# Patient Record
Sex: Female | Born: 1950 | Race: White | Hispanic: No | Smoking: Current every day smoker
Health system: Southern US, Community
[De-identification: ages and names within clinical notes are randomized; demographics above are authoritative.]

## PROBLEM LIST (undated history)

## (undated) DIAGNOSIS — D649 Anemia, unspecified: Secondary | ICD-10-CM

## (undated) DIAGNOSIS — J449 Chronic obstructive pulmonary disease, unspecified: Secondary | ICD-10-CM

## (undated) DIAGNOSIS — J45909 Unspecified asthma, uncomplicated: Secondary | ICD-10-CM

## (undated) DIAGNOSIS — Z87442 Personal history of urinary calculi: Secondary | ICD-10-CM

## (undated) DIAGNOSIS — M810 Age-related osteoporosis without current pathological fracture: Secondary | ICD-10-CM

## (undated) DIAGNOSIS — M1990 Unspecified osteoarthritis, unspecified site: Secondary | ICD-10-CM

## (undated) DIAGNOSIS — R112 Nausea with vomiting, unspecified: Secondary | ICD-10-CM

## (undated) DIAGNOSIS — I10 Essential (primary) hypertension: Secondary | ICD-10-CM

## (undated) DIAGNOSIS — Z9889 Other specified postprocedural states: Secondary | ICD-10-CM

## (undated) DIAGNOSIS — E785 Hyperlipidemia, unspecified: Secondary | ICD-10-CM

## (undated) HISTORY — DX: Hyperlipidemia, unspecified: E78.5

## (undated) HISTORY — PX: TUBAL LIGATION: SHX77

## (undated) HISTORY — DX: Essential (primary) hypertension: I10

## (undated) HISTORY — DX: Age-related osteoporosis without current pathological fracture: M81.0

## (undated) HISTORY — DX: Unspecified osteoarthritis, unspecified site: M19.90

## (undated) HISTORY — PX: HERNIA REPAIR: SHX51

---

## 2009-01-26 HISTORY — PX: CARPAL TUNNEL RELEASE: SHX101

## 2013-03-21 ENCOUNTER — Ambulatory Visit: Payer: Self-pay | Admitting: Gastroenterology

## 2013-04-07 ENCOUNTER — Emergency Department (HOSPITAL_COMMUNITY)
Admission: EM | Admit: 2013-04-07 | Discharge: 2013-04-07 | Disposition: A | Discharge status: Home / Self Care | Payer: BC Managed Care – PPO | Attending: Emergency Medicine | Admitting: Emergency Medicine

## 2013-04-07 ENCOUNTER — Emergency Department (HOSPITAL_COMMUNITY): Payer: BC Managed Care – PPO

## 2013-04-07 ENCOUNTER — Encounter (HOSPITAL_COMMUNITY): Payer: Self-pay | Admitting: Emergency Medicine

## 2013-04-07 DIAGNOSIS — R221 Localized swelling, mass and lump, neck: Principal | ICD-10-CM

## 2013-04-07 DIAGNOSIS — K148 Other diseases of tongue: Secondary | ICD-10-CM | POA: Insufficient documentation

## 2013-04-07 DIAGNOSIS — R220 Localized swelling, mass and lump, head: Secondary | ICD-10-CM | POA: Insufficient documentation

## 2013-04-07 DIAGNOSIS — Z79899 Other long term (current) drug therapy: Secondary | ICD-10-CM | POA: Insufficient documentation

## 2013-04-07 DIAGNOSIS — R1310 Dysphagia, unspecified: Secondary | ICD-10-CM | POA: Insufficient documentation

## 2013-04-07 DIAGNOSIS — F17200 Nicotine dependence, unspecified, uncomplicated: Secondary | ICD-10-CM | POA: Insufficient documentation

## 2013-04-07 MED ORDER — FAMOTIDINE 20 MG PO TABS
20.0000 mg | ORAL_TABLET | Freq: Once | ORAL | Status: AC
  Administered 2013-04-07: 20 mg via ORAL
  Filled 2013-04-07: qty 1

## 2013-04-07 MED ORDER — SODIUM CHLORIDE 0.9 % IJ SOLN
INTRAMUSCULAR | Status: AC
  Filled 2013-04-07: qty 800

## 2013-04-07 MED ORDER — DIPHENHYDRAMINE HCL 25 MG PO TABS: 25.0000 mg | ORAL_TABLET | Freq: Three times a day (TID) | ORAL | Status: DC

## 2013-04-07 MED ORDER — PREDNISONE 20 MG PO TABS: 60.0000 mg | ORAL_TABLET | Freq: Every day | ORAL | Status: AC

## 2013-04-07 MED ORDER — PREDNISONE 50 MG PO TABS
60.0000 mg | ORAL_TABLET | ORAL | Status: AC
  Administered 2013-04-07: 60 mg via ORAL
  Filled 2013-04-07 (×2): qty 1

## 2013-04-07 MED ORDER — FAMOTIDINE 20 MG PO TABS: 20.0000 mg | ORAL_TABLET | Freq: Two times a day (BID) | ORAL | Status: DC

## 2013-04-07 NOTE — ED Notes (Signed)
Pt alert & oriented x4, stable gait. Patient given discharge instructions, paperwork & prescription(s). Patient  instructed to stop at the registration desk to finish any additional paperwork. Patient verbalized understanding. Pt left department w/ no further questions. 

## 2013-04-07 NOTE — Discharge Instructions (Signed)
As discussed, your evaluation here is largely reassuring.  However, with the abnormality of your CT scan it is important that you follow up with our ENT specialist for visualization and confirmation of findings.  A clear precipitant for your tongue swelling was not identified.  You may elect to use over the counter versions of the provided medication (but follow the given instructions).  Please be sure to return here for any concerning changes in your condition.

## 2013-04-07 NOTE — ED Provider Notes (Signed)
CSN: [id number]     Arrival date & time 04/07/13  1536 History  This chart was scribed for Carmin Muskrat, MD by Elby Beck, ED Scribe. This patient was seen in room APA02/APA02 and the patient's care was started at 4:07 PM.  Chief Complaint  Patient presents with  . Oral Swelling    The history is provided by the patient. No language interpreter was used.    HPI Comments: Rhonda Briggs is a 63 y.o. female who presents to the Emergency Department complaining of right sided tongue swelling onset about 3.5 hours ago, at 12:30 PM. She states that she is having slight difficulty with swallowing and with talking in association with this swelling. She states that she is not having any difficulty breathing or handling secretions. She denies any recent trauma or change in activity to have onset this tongue swelling. She denies any history of similar symptoms. She reports that no one around her has been having similar tongue swelling. She states that the only other symptom she has been having recently is itching to plantar surfaces of her bilateral feet for 2 weeks. She reports that Benadryl has been relieving this temporarily, and that she last took Benadryl a few hours ago. She states that she is otherwise healthy, and that she only takes 1 regular medication for knee pain. She denies extremity weakness, chest pain, abdominal pain, confusion or any other symptoms. She states that she is a smoker, although she denies drinking. She reports that she has no known medication or food allergies.   History reviewed. No pertinent past medical history. Past Surgical History  Procedure Laterality Date  . Tubal ligation    . Hernia repair     History reviewed. No pertinent family history. History  Substance Use Topics  . Smoking status: Current Every Day Smoker  . Smokeless tobacco: Not on file  . Alcohol Use: No   OB History   Grav Para Term Preterm Abortions TAB SAB Ect Mult Living                  Review of Systems  Constitutional:       Per HPI, otherwise negative  HENT:       Per HPI, otherwise negative  Respiratory:       Per HPI, otherwise negative  Cardiovascular:       Per HPI, otherwise negative  Gastrointestinal: Negative for vomiting.  Endocrine:       Negative aside from HPI  Genitourinary:       Neg aside from HPI   Musculoskeletal:       Per HPI, otherwise negative  Skin: Negative.   Neurological: Negative for syncope.    Allergies  Review of patient's allergies indicates no known allergies.  Home Medications   Current Outpatient Rx  Name  Route  Sig  Dispense  Refill  . diphenhydrAMINE (BENADRYL) 25 MG tablet   Oral   Take 25 mg by mouth once as needed for allergies (FOR ALLERGIC REACTION).         . Misc Natural Products (FLEX-A-MIN DOUBLE STRENGTH) TABS   Oral   Take 1 tablet by mouth daily.         . naproxen sodium (ALEVE) 220 MG tablet   Oral   Take 220 mg by mouth daily as needed (FOR PAIN).           Triage Vitals: BP 147/61  Pulse 99  Temp(Src) 98.4 F (36.9 C)  Resp 20  SpO2 99%  Physical Exam  Nursing note and vitals reviewed. Constitutional: She is oriented to person, place, and time. She appears well-developed and well-nourished. No distress.  HENT:  Head: Normocephalic and atraumatic.  Enlagrement of tongue on right side. No erythema, drainage or bleeding.  Eyes: Conjunctivae and EOM are normal.  Neck: No tracheal deviation present. No thyromegaly present.  Cardiovascular: Normal rate and regular rhythm.   Pulmonary/Chest: Effort normal and breath sounds normal. No stridor. No respiratory distress.  Abdominal: She exhibits no distension.  Musculoskeletal: She exhibits no edema.  Neurological: She is alert and oriented to person, place, and time. She has normal reflexes. No cranial nerve deficit. Coordination normal.  Normal neuro exam. Equal grip strengths.  Skin: Skin is warm and dry.  Psychiatric: She has a  normal mood and affect.    ED Course  Procedures (including critical care time)  DIAGNOSTIC STUDIES: Oxygen Saturation is 99% on RA, normal by my interpretation.    COORDINATION OF CARE: 4:12 PM- Discussed plan to order Prednisone and Pepcid. Will also order CT's of pt's head and of the soft tissue in her neck. Pt advised of plan for treatment and pt agrees.  6:30 PM- Recheck with pt, and pt reports that her tongue swelling is improving, but that it has not fully resolved. Discussed normal CT findings, with the exception of a "gray area" on the left side of her neck. Advised pt to follow-up regarding this, especially since she is a smoker.  Medications  predniSONE (DELTASONE) tablet 60 mg (60 mg Oral Given 04/07/13 1624)  famotidine (PEPCID) tablet 20 mg (20 mg Oral Given 04/07/13 1624)  sodium chloride 0.9 % injection (  Duplicate [id number])   Labs Review Labs Reviewed - No data to display Imaging Review Ct Head Wo Contrast  04/07/2013   CLINICAL DATA:  Right-sided tongue swelling, difficulty swallowing  EXAM: CT HEAD WITHOUT CONTRAST  CT NECK WITHOUT CONTRAST  TECHNIQUE: Contiguous axial images were obtained from the base of the skull through the vertex without contrast. Multidetector CT imaging of the neck was performed using the standard protocol without intravenous contrast.  COMPARISON:  None.  FINDINGS: CT HEAD FINDINGS  No acute hemorrhage, infarct, or mass lesion is identified. No midline shift. Ventricles are normal in size. Orbits and paranasal sinuses are unremarkable. No skull fracture. Mild motion artifact degrades imaging at the skullbase. Right sphenoid sinus mucous retention cyst or polyp incidentally noted.  CT NECK FINDINGS  Dental amalgam produces streak artifact. There is asymmetric fullness in the left parapharyngeal tonsillar region, image 24. No significant mass effect upon the airway. No lymphadenopathy. Thyroid demonstrates a small retroesophageal component. Mild  apical emphysematous changes are noted. No acute osseous finding, lytic, or sclerotic lesion.  IMPRESSION: No acute intracranial abnormality.  Asymmetric prominence of the left parapharyngeal tonsillar tissue which could represent adenoid hypertrophy however squamous cell carcinoma could have a similar appearance especially in a patient with a smoking history. Direct visualization is recommended. No evidence for parapharyngeal abscess.  Apical emphysematous changes.   Electronically Signed   By: Conchita Paris M.D.   On: 04/07/2013 18:05   Ct Soft Tissue Neck Wo Contrast  04/07/2013   CLINICAL DATA:  Right-sided tongue swelling, difficulty swallowing  EXAM: CT HEAD WITHOUT CONTRAST  CT NECK WITHOUT CONTRAST  TECHNIQUE: Contiguous axial images were obtained from the base of the skull through the vertex without contrast. Multidetector CT imaging of the neck was performed using the standard protocol without  intravenous contrast.  COMPARISON:  None.  FINDINGS: CT HEAD FINDINGS  No acute hemorrhage, infarct, or mass lesion is identified. No midline shift. Ventricles are normal in size. Orbits and paranasal sinuses are unremarkable. No skull fracture. Mild motion artifact degrades imaging at the skullbase. Right sphenoid sinus mucous retention cyst or polyp incidentally noted.  CT NECK FINDINGS  Dental amalgam produces streak artifact. There is asymmetric fullness in the left parapharyngeal tonsillar region, image 24. No significant mass effect upon the airway. No lymphadenopathy. Thyroid demonstrates a small retroesophageal component. Mild apical emphysematous changes are noted. No acute osseous finding, lytic, or sclerotic lesion.  IMPRESSION: No acute intracranial abnormality.  Asymmetric prominence of the left parapharyngeal tonsillar tissue which could represent adenoid hypertrophy however squamous cell carcinoma could have a similar appearance especially in a patient with a smoking history. Direct visualization  is recommended. No evidence for parapharyngeal abscess.  Apical emphysematous changes.   Electronically Signed   By: Conchita Paris M.D.   On: 04/07/2013 18:05    I spent ~5 minutes discussing smoking cessation with the patient, particularly with her abnormal oropharyngeal CT.    MDM   Final diagnoses:  Tongue swelling    I personally performed the services described in this documentation, which was scribed in my presence. The recorded information has been reviewed and is accurate.  This patient presents with asymmetric her tongue swelling, and inability to fully laterally move the tongue. Patient's presentation is unusual for angioedema, with asymmetric, and given this, and the inability to have complete range of motion of the tongue, though some concern for either cranial nerve deficit or small CVA. Patient's evaluation is largely reassuring, though there is an area of uncertain consequence on the CT scan. Patient tongue swelling decreased, and she had no new symptoms.  After a lengthy conversation with her smoking cessation, follow up instructions, return precautions, she was discharged in stable condition.    Carmin Muskrat, MD 04/07/13 [phone]

## 2013-04-07 NOTE — ED Notes (Signed)
Tongue swelling, onset 1230p .    Recent itching of feet. No new foods or meds.

## 2014-12-10 ENCOUNTER — Encounter: Payer: Self-pay | Admitting: Internal Medicine

## 2014-12-18 ENCOUNTER — Ambulatory Visit: Payer: Self-pay | Admitting: Gastroenterology

## 2015-11-04 ENCOUNTER — Ambulatory Visit (INDEPENDENT_AMBULATORY_CARE_PROVIDER_SITE_OTHER): Payer: Medicare Other | Admitting: Otolaryngology

## 2015-11-04 DIAGNOSIS — D3701 Neoplasm of uncertain behavior of lip: Secondary | ICD-10-CM | POA: Diagnosis not present

## 2015-11-04 DIAGNOSIS — J351 Hypertrophy of tonsils: Secondary | ICD-10-CM

## 2015-12-09 ENCOUNTER — Ambulatory Visit: Payer: Self-pay | Admitting: Allergy and Immunology

## 2015-12-17 ENCOUNTER — Encounter: Payer: Self-pay | Admitting: Allergy & Immunology

## 2015-12-17 ENCOUNTER — Ambulatory Visit (INDEPENDENT_AMBULATORY_CARE_PROVIDER_SITE_OTHER): Payer: Medicare Other | Admitting: Allergy & Immunology

## 2015-12-17 ENCOUNTER — Encounter (INDEPENDENT_AMBULATORY_CARE_PROVIDER_SITE_OTHER): Payer: Self-pay

## 2015-12-17 VITALS — BP 152/92 | HR 96 | Temp 97.5°F | Ht 60.0 in | Wt 141.6 lb

## 2015-12-17 DIAGNOSIS — T783XXD Angioneurotic edema, subsequent encounter: Secondary | ICD-10-CM

## 2015-12-17 DIAGNOSIS — L299 Pruritus, unspecified: Secondary | ICD-10-CM | POA: Diagnosis not present

## 2015-12-17 DIAGNOSIS — T781XXD Other adverse food reactions, not elsewhere classified, subsequent encounter: Secondary | ICD-10-CM | POA: Diagnosis not present

## 2015-12-17 LAB — CBC WITH DIFFERENTIAL/PLATELET
BASOS ABS: 0 {cells}/uL (ref 0–200)
Basophils Relative: 0 %
Eosinophils Absolute: 176 cells/uL (ref 15–500)
Eosinophils Relative: 1 %
HCT: 37.2 % (ref 35.0–45.0)
Hemoglobin: 11.5 g/dL — ABNORMAL LOW (ref 11.7–15.5)
Lymphocytes Relative: 36 %
Lymphs Abs: 6336 cells/uL — ABNORMAL HIGH (ref 850–3900)
MCH: 21.1 pg — AB (ref 27.0–33.0)
MCHC: 30.9 g/dL — AB (ref 32.0–36.0)
MCV: 67.9 fL — ABNORMAL LOW (ref 80.0–100.0)
MONOS PCT: 6 %
MPV: 7.9 fL (ref 7.5–12.5)
Monocytes Absolute: 1056 cells/uL — ABNORMAL HIGH (ref 200–950)
Neutro Abs: 10032 cells/uL — ABNORMAL HIGH (ref 1500–7800)
Neutrophils Relative %: 57 %
PLATELETS: 666 10*3/uL — AB (ref 140–400)
RBC: 5.44 MIL/uL — AB (ref 3.80–5.10)
RDW: 18.3 % — ABNORMAL HIGH (ref 11.0–15.0)
WBC: 17.6 10*3/uL — ABNORMAL HIGH (ref 3.8–10.8)

## 2015-12-17 NOTE — Patient Instructions (Addendum)
1. Angioedema with itching - Testing today had some very mild positives to barley and ginger, however I do not think that these are related to your symptoms.  - The remainder of the testing for foods was negative.  - We will get lab work to rule out serious causes of swelling: complete blood count with differential, C1 esterase studies (to rule out hereditary angioedema), inflammatory markers (to rule out rheumatologic causes of swelling), and an alpha-gal panel (to rule out alpha-gal sensitivity). - In the meantime, we will try suppressing the reaction when it start with high dose antihistamines. - When the itching starts, take Allegra (fexofenadine) tablets 1-2 tablets in the morning and Zyrtec (cetirizine) 1-2 tablets at night for ONE WEEK to prevent the progression to swelling.   2. Return in about 3 months (around 03/18/2016).  Please inform us of any Emergency Department visits, hospitalizations, or changes in symptoms. Call us before going to the ED for breathing or allergy symptoms since we might be able to fit you in for a sick visit. Feel free to contact us anytime with any questions, problems, or concerns.  It was a pleasure to meet you today! Happy Thanksgiving!   Websites that have reliable patient information: 1. American Academy of Asthma, Allergy, and Immunology: www.aaaai.org 2. Food Allergy Research and Education (FARE): foodallergy.org 3. Mothers of Asthmatics: http://www.asthmacommunitynetwork.org 4. American College of Allergy, Asthma, and Immunology: www.acaai.org

## 2015-12-17 NOTE — Addendum Note (Signed)
Addended by: Clydene Laming, AMBER L on: 12/17/2015 12:06 PM   Modules accepted: Orders

## 2015-12-17 NOTE — Progress Notes (Addendum)
NEW PATIENT  Date of Service/Encounter:  12/17/15   Assessment:   Angioedema  Adverse food reaction  Itching    Plan/Recommendations:   1. Angioedema with itching but no rash - The history is largely reassuring with the presence of itching which responds to Benadryl. - With a histaminergic process, it is odd that there is no rash with the swelling. - Testing today had some very mild positives to barley and ginger, however I do not think that these are related to your symptoms.  - The remainder of the testing for foods was negative.  - We will get lab work to rule out serious causes of swelling: complete blood count with differential, C1 esterase studies (to rule out hereditary angioedema), inflammatory markers (to rule out rheumatologic causes of swelling), and an alpha-gal panel (to rule out alpha-gal sensitivity). - There is no family history of HAE, therefore this is less likely. - Acquired angioedema is a possibility in this age range, and with her history of smoking this is something we should rule out.  - In the meantime, we will try suppressing the reaction when it start with high dose antihistamines. - When the itching starts, take Allegra (fexofenadine) tablets 1-2 tablets in the morning and Zyrtec (cetirizine) 1-2 tablets at night for ONE WEEK to prevent the progression to swelling.  - PRN use of suppressive antihistamines seems reasonable since the reactions occur only every couple of months at the most.  - Rhonda Briggs does not seem like a patient who would like to take a daily medication for her swelling episodes, therefore we compromised with the above plan.   2. Return in about 3 months (around 03/18/2016).   Subjective:   Rhonda Briggs is a 65 y.o. female presenting today for evaluation of  Chief Complaint  Patient presents with  . New Evaluation    Allergy testing  .  Rhonda Briggs has a history of the following: There are no active problems to display  for this patient.   History obtained from: chart review and patient.  Rhonda Briggs was referred by [facility].      Rhonda Briggs is a 65 y.o. female presenting for tongue swelling and itching. The patient reports that she has had these episodes off and on for 4-5 years. They will start with intense itching of her skin. Swelling will ensue thereafter and nearly every time her tongue is involved. She has gone to the ED on multiple occasions for these episodes. She is typically treated with an H2 blocker as well as an H1 blocker and steroids. She was admitted on one occasion for observation. She has never been intubated for these episodes. They always occur in the middle of the night. She denies other systemic symptoms including vomiting, abdominal pain, diarrhea, or hives. The episodes last for 1-2 days before resolving on their own. She thinks that foods are involved, although she does not have a specific food in mind. The reaction occurred once with shrimp and another time with tomato exposure. However, she has had treatment with tomato these episodes without a problem. She tries managing these at home with Benadryl, which sometimes works. She has never need epinephrine for treatment, to her best knowledge. Of note, there is no family history of unexplained abdominal pain, swelling episodes, etc that would be consistent with HAE.    Rhonda Briggs had an ED visits from March 2015 when she was treated for tongue swelling. She was treated with prednisone  and Pepcid. She had a CT of her head and neck which noted an "asymmetric prominence of the left parapharyngeal tonsillar tissue which was thought to represent adenoidal hypertrophy versus squamous cell carcinoma. Smoking cessation was encouraged and she was discharged to home.  She has seen 3 separate ENT providers on 3 separate occasions. A laryngoscopy was performed at every visit, which was unrevealing. She denies unintentional weight  loss, night sweats, or other concerns.  Rhonda Briggs otherwise does not have any atopic disease. She does endorse some nasal congestion very rarely, but she does not take any medications for it. She denies rhinorrhea, itchy watery eyes, or other allergic rhinitis symptoms. She has needed and albuterol inhaler in the past for bronchitis, but does not remember the last time that she used it. She does have a chronic nighttime cough, which she attributes to her smoking history. She has never been diagnosed with COPD. She does not have significant sputum production.  Otherwise, there is no history of other atopic diseases, including drug allergies, environmental allergies, stinging insect allergies, or urticaria. There is no significant infectious history. Vaccinations are up to date.    Past Medical History: There are no active problems to display for this patient.   Medication List:    Medication List       Accurate as of 12/17/15 11:20 AM. Always use your most recent med list.          HYDROCHLOROTHIAZIDE PO Take 2.5 mg by mouth daily.   pramipexole 0.125 MG tablet Commonly known as:  MIRAPEX Take 0.125 mg by mouth daily.       Birth History: non-contributory. Born at term without complications.   Developmental History: Rhonda Briggs has met all milestones on time. She has required no speech therapy, occupational therapy, or physical therapy.   Past Surgical History: Past Surgical History:  Procedure Laterality Date  . CARPAL TUNNEL RELEASE  2011  . HERNIA REPAIR    . TUBAL LIGATION       Family History: Family History  Problem Relation Age of Onset  . Urticaria Neg Hx   . Eczema Neg Hx   . Immunodeficiency Neg Hx   . Atopy Neg Hx   . Asthma Neg Hx   . Angioedema Neg Hx   . Allergic rhinitis Neg Hx      Social History: Rhonda Briggs lives at home with her husband. They do have two grown daughters. They live in a [place] year old home. There is hardwood throughout the home. They have  electric heating and central cooling. There are dogs outside of the home, but not inside. There are roach and mildew issues at the home. She is a smoker for 50+ years. At one point, she was smoking around one pack per day but has decreased down to one half of a pack per day.   Review of Systems: a 14-point review of systems is pertinent for what is mentioned in HPI.  Otherwise, all other systems were negative. Constitutional: negative other than that listed in the HPI Eyes: negative other than that listed in the HPI Ears, nose, mouth, throat, and face: negative other than that listed in the HPI Respiratory: negative other than that listed in the HPI Cardiovascular: negative other than that listed in the HPI Gastrointestinal: negative other than that listed in the HPI Genitourinary: negative other than that listed in the HPI Integument: negative other than that listed in the HPI Hematologic: negative other than that listed in the HPI Musculoskeletal: negative other than  that listed in the HPI Neurological: negative other than that listed in the HPI Allergy/Immunologic: negative other than that listed in the HPI    Objective:   Blood pressure (!) 152/92, pulse 96, temperature 97.5 F (36.4 C), temperature source Oral, height 5' (1.524 m), weight 141 lb 9.6 oz (64.2 kg), SpO2 96 %. Body mass index is 27.65 kg/m.   Physical Exam:  General: Alert, interactive, in no acute distress.Cooperative with the exam. Somewhat unapproachable initially, but warmed up during the visit. HEENT: TMs pearly gray, turbinates edematous and pale with clear discharge, post-pharynx erythematous. Neck: Supple without thyromegaly. Adenopathy: no enlarged lymph nodes appreciated in the anterior cervical, occipital, axillary, epitrochlear, inguinal, or popliteal regions Lungs: Clear to auscultation without wheezing, rhonchi or rales. No increased work of breathing. CV: Normal S1/S2, no murmurs. Capillary refill  <2 seconds.  Abdomen: Nondistended, nontender. No guarding or rebound tenderness. Bowel sounds faint and present in all fields  Skin: Warm and dry, without lesions or rashes. Extremities:  No clubbing, cyanosis or edema. Neuro:   Grossly intact.  Diagnostic studies:   Allergy Studies:   Entire Foods Panel: equivocal reactions to ginger and barley with adequate controls.     Salvatore Marvel, MD [facility]

## 2015-12-18 LAB — C-REACTIVE PROTEIN: CRP: 8.1 mg/L — AB (ref <8.0)

## 2015-12-18 LAB — SEDIMENTATION RATE: Sed Rate: 12 mm/hr (ref 0–30)

## 2015-12-19 LAB — TRYPTASE: TRYPTASE: 10 ug/L (ref <11)

## 2015-12-20 LAB — C1 ESTERASE INHIBITOR: C1INH SerPl-mCnc: 60 mg/dL — ABNORMAL HIGH (ref 21–39)

## 2015-12-21 LAB — COMPLEMENT COMPONENT C1Q: Complement C1Q: 7.4 mg/dL (ref 5.0–8.6)

## 2015-12-21 LAB — C1 ESTERASE INHIBITOR, FUNCTIONAL

## 2015-12-25 LAB — ALPHA-GAL PANEL
Beef IgE: <0.1 kU/L (ref <0.35)
CLASS: 0
Class: 0
Class: 0
Lamb/Mutton IgE: <0.1 kU/L (ref <0.35)

## 2015-12-30 ENCOUNTER — Other Ambulatory Visit: Payer: Self-pay | Admitting: *Deleted

## 2015-12-30 DIAGNOSIS — T783XXD Angioneurotic edema, subsequent encounter: Secondary | ICD-10-CM

## 2016-03-09 ENCOUNTER — Emergency Department (HOSPITAL_COMMUNITY): Payer: Medicare Other

## 2016-03-09 ENCOUNTER — Emergency Department (HOSPITAL_COMMUNITY)
Admission: EM | Admit: 2016-03-09 | Discharge: 2016-03-09 | Disposition: A | Discharge status: Home / Self Care | Payer: Medicare Other | Attending: Emergency Medicine | Admitting: Emergency Medicine

## 2016-03-09 ENCOUNTER — Encounter (HOSPITAL_COMMUNITY): Payer: Self-pay | Admitting: Emergency Medicine

## 2016-03-09 DIAGNOSIS — Z79899 Other long term (current) drug therapy: Secondary | ICD-10-CM | POA: Diagnosis not present

## 2016-03-09 DIAGNOSIS — M25561 Pain in right knee: Secondary | ICD-10-CM

## 2016-03-09 DIAGNOSIS — G8929 Other chronic pain: Secondary | ICD-10-CM | POA: Diagnosis not present

## 2016-03-09 DIAGNOSIS — M25531 Pain in right wrist: Secondary | ICD-10-CM

## 2016-03-09 DIAGNOSIS — F17200 Nicotine dependence, unspecified, uncomplicated: Secondary | ICD-10-CM | POA: Insufficient documentation

## 2016-03-09 DIAGNOSIS — M25562 Pain in left knee: Secondary | ICD-10-CM

## 2016-03-09 DIAGNOSIS — M170 Bilateral primary osteoarthritis of knee: Secondary | ICD-10-CM | POA: Diagnosis not present

## 2016-03-09 MED ORDER — DEXAMETHASONE SODIUM PHOSPHATE 10 MG/ML IJ SOLN
6.0000 mg | Freq: Once | INTRAMUSCULAR | Status: AC
  Administered 2016-03-09: 6 mg via INTRAMUSCULAR
  Filled 2016-03-09: qty 1

## 2016-03-09 MED ORDER — HYDROCODONE-ACETAMINOPHEN 5-325 MG PO TABS: 2.0000 | ORAL_TABLET | ORAL | 0 refills | Status: DC | PRN

## 2016-03-09 NOTE — ED Triage Notes (Signed)
Pt reports chronic right knee pain for "a while" and now having right wrist pain with no injury.

## 2016-03-09 NOTE — Discharge Instructions (Addendum)
For severe pain take norco or vicodin however realize they have the potential for addiction and it can make you sleepy and has tylenol in it.  No operating machinery while taking.  If you were given medicines take as directed.  If you are on coumadin or contraceptives realize their levels and effectiveness is altered by many different medicines.  If you have any reaction (rash, tongues swelling, other) to the medicines stop taking and see a physician.    If your blood pressure was elevated in the ER make sure you follow up for management with a primary doctor or return for chest pain, shortness of breath or stroke symptoms.  Please follow up as directed and return to the ER or see a physician for new or worsening symptoms.  Thank you. Vitals:   03/09/16 0924 03/09/16 0926  BP:  118/56  Pulse:  103  Resp:  18  Temp:  98.1 F (36.7 C)  TempSrc:  Oral  SpO2:  100%  Weight: 148 lb (67.1 kg)   Height: 5\' 1"  (1.549 m)

## 2016-03-09 NOTE — ED Provider Notes (Signed)
[name] Provider Note   CSN: [id number] Arrival date & time: 03/09/16  [id number]   By signing my name below, I, Rhonda Briggs, attest that this documentation has been prepared under the direction and in the presence of Rhonda Morrison, MD. Electronically Signed: Hilbert Briggs, Scribe. 03/09/16. 1:00 PM. History   Chief Complaint Chief Complaint  Patient presents with  . Knee Pain  . Wrist Pain     The history is provided by the patient. No language interpreter was used.   HPI Comments: Rhonda Briggs is a 66 y.o. female who presents to the Emergency Department complaining of right wrist pain that began earlier today. Patient states that she typically has chronic bilateral knee pain and has been using her wrist to hold herself up. She believes this could be the reason for her sudden onset of pain. She states that the pain in her knees are worse when standing. The left knee is worse than the right. She denies any pain when laying down. She denies fever, chills, and headache. She has a hx of arthritis which she takes celebrex.   History reviewed. No pertinent past medical history.  There are no active problems to display for this patient.   Past Surgical History:  Procedure Laterality Date  . CARPAL TUNNEL RELEASE  2011  . HERNIA REPAIR    . TUBAL LIGATION      OB History    No data available       Home Medications    Prior to Admission medications   Medication Sig Start Date End Date Taking? Authorizing Provider  celecoxib (CELEBREX) 200 MG capsule Take 100 mg by mouth daily. Take with food.   Yes Historical Provider, MD  hydrochlorothiazide (HYDRODIURIL) 25 MG tablet Take 25 mg by mouth daily.    Yes Historical Provider, MD  pramipexole (MIRAPEX) 0.125 MG tablet Take 0.25 mg by mouth at bedtime.    Yes Historical Provider, MD    Family History Family History  Problem Relation Age of Onset  . Urticaria Neg Hx   . Eczema Neg Hx   . Immunodeficiency Neg Hx    . Atopy Neg Hx   . Asthma Neg Hx   . Angioedema Neg Hx   . Allergic rhinitis Neg Hx     Social History Social History  Substance Use Topics  . Smoking status: Current Every Day Smoker  . Smokeless tobacco: Never Used  . Alcohol use No     Allergies   Patient has no known allergies.   Review of Systems Review of Systems  Constitutional: Negative for chills and fever.  Musculoskeletal: Positive for arthralgias (Left knee, right knee, and right wrist.).  Neurological: Negative for numbness and headaches.  All other systems reviewed and are negative.    Physical Exam Updated Vital Signs BP (!) 117/53 (BP Location: Right Arm)   Pulse 83   Temp 98.1 F (36.7 C) (Oral)   Resp 20   Ht 5\' 1"  (1.549 m)   Wt 148 lb (67.1 kg)   SpO2 99%   BMI 27.96 kg/m   Physical Exam  Constitutional: She is oriented to person, place, and time. She appears well-developed and well-nourished.  HENT:  Head: Normocephalic and atraumatic.  Eyes: EOM are normal.  Neck: Neck supple.  Cardiovascular: Normal rate and regular rhythm.   Pulmonary/Chest: Effort normal.  Musculoskeletal: She exhibits no deformity.  Minimal to mild joint effusion at the right and left knee. No erythema noted. Right wrist pain is  worse with extension. No significant effusion. No erythema noted. No deformities noted.  Neurological: She is alert and oriented to person, place, and time.  Skin: Skin is warm and dry.  Psychiatric: She has a normal mood and affect.  Nursing note and vitals reviewed.    ED Treatments / Results  DIAGNOSTIC STUDIES: Oxygen Saturation is 100% on RA, normal by my interpretation.    COORDINATION OF CARE: 11:50 AM Discussed treatment plan with pt at bedside, which includes left and right knee X-ray, and pt agreed to plan.  Labs (all labs ordered are listed, but only abnormal results are displayed) Labs Reviewed - No data to display  EKG  EKG Interpretation None        Radiology Dg Knee 2 Views Left  Result Date: 03/09/2016 CLINICAL DATA:  Chronic bilateral knee pain/no known injury/pt states left is worse than right EXAM: LEFT KNEE - 1-2 VIEW COMPARISON:  None. FINDINGS: Narrowing of the articular cartilage most marked in the lateral compartment with some subchondral sclerosis in the lateral femoral condyles and tibial plateau. Small subchondral cystic lucencies about the lateral and patellofemoral compartments. No fracture. No dislocation. No effusion. Normal mineralization and alignment. IMPRESSION: 1. Tricompartmental degenerative change most marked in the lateral compartment. No fracture or other acute finding Electronically Signed   By: Lucrezia Europe M.D.   On: 03/09/2016 12:38   Dg Knee 2 Views Right  Result Date: 03/09/2016 CLINICAL DATA:  Chronic bilateral knee pain/no known injury/pt states left is worse than right EXAM: RIGHT KNEE - 1-2 VIEW COMPARISON:  None. FINDINGS: No fracture. No dislocation. Small effusion in the suprapatellar bursa. Narrowing of the articular cartilage most marked the lateral compartment. Small subchondral cystic lucencies in the lateral femoral condyles, lateral tibial plateau, and about the patellofemoral joint. Normal mineralization and alignment. IMPRESSION: 1. Negative for fracture or dislocation. 2. Tricompartmental degenerative change most marked in the lateral and patellar femoral compartments. 3. Small effusion. Electronically Signed   By: Lucrezia Europe M.D.   On: 03/09/2016 12:39    Procedures Procedures (including critical care time)  Medications Ordered in ED Medications  dexamethasone (DECADRON) injection 6 mg (6 mg Intramuscular Given 03/09/16 1153)     Initial Impression / Assessment and Plan / ED Course  I have reviewed the triage vital signs and the nursing notes.  Pertinent labs & imaging results that were available during my care of the patient were reviewed by me and considered in my medical decision  making (see chart for details).   patient presents with worsening acute on chronic knee pain bilateral. Patient is waiting to get into orthopedics. Patient's had injections in the past. No sign of infection on exam. X-rays confirm worsening arthritis. Discussed outpatient follow-up.  Results and differential diagnosis were discussed with the patient/parent/guardian. Xrays were independently reviewed by myself.  Close follow up outpatient was discussed, comfortable with the plan.   Medications  dexamethasone (DECADRON) injection 6 mg (6 mg Intramuscular Given 03/09/16 1153)    Vitals:   03/09/16 0924 03/09/16 0926 03/09/16 1259  BP:  118/56 (!) 117/53  Pulse:  103 83  Resp:  18 20  Temp:  98.1 F (36.7 C) 98.1 F (36.7 C)  TempSrc:  Oral Oral  SpO2:  100% 99%  Weight: 148 lb (67.1 kg)    Height: 5\' 1"  (1.549 m)      Final diagnoses:  Acute pain of right wrist  Chronic pain of both knees  Osteoarthritis of both knees, unspecified  osteoarthritis type     Final Clinical Impressions(s) / ED Diagnoses   Final diagnoses:  Acute pain of right wrist  Chronic pain of both knees  Osteoarthritis of both knees, unspecified osteoarthritis type    New Prescriptions New Prescriptions   No medications on file     Rhonda Morrison, MD 03/09/16 1318

## 2016-03-17 ENCOUNTER — Ambulatory Visit: Payer: Medicare Other | Admitting: Allergy & Immunology

## 2016-03-25 ENCOUNTER — Ambulatory Visit (INDEPENDENT_AMBULATORY_CARE_PROVIDER_SITE_OTHER): Payer: Medicare Other | Admitting: Orthopaedic Surgery

## 2016-03-25 ENCOUNTER — Ambulatory Visit (INDEPENDENT_AMBULATORY_CARE_PROVIDER_SITE_OTHER): Payer: Medicare Other

## 2016-03-25 ENCOUNTER — Encounter: Payer: Self-pay | Admitting: Orthopaedic Surgery

## 2016-03-25 VITALS — BP 146/72 | HR 92 | Temp 97.9°F | Ht 60.0 in | Wt 148.0 lb

## 2016-03-25 DIAGNOSIS — M25531 Pain in right wrist: Secondary | ICD-10-CM

## 2016-03-25 DIAGNOSIS — G8929 Other chronic pain: Secondary | ICD-10-CM | POA: Diagnosis not present

## 2016-03-25 DIAGNOSIS — M25561 Pain in right knee: Secondary | ICD-10-CM | POA: Diagnosis not present

## 2016-03-25 DIAGNOSIS — M25562 Pain in left knee: Secondary | ICD-10-CM

## 2016-03-25 MED ORDER — HYDROCODONE-ACETAMINOPHEN 5-325 MG PO TABS: ORAL_TABLET | ORAL | 0 refills | Status: DC

## 2016-03-25 NOTE — Progress Notes (Signed)
Subjective:    Patient ID: Rhonda Briggs, female    DOB: 06-11-1950, 66 y.o.   MRN: [id number]  HPI She has long history of bilateral knee pain.  She has had viscosupplementation in the past in [place] and did well.  She recently went to [facility] in [place] and had further injections which did not help.  She has tricompartmental degenerative arthritis of the knees more laterally.  She has no trauma.  She is taking Celebrex which helps some.  Her knees are getting worse.  She has pain when getting up from a seated position and has swelling.  She is developing a knock knee appearance of the right knee.  She has no redness or any trauma.  She has recently begun to have pain of the right wrist.  She was recently seen in the ER for this.  No x-rays were done.  She has more pain in the mornings.  She has no numbness of the wrist or hand.   Review of Systems  HENT: Negative for congestion.   Respiratory: Negative for cough and shortness of breath.   Cardiovascular: Negative for chest pain and leg swelling.  Endocrine: Positive for cold intolerance.  Musculoskeletal: Positive for arthralgias, gait problem and joint swelling.  Allergic/Immunologic: Positive for environmental allergies.   Past Medical History:  Diagnosis Date  . Arthritis   . Hypertension   . Osteoporosis     Past Surgical History:  Procedure Laterality Date  . CARPAL TUNNEL RELEASE  2011  . HERNIA REPAIR    . TUBAL LIGATION      Current Outpatient Prescriptions on File Prior to Visit  Medication Sig Dispense Refill  . celecoxib (CELEBREX) 200 MG capsule Take 100 mg by mouth daily. Take with food.    . hydrochlorothiazide (HYDRODIURIL) 25 MG tablet Take 25 mg by mouth daily.     Marland Kitchen HYDROcodone-acetaminophen (NORCO) 5-325 MG tablet Take 2 tablets by mouth every 4 (four) hours as needed. 6 tablet 0  . pramipexole (MIRAPEX) 0.125 MG tablet Take 0.25 mg by mouth at bedtime.      No current facility-administered  medications on file prior to visit.     Social History   Social History  . Marital status: Married    Spouse name: N/A  . Number of children: N/A  . Years of education: N/A   Occupational History  . Not on file.   Social History Main Topics  . Smoking status: Current Every Day Smoker  . Smokeless tobacco: Never Used  . Alcohol use No  . Drug use: No  . Sexual activity: Yes    Birth control/ protection: Surgical   Other Topics Concern  . Not on file   Social History Narrative  . No narrative on file    Family History  Problem Relation Age of Onset  . Lung disease Mother   . Cancer Father   . Urticaria Neg Hx   . Eczema Neg Hx   . Immunodeficiency Neg Hx   . Atopy Neg Hx   . Asthma Neg Hx   . Angioedema Neg Hx   . Allergic rhinitis Neg Hx     BP (!) 146/72   Pulse 92   Temp 97.9 F (36.6 C)   Ht 5' (1.524 m)   Wt 148 lb (67.1 kg)   BMI 28.90 kg/m      Objective:   Physical Exam  Constitutional: She is oriented to person, place, and time. She appears well-developed and well-nourished.  HENT:  Head: Normocephalic and atraumatic.  Eyes: Conjunctivae and EOM are normal. Pupils are equal, round, and reactive to light.  Neck: Normal range of motion. Neck supple.  Cardiovascular: Normal rate, regular rhythm and intact distal pulses.   Pulmonary/Chest: Effort normal.  Abdominal: Soft.  Musculoskeletal: She exhibits tenderness (Both knees tender, right more than left, knock knee appearance of right knee, mild effusion both knees, effusion, knees stable, ROM 0-100 right; 0-105 left.  Limp right.  Right wrist tender, dorsiflexion 15, volar 25, no redness.  NV intact.).  Neurological: She is alert and oriented to person, place, and time. She displays normal reflexes. No cranial nerve deficit. She exhibits normal muscle tone. Coordination normal.  Skin: Skin is warm and dry.  Psychiatric: She has a normal mood and affect. Her behavior is normal. Judgment and thought  content normal.  Vitals reviewed.   X-rays of the right wrist were done, reported separately.      Assessment & Plan:   Encounter Diagnoses  Name Primary?  . Right wrist pain Yes  . Chronic pain of right knee   . Chronic pain of left knee    I will have her see Dr. Aline Brochure for possible surgical evaluation.  She understands and agrees.  She is to continue her Celebrex.  She should get a cane or crutch.  Call if any problem.  Precautions given.  She already has a cock-up splint for the wrist.  I will give some pain medicine.  I have reviewed the [facility] web site prior to prescribing narcotic medicine for this patient.  Electronically Signed Sanjuana Kava, MD 2/28/20182:37 PM

## 2016-03-26 ENCOUNTER — Telehealth: Payer: Self-pay

## 2016-03-26 NOTE — Telephone Encounter (Signed)
Dr Luna Glasgow is sending this pt to you for TKA surgery.  When would you like to schedule this patient?

## 2016-03-27 NOTE — Telephone Encounter (Signed)
23, 26, 27 of march look open

## 2016-04-17 ENCOUNTER — Ambulatory Visit (INDEPENDENT_AMBULATORY_CARE_PROVIDER_SITE_OTHER): Payer: Medicare Other

## 2016-04-17 ENCOUNTER — Ambulatory Visit (INDEPENDENT_AMBULATORY_CARE_PROVIDER_SITE_OTHER): Payer: Medicare Other | Admitting: Orthopedic Surgery

## 2016-04-17 VITALS — BP 135/89 | HR 124 | Ht 61.0 in | Wt 145.0 lb

## 2016-04-17 DIAGNOSIS — M170 Bilateral primary osteoarthritis of knee: Secondary | ICD-10-CM

## 2016-04-17 DIAGNOSIS — M25562 Pain in left knee: Secondary | ICD-10-CM

## 2016-04-17 DIAGNOSIS — M25561 Pain in right knee: Secondary | ICD-10-CM

## 2016-04-17 DIAGNOSIS — F17219 Nicotine dependence, cigarettes, with unspecified nicotine-induced disorders: Secondary | ICD-10-CM

## 2016-04-17 DIAGNOSIS — G8929 Other chronic pain: Secondary | ICD-10-CM

## 2016-04-17 MED ORDER — NICOTINE 14 MG/24HR TD PT24: 14.0000 mg | MEDICATED_PATCH | Freq: Every day | TRANSDERMAL | 0 refills | Status: DC

## 2016-04-17 NOTE — Patient Instructions (Addendum)
You have decided to proceed with knee replacement surgery. You have decided not to continue with nonoperative measures such as but not limited to oral medication, weight loss, activity modification, physical therapy, bracing, or injection.  We will perform the procedure commonly known as total knee replacement. Some of the risks associated with knee replacement surgery include but are not limited to Bleeding Infection Swelling Stiffness Blood clot Pain that persists even after surgery  Infection is especially devastating complication of knee surgery although rare. If infection does occur your implant will usually have to be removed and several surgeries and antibiotics will be needed to eradicate the infection prior to performing a repeat replacement.   In some cases amputation is required to eradicate the infection. In other rare cases a knee fusion is needed    If you're not comfortable with these risks and would like to continue with nonoperative treatment please let Dr. Aline Brochure know prior to your surgery.   STOP SMOKING !  RISKS OF COMPLICATIONS INCREASE WITH SMOKING    Health Risks of Smoking Smoking cigarettes is very bad for your health. Tobacco smoke has over 200 known poisons in it. It contains the poisonous gases nitrogen oxide and carbon monoxide. There are over 60 chemicals in tobacco smoke that cause cancer. Smoking is difficult to quit because a chemical in tobacco, called nicotine, causes addiction or dependence. When you smoke and inhale, nicotine is absorbed rapidly into the bloodstream through your lungs. Both inhaled and non-inhaled nicotine may be addictive. What are the risks of cigarette smoke? Cigarette smokers have an increased risk of many serious medical problems, including:  Lung cancer.  Lung disease, such as pneumonia, bronchitis, and emphysema.  Chest pain (angina) and heart attack because the heart is not getting enough oxygen.  Heart disease and  peripheral blood vessel disease.  High blood pressure (hypertension).  Stroke.  Oral cancer, including cancer of the lip, mouth, or voice box.  Bladder cancer.  Pancreatic cancer.  Cervical cancer.  Pregnancy complications, including premature birth.  Stillbirths and smaller newborn babies, birth defects, and genetic damage to sperm.  Early menopause.  Lower estrogen level for women.  Infertility.  Facial wrinkles.  Blindness.  Increased risk of broken bones (fractures).  Senile dementia.  Stomach ulcers and internal bleeding.  Delayed wound healing and increased risk of complications during surgery.  Even smoking lightly shortens your life expectancy by several years. Because of secondhand smoke exposure, children of smokers have an increased risk of the following:  Sudden infant death syndrome (SIDS).  Respiratory infections.  Lung cancer.  Heart disease.  Ear infections. What are the benefits of quitting? There are many health benefits of quitting smoking. Here are some of them:  Within days of quitting smoking, your risk of having a heart attack decreases, your blood flow improves, and your lung capacity improves. Blood pressure, pulse rate, and breathing patterns start returning to normal soon after quitting.  Within months, your lungs may clear up completely.  Quitting for 10 years reduces your risk of developing lung cancer and heart disease to almost that of a nonsmoker.  People who quit may see an improvement in their overall quality of life. How do I quit smoking? Smoking is an addiction with both physical and psychological effects, and longtime habits can be hard to change. Your health care provider can recommend:  Programs and community resources, which may include group support, education, or talk therapy.  Prescription medicines to help reduce cravings.  Nicotine  replacement products, such as patches, gum, and nasal sprays. Use these  products only as directed. Do not replace cigarette smoking with electronic cigarettes, which are commonly called e-cigarettes. The safety of e-cigarettes is not known, and some may contain harmful chemicals.  A combination of two or more of these methods. Where to find more information:  American Lung Association: www.lung.org  American Cancer Society: www.cancer.org Summary  Smoking cigarettes is very bad for your health. Cigarette smokers have an increased risk of many serious medical problems, including several cancers, heart disease, and stroke.  Smoking is an addiction with both physical and psychological effects, and longtime habits can be hard to change.  By stopping right away, you can greatly reduce the risk of medical problems for you and your family.  To help you quit smoking, your health care provider can recommend programs, community resources, prescription medicines, and nicotine replacement products such as patches, gum, and nasal sprays. This information is not intended to replace advice given to you by your health care provider. Make sure you discuss any questions you have with your health care provider. Document Released: 02/20/2004 Document Revised: 01/17/2016 Document Reviewed: 01/17/2016 Elsevier Interactive Patient Education  2017 [employer].  Steps to Quit Smoking Smoking tobacco can be harmful to your health and can affect almost every organ in your body. Smoking puts you, and those around you, at risk for developing many serious chronic diseases. Quitting smoking is difficult, but it is one of the best things that you can do for your health. It is never too late to quit. What are the benefits of quitting smoking? When you quit smoking, you lower your risk of developing serious diseases and conditions, such as:  Lung cancer or lung disease, such as COPD.  Heart disease.  Stroke.  Heart attack.  Infertility.  Osteoporosis and bone  fractures. Additionally, symptoms such as coughing, wheezing, and shortness of breath may get better when you quit. You may also find that you get sick less often because your body is stronger at fighting off colds and infections. If you are pregnant, quitting smoking can help to reduce your chances of having a baby of low birth weight. How do I get ready to quit? When you decide to quit smoking, create a plan to make sure that you are successful. Before you quit:  Pick a date to quit. Set a date within the next two weeks to give you time to prepare.  Write down the reasons why you are quitting. Keep this list in places where you will see it often, such as on your bathroom mirror or in your car or wallet.  Identify the people, places, things, and activities that make you want to smoke (triggers) and avoid them. Make sure to take these actions:  Throw away all cigarettes at home, at work, and in your car.  Throw away smoking accessories, such as Scientist, research (medical).  Clean your car and make sure to empty the ashtray.  Clean your home, including curtains and carpets.  Tell your family, friends, and coworkers that you are quitting. Support from your loved ones can make quitting easier.  Talk with your health care provider about your options for quitting smoking.  Find out what treatment options are covered by your health insurance. What strategies can I use to quit smoking? Talk with your healthcare provider about different strategies to quit smoking. Some strategies include:  Quitting smoking altogether instead of gradually lessening how much you smoke over a  period of time. Research shows that quitting "cold [place]" is more successful than gradually quitting.  Attending in-person counseling to help you build problem-solving skills. You are more likely to have success in quitting if you attend several counseling sessions. Even short sessions of 10 minutes can be effective.  Finding  resources and support systems that can help you to quit smoking and remain smoke-free after you quit. These resources are most helpful when you use them often. They can include:  Online chats with a Social worker.  Telephone quitlines.  Printed Furniture conservator/restorer.  Support groups or group counseling.  Text messaging programs.  Mobile phone applications.  Taking medicines to help you quit smoking. (If you are pregnant or breastfeeding, talk with your health care provider first.) Some medicines contain nicotine and some do not. Both types of medicines help with cravings, but the medicines that include nicotine help to relieve withdrawal symptoms. Your health care provider may recommend:  Nicotine patches, gum, or lozenges.  Nicotine inhalers or sprays.  Non-nicotine medicine that is taken by mouth. Talk with your health care provider about combining strategies, such as taking medicines while you are also receiving in-person counseling. Using these two strategies together makes you more likely to succeed in quitting than if you used either strategy on its own. If you are pregnant or breastfeeding, talk with your health care provider about finding counseling or other support strategies to quit smoking. Do not take medicine to help you quit smoking unless told to do so by your health care provider. What things can I do to make it easier to quit? Quitting smoking might feel overwhelming at first, but there is a lot that you can do to make it easier. Take these important actions:  Reach out to your family and friends and ask that they support and encourage you during this time. Call telephone quitlines, reach out to support groups, or work with a counselor for support.  Ask people who smoke to avoid smoking around you.  Avoid places that trigger you to smoke, such as bars, parties, or smoke-break areas at work.  Spend time around people who do not smoke.  Lessen stress in your life, because  stress can be a smoking trigger for some people. To lessen stress, try:  Exercising regularly.  Deep-breathing exercises.  Yoga.  Meditating.  Performing a body scan. This involves closing your eyes, scanning your body from head to toe, and noticing which parts of your body are particularly tense. Purposefully relax the muscles in those areas.  Download or purchase mobile phone or tablet apps (applications) that can help you stick to your quit plan by providing reminders, tips, and encouragement. There are many free apps, such as QuitGuide from the [employer] Office manager for Disease Control and Prevention). You can find other support for quitting smoking (smoking cessation) through smokefree.gov and other websites. How will I feel when I quit smoking? Within the first 24 hours of quitting smoking, you may start to feel some withdrawal symptoms. These symptoms are usually most noticeable 2-3 days after quitting, but they usually do not last beyond 2-3 weeks. Changes or symptoms that you might experience include:  Mood swings.  Restlessness, anxiety, or irritation.  Difficulty concentrating.  Dizziness.  Strong cravings for sugary foods in addition to nicotine.  Mild weight gain.  Constipation.  Nausea.  Coughing or a sore throat.  Changes in how your medicines work in your body.  A depressed mood.  Difficulty sleeping (insomnia). After the first  2-3 weeks of quitting, you may start to notice more positive results, such as:  Improved sense of smell and taste.  Decreased coughing and sore throat.  Slower heart rate.  Lower blood pressure.  Clearer skin.  The ability to breathe more easily.  Fewer sick days. Quitting smoking is very challenging for most people. Do not get discouraged if you are not successful the first time. Some people need to make many attempts to quit before they achieve long-term success. Do your best to stick to your quit plan, and talk with your health  care provider if you have any questions or concerns. This information is not intended to replace advice given to you by your health care provider. Make sure you discuss any questions you have with your health care provider. Document Released: 01/06/2001 Document Revised: 09/10/2015 Document Reviewed: 05/29/2014 Elsevier Interactive Patient Education  2017 [employer].

## 2016-04-17 NOTE — Progress Notes (Signed)
Patient ID: Rhonda Briggs, female   DOB: 1950-12-15, 66 y.o.   MRN: [id number]  Chief Complaint  Patient presents with  . Knee Pain    Referral from Dr. Luna Glasgow for surgery.    HPI Rhonda Briggs is a 66 y.o. female.   Patient presents for possible knee replacement  The patient has had pain in her knees for several years. She was initially treated with physical supplementation did well then went to [facility] clinic in [place] had injections which did not help  Her main trouble is pain Celebrex does not relieve the pain her pain is getting worse most of her pain comes from getting out of a seated position and it is associated with some swelling it is bilateral pain  She had an acute episode of severe body ache treated with steroid injection and Percocet on the 19th.  She does not have any back pain or radicular symptoms Review of Systems Review of Systems  Respiratory: Positive for shortness of breath.   Cardiovascular: Negative for chest pain.  Musculoskeletal: Positive for arthralgias and myalgias. Negative for back pain.   (2 MINIMUM)  Past Medical History:  Diagnosis Date  . Arthritis   . Hypertension   . Osteoporosis     Past Surgical History:  Procedure Laterality Date  . CARPAL TUNNEL RELEASE  2011  . HERNIA REPAIR    . TUBAL LIGATION      Social History Social History  Substance Use Topics  . Smoking status: Current Every Day Smoker  . Smokeless tobacco: Never Used  . Alcohol use No    No Known Allergies  No outpatient prescriptions have been marked as taking for the 04/17/16 encounter (Office Visit) with Carole Civil, MD.      Physical Exam Physical Exam 1.BP 135/89   Pulse (!) 124   Ht 5\' 1"  (1.549 m)   Wt 145 lb (65.8 kg)   BMI 27.40 kg/m   2. Gen. appearance. The patient is well-developed and well-nourished, grooming and hygiene are normal. There are no gross congenital abnormalities  3. The patient is alert and oriented to person  place and time  4. Mood and affect are normal  5. Ambulation MILD LIMP   Examination reveals the following: 6. On inspection we find that the right knee and the left knee medial and lateral joint line tenderness  7. With the range of motion of  left knee 125 right knee 120 with 5 flexion contracture in each  8. Stability tests were normal  in each knee  9. Strength tests revealed grade 5 motor strength in each quad  10. Skin we find no rash ulceration or erythema in the right and left leg  11. Sensation remains intact right and left leg  12 Vascular system shows no peripheral edema right and left leg    MEDICAL DECISION MAKING:    Data Reviewed Initial plain films show bilateral symmetric to slightly more lateral compartment disease  Patellofemoral x-rays today which were not included in the first series of films show osteopenia with degenerative arthrosis  Assessment Encounter Diagnoses  Name Primary?  . Chronic pain of left knee   . Chronic pain of right knee   . Primary osteoarthritis of both knees Yes  . Cigarette nicotine dependence with nicotine-induced disorder      Plan  Right total knee. We will wait until 30 days passed from the steroid use and she will also attempt smoking cessation This procedure has been fully reviewed  with the patient and written informed consent has been obtained.    Arther Abbott 04/17/2016, 9:53 AM

## 2016-04-17 NOTE — Addendum Note (Signed)
Addended by: Baldomero Lamy B on: 04/17/2016 10:52 AM   Modules accepted: Orders, SmartSet

## 2016-04-20 ENCOUNTER — Telehealth: Payer: Self-pay | Admitting: Orthopedic Surgery

## 2016-04-20 ENCOUNTER — Other Ambulatory Visit: Payer: Self-pay | Admitting: Orthopedic Surgery

## 2016-04-20 MED ORDER — HYDROCODONE-ACETAMINOPHEN 5-325 MG PO TABS: ORAL_TABLET | ORAL | 0 refills | Status: DC

## 2016-04-20 NOTE — Telephone Encounter (Signed)
ready

## 2016-04-20 NOTE — Progress Notes (Unsigned)
[place] controlled substance reporting system reviewed  

## 2016-04-20 NOTE — Telephone Encounter (Signed)
Routing to Dr Harrison 

## 2016-04-20 NOTE — Telephone Encounter (Signed)
Patient left message on voicemail to ask Dr. Aline Brochure if he would prescribe her something for pain. She thought he had told her he was going to send something in to her pharmacy along with the nicotine patch.  Please advise.

## 2016-04-20 NOTE — Telephone Encounter (Signed)
She said she had hydrocodone   If not I will prescribe some for her

## 2016-04-20 NOTE — Telephone Encounter (Signed)
PLEASE ADVISE.

## 2016-04-20 NOTE — Telephone Encounter (Signed)
Patient states she does not have any and would like a prescription

## 2016-04-30 ENCOUNTER — Other Ambulatory Visit: Payer: Self-pay | Admitting: *Deleted

## 2016-04-30 DIAGNOSIS — Z96651 Presence of right artificial knee joint: Secondary | ICD-10-CM

## 2016-05-04 ENCOUNTER — Telehealth: Payer: Self-pay | Admitting: Orthopedic Surgery

## 2016-05-04 NOTE — Telephone Encounter (Signed)
HOLD ON SCHEDULING UNTIL THIS CAN BE DONE

## 2016-05-04 NOTE — Telephone Encounter (Signed)
Patient stated that she is having knee surgery on 4/19 and her husband was not able to look after her after surgery because of health problems. She stated that she had spoken with Medicare and was informed that her doctor would need to send a pre-authorization and reason why she would need rehab and home health.  Please advise

## 2016-05-04 NOTE — Telephone Encounter (Signed)
SHORT STAY ADVISED TO CANCEL SURGERY FOR TIME BEING, PATIENT AWARE  JUST SENDING BACK TO YOU AS A REMINDER

## 2016-05-04 NOTE — Telephone Encounter (Signed)
DO WE DO THIS? I THOUGHT THIS WAS HANDLED AT TIME OF SURGERY

## 2016-05-08 ENCOUNTER — Other Ambulatory Visit (HOSPITAL_COMMUNITY): Payer: Medicare Other

## 2016-05-14 ENCOUNTER — Inpatient Hospital Stay: Admit: 2016-05-14 | Payer: Medicare Other | Admitting: Orthopedic Surgery

## 2016-05-14 SURGERY — ARTHROPLASTY, KNEE, TOTAL
Anesthesia: Spinal | Laterality: Right

## 2016-05-18 ENCOUNTER — Telehealth: Payer: Self-pay | Admitting: Orthopedic Surgery

## 2016-05-18 NOTE — Telephone Encounter (Signed)
Patient called wanting to speak with you in reference to rescheduling her surgery.

## 2016-05-18 NOTE — Telephone Encounter (Signed)
DO WE HAVE AN ANSWER REGARDING TO PRE AUTHORIZATION OF REHAB PRIOR TO SURGERY  * SEE 05/04/16 NOTE

## 2016-05-18 NOTE — Telephone Encounter (Signed)
No I dont and I have requested info from our [facility] waiting for their response

## 2016-05-20 NOTE — Telephone Encounter (Signed)
Returned call, no answer.

## 2016-05-20 NOTE — Telephone Encounter (Signed)
Patient aware.

## 2016-05-27 ENCOUNTER — Encounter: Payer: Self-pay | Admitting: Orthopedic Surgery

## 2016-05-27 NOTE — Progress Notes (Signed)
Girard Cooter, RN  Carole Civil, MD        She would need a 3 day INPATIENT stay and a SNF recommendation from PT. If after surgery she doesn't meet SNF criteria with PT her medicare won't pay for it. If she doesn't have the PT recommendation after surgery she would have the option of paying privately for SNF which is $6-8K per month.   When is her surgery?   Previous Messages    ----- Message -----  From: Carole Civil, MD  Sent: 05/22/2016  8:07 AM  To: Girard Cooter, RN  Subject: POST OP DISCHRGE SERVICES             WILL SHE QUALIFY FOR REHAB POST OP [facility]KA ?

## 2016-05-29 ENCOUNTER — Ambulatory Visit: Payer: Medicare Other | Admitting: Orthopedic Surgery

## 2016-06-04 ENCOUNTER — Other Ambulatory Visit: Payer: Self-pay | Admitting: *Deleted

## 2016-06-04 ENCOUNTER — Telehealth: Payer: Self-pay | Admitting: Orthopedic Surgery

## 2016-06-04 DIAGNOSIS — Z96651 Presence of right artificial knee joint: Secondary | ICD-10-CM

## 2016-06-04 NOTE — Telephone Encounter (Signed)
Scheduled MAY 23

## 2016-06-04 NOTE — Telephone Encounter (Signed)
Patient wants to know when her surgery is going to be scheduled.  Please call her

## 2016-06-09 NOTE — Patient Instructions (Signed)
Rhonda Briggs  06/09/2016     @PREFPERIOPPHARMACY @   Your procedure is scheduled on  06/17/2016   Report to [facility] at  700  A.M.  Call this number if you have problems the morning of surgery:  [phone]   Remember:  Do not eat food or drink liquids after midnight.  Take these medicines the morning of surgery with A SIP OF WATER  Clebrex, hydrocodone.   Do not wear jewelry, make-up or nail polish.  Do not wear lotions, powders, or perfumes, or deoderant.  Do not shave 48 hours prior to surgery.  Men may shave face and neck.  Do not bring valuables to the hospital.  [facility] is not responsible for any belongings or valuables.  Contacts, dentures or bridgework may not be worn into surgery.  Leave your suitcase in the car.  After surgery it may be brought to your room.  For patients admitted to the hospital, discharge time will be determined by your treatment team.  Patients discharged the day of surgery will not be allowed to drive home.   Name and phone number of your driver:   family Special instructions:  None  Please read over the following fact sheets that you were given. Anesthesia Post-op Instructions and Care and Recovery After Surgery       Total Knee Replacement Total knee replacement is a procedure to replace the knee joint with an artificial (prosthetic) knee joint. The purpose of this surgery is to reduce knee pain and improve knee function. The prosthetic knee joint (prosthesis) is usually made of metal and plastic. It replaces parts of the thigh bone (femur), lower leg bone (tibia), and kneecap (patella) that are removed during the procedure. Tell a health care provider about:  Any allergies you have.  All medicines you are taking, including vitamins, herbs, eye drops, creams, and over-the-counter medicines.  Any problems you or family members have had with anesthetic medicines.  Any blood disorders you have.  Any  surgeries you have had.  Any medical conditions you have.  Whether you are pregnant or may be pregnant. What are the risks? Generally, this is a safe procedure. However, problems may occur, including:  Infection.  Bleeding.  Allergic reactions to medicines.  Damage to other structures or organs.  Decreased range of motion of the knee.  Instability of the knee.  Loosening of the prosthetic joint.  Knee pain that does not go away (chronic pain). What happens before the procedure?  Ask your health care provider about:  Changing or stopping your regular medicines. This is especially important if you are taking diabetes medicines or blood thinners.  Taking medicines such as aspirin and ibuprofen. These medicines can thin your blood. Do not take these medicines before your procedure if your health care provider instructs you not to.  Have dental care and routine cleanings completed before your procedure. Plan to not have dental work done for 3 months after your procedure. Germs from anywhere in your body, including your mouth, can travel to your new joint and infect it.  Follow instructions from your health care provider about eating or drinking restrictions.  Ask your health care provider how your surgical site will be marked or identified.  You may be given antibiotic medicine to help prevent infection.  If your health care provider prescribes physical therapy, do exercises as instructed.  Do not use any tobacco products, such  as cigarettes, chewing tobacco, or e-cigarettes. If you need help quitting, ask your health care provider.  You may have a physical exam.  You may have tests, such as:  X-rays.  MRI.  CT scan.  Bone scans.  You may have a blood or urine sample taken.  Plan to have someone take you home after the procedure.  If you will be going home right after the procedure, plan to have someone with you for at least 24 hours. It is recommended that you  have someone to help care for you for at least 4-6 weeks after your procedure. What happens during the procedure?  To reduce your risk of infection:  Your health care team will wash or sanitize their hands.  Your skin will be washed with soap.  An IV tube will be inserted into one of your veins.  You will be given one or more of the following:  A medicine to help you relax (sedative).  A medicine to numb the area (local anesthetic).  A medicine to make you fall asleep (general anesthetic).  A medicine that is injected into your spine to numb the area below and slightly above the injection site (spinal anesthetic).  A medicine that is injected into an area of your body to numb everything below the injection site (regional anesthetic).  An incision will be made in your knee.  Damaged cartilage and bone will be removed from your femur, tibia, and patella.  Parts of the prosthesis (liners)will be placed over the areas of bone and cartilage that were removed. A metal liner will be placed over your femur, and plastic liners will be placed over your tibia and the underside of your patella.  One or more small tubes (drains) may be placed near your incision to help drain extra fluid from your surgical site.  Your incision will be closed with stitches (sutures), skin glue, or adhesive strips. Medicine may be applied to your incision.  A bandage (dressing) will be placed over your incision. The procedure may vary among health care providers and hospitals. What happens after the procedure?  Your blood pressure, heart rate, breathing rate, and blood oxygen level will be monitored often until the medicines you were given have worn off.  You may continue to receive fluids and medicines through an IV tube.  You will have some pain. Pain medicines will be available to help you.  You may have fluid coming from one or more drains in your incision.  You may have to wear compression  stockings. These stockings help to prevent blood clots and reduce swelling in your legs.  You will be encouraged to move around as much as possible.  You may be given a continuous passive motion machine to use at home. You will be shown how to use this machine.  Do not drive for 24 hours if you received a sedative. This information is not intended to replace advice given to you by your health care provider. Make sure you discuss any questions you have with your health care provider. Document Released: 04/20/2000 Document Revised: 09/16/2015 Document Reviewed: 12/19/2014 Elsevier Interactive Patient Education  2017 [place].  Total Knee Replacement, Care After These instructions give you information about caring for yourself after your procedure. Your doctor may also give you more specific instructions. Call your doctor if you have any problems or questions after your procedure. Follow these instructions at home: Medicines   Take over-the-counter and prescription medicines only as told by your  doctor.  If you were prescribed an antibiotic medicine, take it as told by your doctor. Do not stop taking the antibiotic even if you start to feel better.  If you were prescribed a blood thinner (anticoagulant), take it as told by your doctor. If you have a splint or brace:   Wear the splint or brace as told by your doctor. Remove it only as told by your doctor.  Loosen the splint or brace if your toes tingle, get numb, or turn cold and blue.  Do not let your splint or brace get wet if it is not waterproof.  Keep the splint or brace clean. Bathing    Do not take baths, swim, or use a hot tub until your doctor says it is okay. Ask your doctor if you can take showers. You may only be allowed to take sponge baths for bathing.  If you have a splint or brace that is not waterproof, cover it with a watertight covering when you take a bath or a shower.  Keep your bandage (dressing) dry until  your doctor says it can be taken off. Incision care and drain care   Check your cut from surgery (incision) and your drain every day for signs of infection. Check for:  More redness, swelling, or pain.  More fluid or blood.  Warmth.  Pus or a bad smell.  Follow instructions from your doctor about how to take care of your cut from surgery. Make sure you:  Wash your hands with soap and water before you change your bandage. If you cannot use soap and water, use hand sanitizer.  Change your bandage as told by your doctor.  Leave stitches (sutures), skin glue, or skin tape (adhesive) strips in place. They may need to stay in place for 2 weeks or longer. If tape strips get loose and curl up, you may trim the loose edges. Do not remove tape strips completely unless your doctor says it is okay.  If you have a drain, follow instructions from your doctor about caring for it. Do not remove the drain tube or any bandages unless your doctor says it is okay. Managing pain, stiffness, and swelling    If directed, put ice on your knee.  Put ice in a plastic bag.  Place a towel between your skin and the bag.  Leave the ice on for 20 minutes, 2-3 times per day.  If directed, apply heat to the affected area as often as told by your doctor. Use the heat source that your doctor recommends, such as a moist heat pack or a heating pad.  Place a towel between your skin and the heat source.  Leave the heat on for 20-30 minutes.  Remove the heat if your skin turns bright red. This is especially important if you are unable to feel pain, heat, or cold. You may have a greater risk of getting burned.  Move your toes often to avoid stiffness and to lessen swelling.  Raise (elevate) your knee above the level of your heart while you are sitting or lying down.  Wear elastic knee support for as long as told by your doctor. Driving    Do not drive until your doctor says it is okay. Ask your doctor when  it is safe to drive if you have a splint or brace on your knee.  Do not drive or use heavy machinery while taking prescription pain medicine.  Do not drive for 24 hours if you received  a sedative. Activity   Do not lift anything that is heavier than 10 lb (4.5 kg) until your doctor says it is okay.  Do not play contact sports until your doctor says it is okay.  Avoid high-impact activities, including running, jumping rope, and jumping jacks.  Avoid sitting for a long time without moving. Get up and move around at least every few hours.  If physical therapy was prescribed, do exercises as told by your doctor.  Return to your normal activities as told by your doctor. Ask your doctor what activities are safe for you. Safety   Do not use your leg to support your body weight until your doctor says that you can. Use crutches or a walker as told by your doctor. General instructions    Do not have any dental work done for at least 3 months after your surgery. When you do have dental work done, tell your dentist about your joint replacement.  Do not use any tobacco products, such as cigarettes, chewing tobacco, or e-cigarettes. If you need help quitting, ask your doctor.  Wear special socks (compression stockings) as told by your doctor.  If you have been sent home with a knee joint motion machine (continuous passive motion machine), use it as told by your doctor.  Drink enough fluid to keep your pee (urine) clear or pale yellow.  If you have been told to lose weight, follow instructions from your doctor about how to do this safely.  Keep all follow-up visits as told by your doctor. This is important. Contact a doctor if:  You have more redness, swelling, or pain around your cut from surgery or your drain.  You have more fluid or blood coming from your cut from surgery or your drain.  Your cut from surgery or your drain area feels warm to the touch.  You have pus or a bad smell  coming from your cut from surgery or your drain.  You have a fever.  Your cut breaks open after your doctor removes your stitches, skin glue, or skin tape strips.  Your new joint feels loose.  You have knee pain that does not go away. Get help right away if:  You have a rash.  You have pain in your calf or thigh.  You have swelling in your calf or thigh.  You have shortness of breath.  You have trouble breathing.  You have chest pain.  Your ability to move your knee is getting worse. This information is not intended to replace advice given to you by your health care provider. Make sure you discuss any questions you have with your health care provider. Document Released: 04/06/2011 Document Revised: 09/16/2015 Document Reviewed: 12/19/2014 Elsevier Interactive Patient Education  2017 [place].  Spinal Anesthesia and Epidural Anesthesia Spinal anesthesia and epidural anesthesia are methods of numbing the body. They are done by injecting numbing medicine (anesthetic) into the back, near the spinal cord. Spinal anesthesia is usually done to numb an area at and below the place where the injection is made. It is often used during surgeries of the pelvis, hips, legs, and lower abdomen. It begins to work almost immediately. Epidural anesthesia may be done to numb an area above or below the area where the injection is made. It is often used during childbirth and after major abdominal or chest surgery. It begins to work after 10-20 minutes. Tell a health care provider about:  Any allergies you have.  All medicines you are taking,  including vitamins, herbs, eye drops, creams, and over-the-counter medicines.  Any problems you or family members have had with anesthetic medicines.  Any blood disorders you have.  Any surgeries you have had.  Any medical conditions you have.  Whether you are pregnant or may be pregnant.  Any recent alcohol, tobacco, or drug use. What are the  risks? Generally, this is a safe procedure. However, problems may occur, including:  Headache.  A drop in blood pressure. In some cases, this can lead to a heart attack or a stroke.  Nerve damage.  Infection.  Allergic reaction to medicines.  Seizures.  Spinal fluid leak.  Bleeding around the injection site.  Inability to breathe. If this happens, a tube may be put into your windpipe (trachea) and a machine may be used to help you breathe until the anesthetic wears off.  Long-lasting numbness, pain, or loss of function of body parts.  Nausea and vomiting.  Dizziness and fainting.  Shivering.  Itching. What happens before the procedure? Staying hydrated  Follow instructions from your health care provider about hydration, which may include:  Up to 2 hours before the procedure - you may continue to drink clear liquids, such as water, clear fruit juice, black coffee, and plain tea. Eating and drinking restrictions  Follow instructions from your health care provider about eating and drinking, which may include:  8 hours before the procedure - stop eating heavy meals or foods such as meat, fried foods, or fatty foods.  6 hours before the procedure - stop eating light meals or foods, such as toast or cereal.  6 hours before the procedure - stop drinking milk or drinks that contain milk.  2 hours before the procedure - stop drinking clear liquids. Medicine  Ask your health care provider about:  Changing or stopping your regular medicines. This is especially important if you are taking diabetes medicines or blood thinners.  Changing or stopping your dietary supplements.  Taking medicines such as aspirin and ibuprofen. These medicines can thin your blood. Do not take these medicines before your procedure if your health care provider instructs you not to. General instructions    Do not use any tobacco products, such as cigarettes, chewing tobacco, and electronic  cigarettes, for as long as possible before your procedure. If you need help quitting, ask your health care provider.  Ask your health care provider if you will have to stay overnight at the hospital or clinic.  If you will not have to stay overnight:  Plan to have someone take you home from the hospital or clinic.  Plan to have someone with you for 24 hours. What happens during the procedure?  A health care provider will put patches on your chest, a cuff around your arm, or a sensor device on your finger. These will be attached to monitors that allow your health care provider to watch your blood pressure, pulse, and oxygen levels to make sure that the anesthetic does not cause any problems.  An IV tube may be inserted into one of your veins. The tube will be used to give you fluids and medicines throughout the procedure as needed.  You may be given a medicine to help you relax (sedative).  You will be asked to sit up or to lie on your side with your knees and your chin bent toward your chest. These positions open up the space between the bones in your back, making it easier to inject the medicine.  The area  of your back where the medicine will be injected will be cleaned.  A medicine called a local anesthetic may be injected to numb the area where the spinal or epidural anesthetic will be injected.  A needle will be inserted between the bones of your back. While this is being done:  Continue to breathe normally.  Stay as still and quiet as you can.  If you feel a tingling shock or pain going down your leg, tell your health care provider but try not to move.  The spinal or epidural anesthetic will be injected.  If you receive an epidural anesthetic and need more than one dose, a tiny, flexible tube (catheter) will be placed where the anesthetic was injected. Additional doses will be given through the catheter. If you need pain medicine after the procedure, the catheter will be kept in  place.  If an epidural catheter has not been placed in your injection site or left there, a small bandage (dressing) will be placed over the injection site. The procedure may vary among health care providers and hospitals. What happens after the procedure?  You will need to stay in bed until your health care provider says it safe for you to walk.  Your blood pressure, heart rate, breathing rate, and blood oxygen level will be monitored until the medicines you were given have worn off.  If you have a catheter, it will be removed when it is no longer needed.  Do not drive for 24 hours if you received a sedative.  It is common to have nausea and itching. There are medicines that can help with these side effects. It is also common to have:  Sleepiness.  Vomiting.  Numbness or tingling in your legs.  Trouble urinating. This information is not intended to replace advice given to you by your health care provider. Make sure you discuss any questions you have with your health care provider. Document Released: 04/04/2003 Document Revised: 06/25/2015 Document Reviewed: 05/06/2015 Elsevier Interactive Patient Education  2017 [place].  Spinal Anesthesia and Epidural Anesthesia, Care After These instructions give you information about caring for yourself after your procedure. Your doctor may also give you more specific instructions. Call your doctor if you have any problems or questions after your procedure. Follow these instructions at home: For at least 24 hours after the procedure:    Do not:  Do activities where you could fall or get hurt (injured).  Drive.  Use heavy machinery.  Drink alcohol.  Take sleeping pills or medicines that make you sleepy (drowsy).  Make important decisions.  Sign legal documents.  Take care of children on your own.  Rest. Eating and drinking   If you throw up (vomit), drink water, juice, or soup when you can drink without throwing  up.  Make sure you do not feel like throwing up (are not nauseous) before you eat.  Follow the diet that is recommended by your doctor. General instructions   Have a responsible adult stay with you until you are awake and alert.  Take over-the-counter and prescription medicines only as told by your doctor.  If you smoke, do not smoke unless an adult is watching.  Keep all follow-up visits as told by your doctor. This is important. Contact a doctor if:  It has been more than one day since your procedure and you feel like throwing up.  It has been more than one day since your procedure and you throw up.  You have a rash.  Get help right away if:  You have a fever.  You have a headache that lasts a long time.  You have a very bad headache.  Your vision is blurry.  You see two of a single object (double vision).  You are dizzy or light-headed.  You faint.  Your arms or legs tingle, feel weak, or get numb.  You have trouble breathing.  You cannot pee (urinate). This information is not intended to replace advice given to you by your health care provider. Make sure you discuss any questions you have with your health care provider. Document Released: 05/06/2015 Document Revised: 09/05/2015 Document Reviewed: 05/06/2015 Elsevier Interactive Patient Education  2017 [place] Anesthesia, Adult General anesthesia is the use of medicines to make a person "go to sleep" (be unconscious) for a medical procedure. General anesthesia is often recommended when a procedure:  Is long.  Requires you to be still or in an unusual position.  Is major and can cause you to lose blood.  Is impossible to do without general anesthesia. The medicines used for general anesthesia are called general anesthetics. In addition to making you sleep, the medicines:  Prevent pain.  Control your blood pressure.  Relax your muscles. Tell a health care provider about:  Any allergies  you have.  All medicines you are taking, including vitamins, herbs, eye drops, creams, and over-the-counter medicines.  Any problems you or family members have had with anesthetic medicines.  Types of anesthetics you have had in the past.  Any bleeding disorders you have.  Any surgeries you have had.  Any medical conditions you have.  Any history of heart or lung conditions, such as heart failure, sleep apnea, or chronic obstructive pulmonary disease (COPD).  Whether you are pregnant or may be pregnant.  Whether you use tobacco, alcohol, marijuana, or street drugs.  Any history of Armed forces logistics/support/administrative officer.  Any history of depression or anxiety. What are the risks? Generally, this is a safe procedure. However, problems may occur, including:  Allergic reaction to anesthetics.  Lung and heart problems.  Inhaling food or liquids from your stomach into your lungs (aspiration).  Injury to nerves.  Waking up during your procedure and being unable to move (rare).  Extreme agitation or a state of mental confusion (delirium) when you wake up from the anesthetic.  Air in the bloodstream, which can lead to stroke. These problems are more likely to develop if you are having a major surgery or if you have an advanced medical condition. You can prevent some of these complications by answering all of your health care provider's questions thoroughly and by following all pre-procedure instructions. General anesthesia can cause side effects, including:  Nausea or vomiting  A sore throat from the breathing tube.  Feeling cold or shivery.  Feeling tired, washed out, or achy.  Sleepiness or drowsiness.  Confusion or agitation. What happens before the procedure? Staying hydrated  Follow instructions from your health care provider about hydration, which may include:  Up to 2 hours before the procedure - you may continue to drink clear liquids, such as water, clear fruit juice, black coffee,  and plain tea. Eating and drinking restrictions  Follow instructions from your health care provider about eating and drinking, which may include:  8 hours before the procedure - stop eating heavy meals or foods such as meat, fried foods, or fatty foods.  6 hours before the procedure - stop eating light meals or foods, such as toast or  cereal.  6 hours before the procedure - stop drinking milk or drinks that contain milk.  2 hours before the procedure - stop drinking clear liquids. Medicines   Ask your health care provider about:  Changing or stopping your regular medicines. This is especially important if you are taking diabetes medicines or blood thinners.  Taking medicines such as aspirin and ibuprofen. These medicines can thin your blood. Do not take these medicines before your procedure if your health care provider instructs you not to.  Taking new dietary supplements or medicines. Do not take these during the week before your procedure unless your health care provider approves them.  If you are told to take a medicine or to continue taking a medicine on the day of the procedure, take the medicine with sips of water. General instructions    Ask if you will be going home the same day, the following day, or after a longer hospital stay.  Plan to have someone take you home.  Plan to have someone stay with you for the first 24 hours after you leave the hospital or clinic.  For 3-6 weeks before the procedure, try not to use any tobacco products, such as cigarettes, chewing tobacco, and e-cigarettes.  You may brush your teeth on the morning of the procedure, but make sure to spit out the toothpaste. What happens during the procedure?  You will be given anesthetics through a mask and through an IV tube in one of your veins.  You may receive medicine to help you relax (sedative).  As soon as you are asleep, a breathing tube may be used to help you breathe.  An anesthesia  specialist will stay with you throughout the procedure. He or she will help keep you comfortable and safe by continuing to give you medicines and adjusting the amount of medicine that you get. He or she will also watch your blood pressure, pulse, and oxygen levels to make sure that the anesthetics do not cause any problems.  If a breathing tube was used to help you breathe, it will be removed before you wake up. The procedure may vary among health care providers and hospitals. What happens after the procedure?  You will wake up, often slowly, after the procedure is complete, usually in a recovery area.  Your blood pressure, heart rate, breathing rate, and blood oxygen level will be monitored until the medicines you were given have worn off.  You may be given medicine to help you calm down if you feel anxious or agitated.  If you will be going home the same day, your health care provider may check to make sure you can stand, drink, and urinate.  Your health care providers will treat your pain and side effects before you go home.  Do not drive for 24 hours if you received a sedative.  You may:  Feel nauseous and vomit.  Have a sore throat.  Have mental slowness.  Feel cold or shivery.  Feel sleepy.  Feel tired.  Feel sore or achy, even in parts of your body where you did not have surgery. This information is not intended to replace advice given to you by your health care provider. Make sure you discuss any questions you have with your health care provider. Document Released: 04/21/2007 Document Revised: 06/25/2015 Document Reviewed: 12/27/2014 Elsevier Interactive Patient Education  2017 [employer] Anesthesia, Adult, Care After These instructions provide you with information about caring for yourself after your procedure. Your health  care provider may also give you more specific instructions. Your treatment has been planned according to current medical practices, but  problems sometimes occur. Call your health care provider if you have any problems or questions after your procedure. What can I expect after the procedure? After the procedure, it is common to have:  Vomiting.  A sore throat.  Mental slowness. It is common to feel:  Nauseous.  Cold or shivery.  Sleepy.  Tired.  Sore or achy, even in parts of your body where you did not have surgery. Follow these instructions at home: For at least 24 hours after the procedure:   Do not:  Participate in activities where you could fall or become injured.  Drive.  Use heavy machinery.  Drink alcohol.  Take sleeping pills or medicines that cause drowsiness.  Make important decisions or sign legal documents.  Take care of children on your own.  Rest. Eating and drinking   If you vomit, drink water, juice, or soup when you can drink without vomiting.  Drink enough fluid to keep your urine clear or pale yellow.  Make sure you have little or no nausea before eating solid foods.  Follow the diet recommended by your health care provider. General instructions   Have a responsible adult stay with you until you are awake and alert.  Return to your normal activities as told by your health care provider. Ask your health care provider what activities are safe for you.  Take over-the-counter and prescription medicines only as told by your health care provider.  If you smoke, do not smoke without supervision.  Keep all follow-up visits as told by your health care provider. This is important. Contact a health care provider if:  You continue to have nausea or vomiting at home, and medicines are not helpful.  You cannot drink fluids or start eating again.  You cannot urinate after 8-12 hours.  You develop a skin rash.  You have fever.  You have increasing redness at the site of your procedure. Get help right away if:  You have difficulty breathing.  You have chest pain.  You  have unexpected bleeding.  You feel that you are having a life-threatening or urgent problem. This information is not intended to replace advice given to you by your health care provider. Make sure you discuss any questions you have with your health care provider. Document Released: 04/20/2000 Document Revised: 06/17/2015 Document Reviewed: 12/27/2014 Elsevier Interactive Patient Education  2017 [employer].

## 2016-06-12 ENCOUNTER — Other Ambulatory Visit: Payer: Self-pay

## 2016-06-12 ENCOUNTER — Encounter (HOSPITAL_COMMUNITY): Payer: Self-pay

## 2016-06-12 ENCOUNTER — Encounter (HOSPITAL_COMMUNITY)
Admission: RE | Admit: 2016-06-12 | Discharge: 2016-06-12 | Disposition: A | Discharge status: Home / Self Care | Payer: Medicare Other | Source: Ambulatory Visit | Attending: Orthopedic Surgery | Admitting: Orthopedic Surgery

## 2016-06-12 DIAGNOSIS — Z01812 Encounter for preprocedural laboratory examination: Secondary | ICD-10-CM | POA: Diagnosis not present

## 2016-06-12 DIAGNOSIS — M25569 Pain in unspecified knee: Secondary | ICD-10-CM | POA: Diagnosis not present

## 2016-06-12 DIAGNOSIS — Z01810 Encounter for preprocedural cardiovascular examination: Secondary | ICD-10-CM | POA: Insufficient documentation

## 2016-06-12 DIAGNOSIS — I4510 Unspecified right bundle-branch block: Secondary | ICD-10-CM | POA: Diagnosis not present

## 2016-06-12 HISTORY — DX: Nausea with vomiting, unspecified: R11.2

## 2016-06-12 HISTORY — DX: Other specified postprocedural states: Z98.890

## 2016-06-12 LAB — CBC WITH DIFFERENTIAL/PLATELET
BASOS PCT: 0 %
Basophils Absolute: 0 10*3/uL (ref 0.0–0.1)
Eosinophils Absolute: 0.2 10*3/uL (ref 0.0–0.7)
Eosinophils Relative: 2 %
HCT: 31.3 % — ABNORMAL LOW (ref 36.0–46.0)
HEMOGLOBIN: 9.5 g/dL — AB (ref 12.0–15.0)
LYMPHS PCT: 28 %
Lymphs Abs: 3.1 10*3/uL (ref 0.7–4.0)
MCH: 20.4 pg — AB (ref 26.0–34.0)
MCHC: 30.4 g/dL (ref 30.0–36.0)
MCV: 67.2 fL — ABNORMAL LOW (ref 78.0–100.0)
Monocytes Absolute: 0.8 10*3/uL (ref 0.1–1.0)
Monocytes Relative: 7 %
NEUTROS ABS: 6.9 10*3/uL (ref 1.7–7.7)
Neutrophils Relative %: 63 %
Platelets: 706 10*3/uL — ABNORMAL HIGH (ref 150–400)
RBC: 4.66 MIL/uL (ref 3.87–5.11)
RDW: 17.7 % — ABNORMAL HIGH (ref 11.5–15.5)
WBC: 11 10*3/uL — ABNORMAL HIGH (ref 4.0–10.5)

## 2016-06-12 LAB — PROTIME-INR
INR: 0.96
Prothrombin Time: 12.8 seconds (ref 11.4–15.2)

## 2016-06-12 LAB — BASIC METABOLIC PANEL
Anion gap: 10 (ref 5–15)
BUN: 17 mg/dL (ref 6–20)
CALCIUM: 9.2 mg/dL (ref 8.9–10.3)
CO2: 27 mmol/L (ref 22–32)
CREATININE: 1.1 mg/dL — AB (ref 0.44–1.00)
Chloride: 97 mmol/L — ABNORMAL LOW (ref 101–111)
GFR calc Af Amer: 60 mL/min — ABNORMAL LOW (ref >60)
GFR calc non Af Amer: 52 mL/min — ABNORMAL LOW (ref >60)
GLUCOSE: 95 mg/dL (ref 65–99)
Potassium: 3.9 mmol/L (ref 3.5–5.1)
Sodium: 134 mmol/L — ABNORMAL LOW (ref 135–145)

## 2016-06-12 LAB — APTT: aPTT: 30 seconds (ref 24–36)

## 2016-06-12 LAB — SURGICAL PCR SCREEN
MRSA, PCR: NEGATIVE
Staphylococcus aureus: NEGATIVE

## 2016-06-12 LAB — PREPARE RBC (CROSSMATCH)

## 2016-06-12 LAB — ABO/RH: ABO/RH(D): A NEG

## 2016-06-15 ENCOUNTER — Other Ambulatory Visit: Payer: Self-pay | Admitting: Orthopedic Surgery

## 2016-06-15 ENCOUNTER — Other Ambulatory Visit: Payer: Self-pay | Admitting: *Deleted

## 2016-06-15 ENCOUNTER — Telehealth: Payer: Self-pay | Admitting: Orthopedic Surgery

## 2016-06-15 MED ORDER — HYDROCODONE-ACETAMINOPHEN 5-325 MG PO TABS: ORAL_TABLET | ORAL | 0 refills | Status: DC

## 2016-06-15 MED ORDER — FERROUS SULFATE 325 (65 FE) MG PO TABS: 325.0000 mg | ORAL_TABLET | Freq: Three times a day (TID) | ORAL | 0 refills | Status: DC

## 2016-06-15 NOTE — Progress Notes (Signed)
e

## 2016-06-15 NOTE — Telephone Encounter (Signed)
Patient requests refill on Hydrocodone/Acetaminophen 5-325   Mgs.   Qty  30 ° ° ° °Sig: One tablet every four hours as needed for acute pain.  Limit of five days per [place]. °

## 2016-06-15 NOTE — Progress Notes (Signed)
Dr Patsey Berthold notified of Hem 9.5 Hct 31.3.   No orders given

## 2016-06-15 NOTE — Telephone Encounter (Signed)
Routing to Dr Aline Brochure for approval

## 2016-06-17 ENCOUNTER — Encounter (HOSPITAL_COMMUNITY): Admission: RE | Payer: Self-pay | Source: Ambulatory Visit

## 2016-06-17 ENCOUNTER — Inpatient Hospital Stay (HOSPITAL_COMMUNITY): Admission: RE | Admit: 2016-06-17 | Payer: Medicare Other | Source: Ambulatory Visit | Admitting: Orthopedic Surgery

## 2016-06-17 SURGERY — ARTHROPLASTY, KNEE, TOTAL
Anesthesia: Spinal | Laterality: Right

## 2016-06-20 LAB — TYPE AND SCREEN
ABO/RH(D): A NEG
Antibody Screen: NEGATIVE
UNIT DIVISION: 0
Unit division: 0

## 2016-06-20 LAB — BPAM RBC
Blood Product Expiration Date: 201805252359
Blood Product Expiration Date: 201805252359
Unit Type and Rh: 600
Unit Type and Rh: 600

## 2016-06-24 ENCOUNTER — Telehealth: Payer: Self-pay | Admitting: Orthopedic Surgery

## 2016-06-24 NOTE — Telephone Encounter (Signed)
Made patient aware blood work around June 21

## 2016-06-24 NOTE — Telephone Encounter (Signed)
Check labs 1 month after being on iron

## 2016-06-24 NOTE — Telephone Encounter (Signed)
When will patient need additional labs to check hemoglobin?

## 2016-06-24 NOTE — Telephone Encounter (Signed)
Patient called to ask about the appointment for 06/29/16 - we cancelled, as it was for first post op, surgery, and surgery for 06/17/16 was cancelled due to abnormal labs (hemoglobin was low).  Patient is inquiring about a re-schedule date for the right total knee procedure.  Ph# [phone]

## 2016-06-29 ENCOUNTER — Telehealth: Payer: Self-pay | Admitting: Orthopedic Surgery

## 2016-06-29 ENCOUNTER — Other Ambulatory Visit: Payer: Self-pay | Admitting: Orthopedic Surgery

## 2016-06-29 ENCOUNTER — Ambulatory Visit: Payer: Medicare Other | Admitting: Orthopedic Surgery

## 2016-06-29 MED ORDER — HYDROCODONE-ACETAMINOPHEN 5-325 MG PO TABS: ORAL_TABLET | ORAL | 0 refills | Status: DC

## 2016-06-29 NOTE — Telephone Encounter (Signed)
Hydrocodone-Acetaminophen 5/325 mg  Qty  30 Tablets  One tablet every four hours as needed for acute pain. Limit of five days per [place] stated statue.

## 2016-06-29 NOTE — Telephone Encounter (Signed)
Patient left message on voicemail asking for you to give her a call @   [phone].

## 2016-06-29 NOTE — Telephone Encounter (Signed)
Routing to Dr Harrison 

## 2016-07-13 ENCOUNTER — Other Ambulatory Visit: Payer: Self-pay | Admitting: *Deleted

## 2016-07-13 ENCOUNTER — Telehealth: Payer: Self-pay | Admitting: Orthopedic Surgery

## 2016-07-13 ENCOUNTER — Other Ambulatory Visit: Payer: Self-pay | Admitting: Orthopedic Surgery

## 2016-07-13 DIAGNOSIS — D649 Anemia, unspecified: Secondary | ICD-10-CM

## 2016-07-13 MED ORDER — HYDROCODONE-ACETAMINOPHEN 5-325 MG PO TABS: ORAL_TABLET | ORAL | 0 refills | Status: DC

## 2016-07-13 NOTE — Telephone Encounter (Signed)
PATIENT AWARE  REQUESTS REFILL ON HYDROCODONE 5/325

## 2016-07-13 NOTE — Telephone Encounter (Signed)
Routing to Dr. Harrison to advise 

## 2016-07-13 NOTE — Telephone Encounter (Signed)
Not advised add an nsaid heat or topical medication such as aspercreme capzacin

## 2016-07-13 NOTE — Telephone Encounter (Signed)
Pt is asking for something else. She stated that the Hydrocodone isn't helping.  Hydrocodone-Acetaminophen 5/325 mg  Qty 30 Tablets  One tablet every four hours as needed for acute pain. Limit of five days per [place] statue.

## 2016-07-16 ENCOUNTER — Other Ambulatory Visit (HOSPITAL_COMMUNITY)
Admission: RE | Admit: 2016-07-16 | Discharge: 2016-07-16 | Disposition: A | Discharge status: Home / Self Care | Payer: Medicare Other | Source: Ambulatory Visit | Attending: Orthopedic Surgery | Admitting: Orthopedic Surgery

## 2016-07-16 DIAGNOSIS — D649 Anemia, unspecified: Secondary | ICD-10-CM | POA: Diagnosis present

## 2016-07-16 LAB — CBC
HCT: 33.2 % — ABNORMAL LOW (ref 36.0–46.0)
Hemoglobin: 10.2 g/dL — ABNORMAL LOW (ref 12.0–15.0)
MCH: 21.4 pg — AB (ref 26.0–34.0)
MCHC: 30.7 g/dL (ref 30.0–36.0)
MCV: 69.6 fL — AB (ref 78.0–100.0)
PLATELETS: 765 10*3/uL — AB (ref 150–400)
RBC: 4.77 MIL/uL (ref 3.87–5.11)
RDW: 18.2 % — ABNORMAL HIGH (ref 11.5–15.5)
WBC: 12.5 10*3/uL — AB (ref 4.0–10.5)

## 2016-07-21 ENCOUNTER — Other Ambulatory Visit (HOSPITAL_COMMUNITY): Payer: Self-pay | Admitting: Orthopedic Surgery

## 2016-07-21 ENCOUNTER — Telehealth: Payer: Self-pay

## 2016-07-21 MED ORDER — HYDROCODONE-ACETAMINOPHEN 5-325 MG PO TABS
ORAL_TABLET | ORAL | 0 refills | Status: DC
Start: 2016-07-21 — End: 2016-07-28

## 2016-07-21 NOTE — Telephone Encounter (Signed)
Patient called back - also asking (in addition to lab results, which were in reference to the total knee surgery, cancelled twice due to blood work)  for pain medication refill, Hydrocodone-acetaminophen (NORCO/VICODIN)

## 2016-07-21 NOTE — Telephone Encounter (Signed)
Pt wants you to call her about her Labs

## 2016-07-21 NOTE — Telephone Encounter (Signed)
ROUTING REFILL REQUEST TO DR Aline Brochure

## 2016-07-28 ENCOUNTER — Telehealth: Payer: Self-pay | Admitting: Orthopedic Surgery

## 2016-07-28 ENCOUNTER — Other Ambulatory Visit: Payer: Self-pay | Admitting: *Deleted

## 2016-07-28 MED ORDER — HYDROCODONE-ACETAMINOPHEN 5-325 MG PO TABS
ORAL_TABLET | ORAL | 0 refills | Status: DC
Start: 2016-07-28 — End: 2016-08-11

## 2016-07-28 NOTE — Telephone Encounter (Signed)
ROUTING TO DR HARRISON FOR APPROVAL 

## 2016-07-28 NOTE — Telephone Encounter (Signed)
Patient requests a refill on Hydrocodone/Acetaminophen (Norco)  5-325  Qty  30       Sig: One tablet every four hours as needed for acute pain. Limit of five days per [place] statue.

## 2016-07-28 NOTE — Telephone Encounter (Signed)
APPROVED

## 2016-07-31 ENCOUNTER — Other Ambulatory Visit: Payer: Self-pay | Admitting: *Deleted

## 2016-08-10 ENCOUNTER — Other Ambulatory Visit: Payer: Self-pay | Admitting: *Deleted

## 2016-08-11 ENCOUNTER — Other Ambulatory Visit: Payer: Self-pay | Admitting: Orthopedic Surgery

## 2016-08-11 ENCOUNTER — Telehealth: Payer: Self-pay | Admitting: Orthopedic Surgery

## 2016-08-11 MED ORDER — HYDROCODONE-ACETAMINOPHEN 5-325 MG PO TABS: ORAL_TABLET | ORAL | 0 refills | Status: DC

## 2016-08-11 NOTE — Telephone Encounter (Signed)
Hydrocodone-Acetaminophen 5/325 mg  Qty 30 Tablets  Take 1 tablet every four hours as needed for acute pain.  Limit of five days per [facility].

## 2016-08-11 NOTE — Telephone Encounter (Signed)
Routing to Dr Harrison for approval 

## 2016-08-12 NOTE — Patient Instructions (Signed)
Rhonda Briggs  08/12/2016     @PREFPERIOPPHARMACY @   Your procedure is scheduled on  08/19/2016   Report to [facility] at  25  A.M.  Call this number if you have problems the morning of surgery:  [phone]   Remember:  Do not eat food or drink liquids after midnight.  Take these medicines the morning of surgery with A SIP OF WATER  Celebrex, hydrocodone.   Do not wear jewelry, make-up or nail polish.  Do not wear lotions, powders, or perfumes, or deoderant.  Do not shave 48 hours prior to surgery.  Men may shave face and neck.  Do not bring valuables to the hospital.  [facility] is not responsible for any belongings or valuables.  Contacts, dentures or bridgework may not be worn into surgery.  Leave your suitcase in the car.  After surgery it may be brought to your room.  For patients admitted to the hospital, discharge time will be determined by your treatment team.  Patients discharged the day of surgery will not be allowed to drive home.   Name and phone number of your driver:   family Special instructions:  None  Please read over the following fact sheets that you were given. Pain Booklet, Coughing and Deep Breathing, Blood Transfusion Information, Lab Information, Total Joint Packet, MRSA Information, Surgical Site Infection Prevention, Anesthesia Post-op Instructions and Care and Recovery After Surgery       Spinal Anesthesia and Epidural Anesthesia Spinal anesthesia and epidural anesthesia are methods of numbing the body. They are done by injecting numbing medicine (anesthetic) into the back, near the spinal cord. Spinal anesthesia is usually done to numb an area at and below the place where the injection is made. It is often used during surgeries of the pelvis, hips, legs, and lower abdomen. It begins to work almost immediately. Epidural anesthesia may be done to numb an area above or below the area where the injection is made. It is often used  during childbirth and after major abdominal or chest surgery. It begins to work after 10-20 minutes. Tell a health care provider about:  Any allergies you have.  All medicines you are taking, including vitamins, herbs, eye drops, creams, and over-the-counter medicines.  Any problems you or family members have had with anesthetic medicines.  Any blood disorders you have.  Any surgeries you have had.  Any medical conditions you have.  Whether you are pregnant or may be pregnant.  Any recent alcohol, tobacco, or drug use. What are the risks? Generally, this is a safe procedure. However, problems may occur, including:  Headache.  A drop in blood pressure. In some cases, this can lead to a heart attack or a stroke.  Nerve damage.  Infection.  Allergic reaction to medicines.  Seizures.  Spinal fluid leak.  Bleeding around the injection site.  Inability to breathe. If this happens, a tube may be put into your windpipe (trachea) and a machine may be used to help you breathe until the anesthetic wears off.  Long-lasting numbness, pain, or loss of function of body parts.  Nausea and vomiting.  Dizziness and fainting.  Shivering.  Itching.  What happens before the procedure? Staying hydrated Follow instructions from your health care provider about hydration, which may include:  Up to 2 hours before the procedure - you may continue to drink clear liquids, such as water, clear fruit juice, black coffee, and plain tea.  Eating and drinking restrictions Follow instructions from your health care provider about eating and drinking, which may include:  8 hours before the procedure - stop eating heavy meals or foods such as meat, fried foods, or fatty foods.  6 hours before the procedure - stop eating light meals or foods, such as toast or cereal.  6 hours before the procedure - stop drinking milk or drinks that contain milk.  2 hours before the procedure - stop drinking  clear liquids.  Medicine Ask your health care provider about:  Changing or stopping your regular medicines. This is especially important if you are taking diabetes medicines or blood thinners.  Changing or stopping your dietary supplements.  Taking medicines such as aspirin and ibuprofen. These medicines can thin your blood. Do not take these medicines before your procedure if your health care provider instructs you not to.  General instructions   Do not use any tobacco products, such as cigarettes, chewing tobacco, and electronic cigarettes, for as long as possible before your procedure. If you need help quitting, ask your health care provider.  Ask your health care provider if you will have to stay overnight at the hospital or clinic.  If you will not have to stay overnight: ? Plan to have someone take you home from the hospital or clinic. ? Plan to have someone with you for 24 hours. What happens during the procedure?  A health care provider will put patches on your chest, a cuff around your arm, or a sensor device on your finger. These will be attached to monitors that allow your health care provider to watch your blood pressure, pulse, and oxygen levels to make sure that the anesthetic does not cause any problems.  An IV tube may be inserted into one of your veins. The tube will be used to give you fluids and medicines throughout the procedure as needed.  You may be given a medicine to help you relax (sedative).  You will be asked to sit up or to lie on your side with your knees and your chin bent toward your chest. These positions open up the space between the bones in your back, making it easier to inject the medicine.  The area of your back where the medicine will be injected will be cleaned.  A medicine called a local anesthetic may be injected to numb the area where the spinal or epidural anesthetic will be injected.  A needle will be inserted between the bones of your  back. While this is being done: ? Continue to breathe normally. ? Stay as still and quiet as you can. ? If you feel a tingling shock or pain going down your leg, tell your health care provider but try not to move.  The spinal or epidural anesthetic will be injected.  If you receive an epidural anesthetic and need more than one dose, a tiny, flexible tube (catheter) will be placed where the anesthetic was injected. Additional doses will be given through the catheter. If you need pain medicine after the procedure, the catheter will be kept in place.  If an epidural catheter has not been placed in your injection site or left there, a small bandage (dressing) will be placed over the injection site. The procedure may vary among health care providers and hospitals. What happens after the procedure?  You will need to stay in bed until your health care provider says it safe for you to walk.  Your blood pressure, heart rate, breathing  rate, and blood oxygen level will be monitored until the medicines you were given have worn off.  If you have a catheter, it will be removed when it is no longer needed.  Do not drive for 24 hours if you received a sedative.  It is common to have nausea and itching. There are medicines that can help with these side effects. It is also common to have: ? Sleepiness. ? Vomiting. ? Numbness or tingling in your legs. ? Trouble urinating. This information is not intended to replace advice given to you by your health care provider. Make sure you discuss any questions you have with your health care provider. Document Released: 04/04/2003 Document Revised: 06/25/2015 Document Reviewed: 05/06/2015 Elsevier Interactive Patient Education  2018 [employer].  Spinal Anesthesia and Epidural Anesthesia, Care After These instructions give you information about caring for yourself after your procedure. Your doctor may also give you more specific instructions. Call your doctor if  you have any problems or questions after your procedure. Follow these instructions at home: For at least 24 hours after the procedure:   Do not: ? Do activities where you could fall or get hurt (injured). ? Drive. ? Use heavy machinery. ? Drink alcohol. ? Take sleeping pills or medicines that make you sleepy (drowsy). ? Make important decisions. ? Sign legal documents. ? Take care of children on your own.  Rest. Eating and drinking  If you throw up (vomit), drink water, juice, or soup when you can drink without throwing up.  Make sure you do not feel like throwing up (are not nauseous) before you eat.  Follow the diet that is recommended by your doctor. General instructions  Have a responsible adult stay with you until you are awake and alert.  Take over-the-counter and prescription medicines only as told by your doctor.  If you smoke, do not smoke unless an adult is watching.  Keep all follow-up visits as told by your doctor. This is important. Contact a doctor if:  It has been more than one day since your procedure and you feel like throwing up.  It has been more than one day since your procedure and you throw up.  You have a rash. Get help right away if:  You have a fever.  You have a headache that lasts a long time.  You have a very bad headache.  Your vision is blurry.  You see two of a single object (double vision).  You are dizzy or light-headed.  You faint.  Your arms or legs tingle, feel weak, or get numb.  You have trouble breathing.  You cannot pee (urinate). This information is not intended to replace advice given to you by your health care provider. Make sure you discuss any questions you have with your health care provider. Document Released: 05/06/2015 Document Revised: 09/05/2015 Document Reviewed: 05/06/2015 Elsevier Interactive Patient Education  2018 [place] Anesthesia, Adult General anesthesia is the use of medicines  to make a person "go to sleep" (be unconscious) for a medical procedure. General anesthesia is often recommended when a procedure:  Is long.  Requires you to be still or in an unusual position.  Is major and can cause you to lose blood.  Is impossible to do without general anesthesia.  The medicines used for general anesthesia are called general anesthetics. In addition to making you sleep, the medicines:  Prevent pain.  Control your blood pressure.  Relax your muscles.  Tell a health care provider  about:  Any allergies you have.  All medicines you are taking, including vitamins, herbs, eye drops, creams, and over-the-counter medicines.  Any problems you or family members have had with anesthetic medicines.  Types of anesthetics you have had in the past.  Any bleeding disorders you have.  Any surgeries you have had.  Any medical conditions you have.  Any history of heart or lung conditions, such as heart failure, sleep apnea, or chronic obstructive pulmonary disease (COPD).  Whether you are pregnant or may be pregnant.  Whether you use tobacco, alcohol, marijuana, or street drugs.  Any history of Armed forces logistics/support/administrative officer.  Any history of depression or anxiety. What are the risks? Generally, this is a safe procedure. However, problems may occur, including:  Allergic reaction to anesthetics.  Lung and heart problems.  Inhaling food or liquids from your stomach into your lungs (aspiration).  Injury to nerves.  Waking up during your procedure and being unable to move (rare).  Extreme agitation or a state of mental confusion (delirium) when you wake up from the anesthetic.  Air in the bloodstream, which can lead to stroke.  These problems are more likely to develop if you are having a major surgery or if you have an advanced medical condition. You can prevent some of these complications by answering all of your health care provider's questions thoroughly and by  following all pre-procedure instructions. General anesthesia can cause side effects, including:  Nausea or vomiting  A sore throat from the breathing tube.  Feeling cold or shivery.  Feeling tired, washed out, or achy.  Sleepiness or drowsiness.  Confusion or agitation.  What happens before the procedure? Staying hydrated Follow instructions from your health care provider about hydration, which may include:  Up to 2 hours before the procedure - you may continue to drink clear liquids, such as water, clear fruit juice, black coffee, and plain tea.  Eating and drinking restrictions Follow instructions from your health care provider about eating and drinking, which may include:  8 hours before the procedure - stop eating heavy meals or foods such as meat, fried foods, or fatty foods.  6 hours before the procedure - stop eating light meals or foods, such as toast or cereal.  6 hours before the procedure - stop drinking milk or drinks that contain milk.  2 hours before the procedure - stop drinking clear liquids.  Medicines  Ask your health care provider about: ? Changing or stopping your regular medicines. This is especially important if you are taking diabetes medicines or blood thinners. ? Taking medicines such as aspirin and ibuprofen. These medicines can thin your blood. Do not take these medicines before your procedure if your health care provider instructs you not to. ? Taking new dietary supplements or medicines. Do not take these during the week before your procedure unless your health care provider approves them.  If you are told to take a medicine or to continue taking a medicine on the day of the procedure, take the medicine with sips of water. General instructions   Ask if you will be going home the same day, the following day, or after a longer hospital stay. ? Plan to have someone take you home. ? Plan to have someone stay with you for the first 24 hours after  you leave the hospital or clinic.  For 3-6 weeks before the procedure, try not to use any tobacco products, such as cigarettes, chewing tobacco, and e-cigarettes.  You may brush your  teeth on the morning of the procedure, but make sure to spit out the toothpaste. What happens during the procedure?  You will be given anesthetics through a mask and through an IV tube in one of your veins.  You may receive medicine to help you relax (sedative).  As soon as you are asleep, a breathing tube may be used to help you breathe.  An anesthesia specialist will stay with you throughout the procedure. He or she will help keep you comfortable and safe by continuing to give you medicines and adjusting the amount of medicine that you get. He or she will also watch your blood pressure, pulse, and oxygen levels to make sure that the anesthetics do not cause any problems.  If a breathing tube was used to help you breathe, it will be removed before you wake up. The procedure may vary among health care providers and hospitals. What happens after the procedure?  You will wake up, often slowly, after the procedure is complete, usually in a recovery area.  Your blood pressure, heart rate, breathing rate, and blood oxygen level will be monitored until the medicines you were given have worn off.  You may be given medicine to help you calm down if you feel anxious or agitated.  If you will be going home the same day, your health care provider may check to make sure you can stand, drink, and urinate.  Your health care providers will treat your pain and side effects before you go home.  Do not drive for 24 hours if you received a sedative.  You may: ? Feel nauseous and vomit. ? Have a sore throat. ? Have mental slowness. ? Feel cold or shivery. ? Feel sleepy. ? Feel tired. ? Feel sore or achy, even in parts of your body where you did not have surgery. This information is not intended to replace advice given  to you by your health care provider. Make sure you discuss any questions you have with your health care provider. Document Released: 04/21/2007 Document Revised: 06/25/2015 Document Reviewed: 12/27/2014 Elsevier Interactive Patient Education  2018 [employer] Anesthesia, Adult, Care After These instructions provide you with information about caring for yourself after your procedure. Your health care provider may also give you more specific instructions. Your treatment has been planned according to current medical practices, but problems sometimes occur. Call your health care provider if you have any problems or questions after your procedure. What can I expect after the procedure? After the procedure, it is common to have:  Vomiting.  A sore throat.  Mental slowness.  It is common to feel:  Nauseous.  Cold or shivery.  Sleepy.  Tired.  Sore or achy, even in parts of your body where you did not have surgery.  Follow these instructions at home: For at least 24 hours after the procedure:  Do not: ? Participate in activities where you could fall or become injured. ? Drive. ? Use heavy machinery. ? Drink alcohol. ? Take sleeping pills or medicines that cause drowsiness. ? Make important decisions or sign legal documents. ? Take care of children on your own.  Rest. Eating and drinking  If you vomit, drink water, juice, or soup when you can drink without vomiting.  Drink enough fluid to keep your urine clear or pale yellow.  Make sure you have little or no nausea before eating solid foods.  Follow the diet recommended by your health care provider. General instructions  Have a responsible  adult stay with you until you are awake and alert.  Return to your normal activities as told by your health care provider. Ask your health care provider what activities are safe for you.  Take over-the-counter and prescription medicines only as told by your health care  provider.  If you smoke, do not smoke without supervision.  Keep all follow-up visits as told by your health care provider. This is important. Contact a health care provider if:  You continue to have nausea or vomiting at home, and medicines are not helpful.  You cannot drink fluids or start eating again.  You cannot urinate after 8-12 hours.  You develop a skin rash.  You have fever.  You have increasing redness at the site of your procedure. Get help right away if:  You have difficulty breathing.  You have chest pain.  You have unexpected bleeding.  You feel that you are having a life-threatening or urgent problem. This information is not intended to replace advice given to you by your health care provider. Make sure you discuss any questions you have with your health care provider. Document Released: 04/20/2000 Document Revised: 06/17/2015 Document Reviewed: 12/27/2014 Elsevier Interactive Patient Education  [employer].

## 2016-08-13 ENCOUNTER — Other Ambulatory Visit: Payer: Self-pay | Admitting: *Deleted

## 2016-08-13 DIAGNOSIS — Z96651 Presence of right artificial knee joint: Secondary | ICD-10-CM

## 2016-08-14 ENCOUNTER — Encounter (HOSPITAL_COMMUNITY): Payer: Self-pay

## 2016-08-14 ENCOUNTER — Encounter (HOSPITAL_COMMUNITY)
Admission: RE | Admit: 2016-08-14 | Discharge: 2016-08-14 | Disposition: A | Discharge status: Home / Self Care | Payer: Medicare Other | Source: Ambulatory Visit | Attending: Orthopedic Surgery | Admitting: Orthopedic Surgery

## 2016-08-14 DIAGNOSIS — Z01812 Encounter for preprocedural laboratory examination: Secondary | ICD-10-CM | POA: Diagnosis not present

## 2016-08-14 HISTORY — DX: Anemia, unspecified: D64.9

## 2016-08-14 LAB — CBC WITH DIFFERENTIAL/PLATELET
BASOS PCT: 0 %
Basophils Absolute: 0 10*3/uL (ref 0.0–0.1)
EOS ABS: 0.3 10*3/uL (ref 0.0–0.7)
Eosinophils Relative: 3 %
HCT: 34 % — ABNORMAL LOW (ref 36.0–46.0)
HEMOGLOBIN: 10.5 g/dL — AB (ref 12.0–15.0)
Lymphocytes Relative: 26 %
Lymphs Abs: 2.9 10*3/uL (ref 0.7–4.0)
MCH: 22 pg — ABNORMAL LOW (ref 26.0–34.0)
MCHC: 30.9 g/dL (ref 30.0–36.0)
MCV: 71.1 fL — ABNORMAL LOW (ref 78.0–100.0)
Monocytes Absolute: 0.8 10*3/uL (ref 0.1–1.0)
Monocytes Relative: 7 %
NEUTROS PCT: 65 %
Neutro Abs: 7.3 10*3/uL (ref 1.7–7.7)
Platelets: 707 10*3/uL — ABNORMAL HIGH (ref 150–400)
RBC: 4.78 MIL/uL (ref 3.87–5.11)
RDW: 17.1 % — ABNORMAL HIGH (ref 11.5–15.5)
WBC: 11.2 10*3/uL — AB (ref 4.0–10.5)

## 2016-08-14 LAB — BASIC METABOLIC PANEL
Anion gap: 10 (ref 5–15)
BUN: 23 mg/dL — ABNORMAL HIGH (ref 6–20)
CALCIUM: 9.3 mg/dL (ref 8.9–10.3)
CO2: 27 mmol/L (ref 22–32)
CREATININE: 1.09 mg/dL — AB (ref 0.44–1.00)
Chloride: 96 mmol/L — ABNORMAL LOW (ref 101–111)
GFR calc non Af Amer: 52 mL/min — ABNORMAL LOW (ref >60)
Glucose, Bld: 114 mg/dL — ABNORMAL HIGH (ref 65–99)
Potassium: 4.5 mmol/L (ref 3.5–5.1)
SODIUM: 133 mmol/L — AB (ref 135–145)

## 2016-08-14 LAB — APTT: aPTT: 29 seconds (ref 24–36)

## 2016-08-14 LAB — PROTIME-INR
INR: 0.98
PROTHROMBIN TIME: 13 s (ref 11.4–15.2)

## 2016-08-14 LAB — SURGICAL PCR SCREEN
MRSA, PCR: NEGATIVE
Staphylococcus aureus: NEGATIVE

## 2016-08-15 LAB — PREPARE RBC (CROSSMATCH)

## 2016-08-17 NOTE — Pre-Procedure Instructions (Signed)
Dr Gonzalez aware of H&H. No orders given. 

## 2016-08-18 MED ORDER — TRANEXAMIC ACID 1000 MG/10ML IV SOLN
1000.0000 mg | INTRAVENOUS | Status: AC
  Administered 2016-08-19: 1000 mg via INTRAVENOUS
  Filled 2016-08-18: qty 10

## 2016-08-18 NOTE — H&P (Signed)
TOTAL KNEE ADMISSION H&P  Patient is being admitted for right total knee arthroplasty.  Subjective:  Chief Complaint:right knee pain.  HPI: Rhonda Briggs, 66 y.o. female, has a history of pain and functional disability in the right knee due to arthritis and has failed non-surgical conservative treatments for greater than 12 weeks to includeNSAID's and/or analgesics, corticosteriod injections and activity modification.  Onset of symptoms was gradual, starting > 5  years ago with gradually worsening course since that time. The patient noted no past surgery on the right knee(s).  Patient currently rates pain in the right knee(s) at 9 out of 10 with activity. Patient has night pain, worsening of pain with activity and weight bearing, pain that interferes with activities of daily living, pain with passive range of motion, crepitus and joint swelling.  Patient has evidence of subchondral cysts, subchondral sclerosis and joint space narrowing by imaging studies. This patient has had ho infection. There is no active infection.  There are no active problems to display for this patient.  Past Medical History:  Diagnosis Date  . Anemia   . Arthritis   . Hypertension   . Osteoporosis   . PONV (postoperative nausea and vomiting)    after hernia surgery    Past Surgical History:  Procedure Laterality Date  . CARPAL TUNNEL RELEASE Right 2011  . HERNIA REPAIR Left    inguinal  . TUBAL LIGATION      No prescriptions prior to admission.   No Known Allergies  Social History  Substance Use Topics  . Smoking status: Current Every Day Smoker    Packs/day: 0.50    Years: 48.00    Types: Cigarettes  . Smokeless tobacco: Never Used  . Alcohol use No    Family History  Problem Relation Age of Onset  . Lung disease Mother   . Cancer Father   . Urticaria Neg Hx   . Eczema Neg Hx   . Immunodeficiency Neg Hx   . Atopy Neg Hx   . Asthma Neg Hx   . Angioedema Neg Hx   . Allergic rhinitis Neg Hx      Review of Systems  Musculoskeletal: Positive for back pain and joint pain.  Neurological: Negative for tingling and speech change.  Endo/Heme/Allergies: Does not bruise/bleed easily.  All other systems reviewed and are negative.   Objective:  Physical Exam  Constitutional: She is oriented to person, place, and time. She appears well-nourished.  Eyes: Right eye exhibits no discharge. Left eye exhibits no discharge. No scleral icterus.  Neck: Neck supple. No JVD present. No tracheal deviation present.  Cardiovascular: Intact distal pulses.   Respiratory: Effort normal. No stridor.  GI: Soft. She exhibits no distension.  Neurological: She is alert and oriented to person, place, and time. She has normal reflexes. She exhibits normal muscle tone. Coordination normal.  Skin: Skin is warm and dry. No rash noted. No erythema. No pallor.  Psychiatric: She has a normal mood and affect. Her behavior is normal. Thought content normal.   5. Ambulation MILD LIMP    Examination reveals the following: 6. On inspection we find that the right knee and the left knee medial and lateral joint line tenderness   7. With the range of motion of  left knee 125 right knee 120 with 5 flexion contracture in each   8. Stability tests were normal  in each knee   9. Strength tests revealed grade 5 motor strength in each quad   10. Skin  we find no rash ulceration or erythema in the right and left leg   11. Sensation remains intact right and left leg   12 Vascular system shows no peripheral edema right and left leg  Vital signs in last 24 hours:    Labs:   Estimated body mass index is 26.01 kg/m as calculated from the following:   Height as of 08/14/16: 5\' 2"  (1.575 m).   Weight as of 08/14/16: 142 lb 3.2 oz (64.5 kg).   Imaging Review Plain radiographs demonstrate moderate degenerative joint disease of the right knee(s). The overall alignment ismild valgus. The bone quality appears to be fair for  age and reported activity level.  Assessment/Plan:  End stage arthritis, right knee   The patient history, physical examination, clinical judgment of the provider and imaging studies are consistent with end stage degenerative joint disease of the right knee(s) and total knee arthroplasty is deemed medically necessary. The treatment options including medical management, injection therapy arthroscopy and arthroplasty were discussed at length. The risks and benefits of total knee arthroplasty were presented and reviewed. The risks due to aseptic loosening, infection, stiffness, patella tracking problems, thromboembolic complications and other imponderables were discussed. The patient acknowledged the explanation, agreed to proceed with the plan and consent was signed. Patient is being admitted for inpatient treatment for surgery, pain control, PT, OT, prophylactic antibiotics, VTE prophylaxis, progressive ambulation and ADL's and discharge planning. The patient is planning to be discharged to skilled nursing facility

## 2016-08-19 ENCOUNTER — Encounter (HOSPITAL_COMMUNITY): Payer: Self-pay | Admitting: *Deleted

## 2016-08-19 ENCOUNTER — Inpatient Hospital Stay (HOSPITAL_COMMUNITY)
Admission: RE | Admit: 2016-08-19 | Discharge: 2016-08-21 | DRG: 470 | Disposition: A | Discharge status: Home / Self Care | Payer: Medicare Other | Source: Ambulatory Visit | Attending: Orthopedic Surgery | Admitting: Orthopedic Surgery

## 2016-08-19 ENCOUNTER — Inpatient Hospital Stay (HOSPITAL_COMMUNITY): Payer: Medicare Other

## 2016-08-19 ENCOUNTER — Inpatient Hospital Stay (HOSPITAL_COMMUNITY): Payer: Medicare Other | Admitting: Anesthesiology

## 2016-08-19 ENCOUNTER — Encounter (HOSPITAL_COMMUNITY)
Admission: RE | Disposition: A | Discharge status: Home / Self Care | Payer: Self-pay | Source: Ambulatory Visit | Attending: Orthopedic Surgery

## 2016-08-19 DIAGNOSIS — M1711 Unilateral primary osteoarthritis, right knee: Secondary | ICD-10-CM | POA: Diagnosis present

## 2016-08-19 DIAGNOSIS — M1991 Primary osteoarthritis, unspecified site: Secondary | ICD-10-CM | POA: Diagnosis present

## 2016-08-19 DIAGNOSIS — M659 Synovitis and tenosynovitis, unspecified: Secondary | ICD-10-CM | POA: Diagnosis present

## 2016-08-19 DIAGNOSIS — F17210 Nicotine dependence, cigarettes, uncomplicated: Secondary | ICD-10-CM | POA: Diagnosis present

## 2016-08-19 DIAGNOSIS — Z9851 Tubal ligation status: Secondary | ICD-10-CM | POA: Diagnosis not present

## 2016-08-19 DIAGNOSIS — Z9889 Other specified postprocedural states: Secondary | ICD-10-CM

## 2016-08-19 DIAGNOSIS — M2100 Valgus deformity, not elsewhere classified, unspecified site: Secondary | ICD-10-CM | POA: Diagnosis present

## 2016-08-19 DIAGNOSIS — Z809 Family history of malignant neoplasm, unspecified: Secondary | ICD-10-CM | POA: Diagnosis not present

## 2016-08-19 DIAGNOSIS — M810 Age-related osteoporosis without current pathological fracture: Secondary | ICD-10-CM | POA: Diagnosis present

## 2016-08-19 DIAGNOSIS — I10 Essential (primary) hypertension: Secondary | ICD-10-CM | POA: Diagnosis present

## 2016-08-19 DIAGNOSIS — Z96651 Presence of right artificial knee joint: Secondary | ICD-10-CM | POA: Diagnosis not present

## 2016-08-19 HISTORY — PX: TOTAL KNEE ARTHROPLASTY: SHX125

## 2016-08-19 SURGERY — ARTHROPLASTY, KNEE, TOTAL
Anesthesia: Spinal | Laterality: Right

## 2016-08-19 MED ORDER — DEXAMETHASONE SODIUM PHOSPHATE 10 MG/ML IJ SOLN
10.0000 mg | Freq: Once | INTRAMUSCULAR | Status: AC
Start: 2016-08-20 — End: 2016-08-20
  Administered 2016-08-20: 10 mg via INTRAVENOUS
  Filled 2016-08-19: qty 1

## 2016-08-19 MED ORDER — FENTANYL CITRATE (PF) 100 MCG/2ML IJ SOLN
INTRAMUSCULAR | Status: DC | PRN
  Administered 2016-08-19: 25 ug via INTRATHECAL

## 2016-08-19 MED ORDER — BUPIVACAINE-EPINEPHRINE (PF) 0.5% -1:200000 IJ SOLN
INTRAMUSCULAR | Status: AC
  Filled 2016-08-19: qty 30

## 2016-08-19 MED ORDER — ONDANSETRON HCL 4 MG/2ML IJ SOLN
INTRAMUSCULAR | Status: AC
  Filled 2016-08-19: qty 2

## 2016-08-19 MED ORDER — FENTANYL CITRATE (PF) 100 MCG/2ML IJ SOLN
25.0000 ug | Freq: Once | INTRAMUSCULAR | Status: AC
  Administered 2016-08-19: 25 ug via INTRAVENOUS

## 2016-08-19 MED ORDER — SODIUM CHLORIDE 0.9 % IV SOLN
1000.0000 mg | Freq: Once | INTRAVENOUS | Status: AC
  Administered 2016-08-19: 1000 mg via INTRAVENOUS
  Filled 2016-08-19: qty 10

## 2016-08-19 MED ORDER — ASPIRIN EC 325 MG PO TBEC
325.0000 mg | DELAYED_RELEASE_TABLET | Freq: Every day | ORAL | Status: DC
  Administered 2016-08-20 – 2016-08-21 (×2): 325 mg via ORAL
  Filled 2016-08-19 (×2): qty 1

## 2016-08-19 MED ORDER — MIDAZOLAM HCL 2 MG/2ML IJ SOLN
INTRAMUSCULAR | Status: AC
  Filled 2016-08-19: qty 2

## 2016-08-19 MED ORDER — SENNOSIDES-DOCUSATE SODIUM 8.6-50 MG PO TABS: 1.0000 | ORAL_TABLET | Freq: Every evening | ORAL | Status: DC | PRN

## 2016-08-19 MED ORDER — HYDROCODONE-ACETAMINOPHEN 7.5-325 MG PO TABS
1.0000 | ORAL_TABLET | Freq: Once | ORAL | Status: AC
  Administered 2016-08-19: 1 via ORAL
  Filled 2016-08-19: qty 1

## 2016-08-19 MED ORDER — PREGABALIN 50 MG PO CAPS
50.0000 mg | ORAL_CAPSULE | Freq: Once | ORAL | Status: AC
  Administered 2016-08-19: 50 mg via ORAL
  Filled 2016-08-19: qty 1

## 2016-08-19 MED ORDER — ACETAMINOPHEN 650 MG RE SUPP: 650.0000 mg | Freq: Four times a day (QID) | RECTAL | Status: DC | PRN

## 2016-08-19 MED ORDER — HYDROMORPHONE HCL 1 MG/ML IJ SOLN
0.5000 mg | INTRAMUSCULAR | Status: DC | PRN
  Administered 2016-08-19 – 2016-08-20 (×7): 0.5 mg via INTRAVENOUS
  Filled 2016-08-19 (×7): qty 1

## 2016-08-19 MED ORDER — OXYCODONE HCL 5 MG PO TABS
5.0000 mg | ORAL_TABLET | ORAL | Status: DC | PRN
  Administered 2016-08-19 – 2016-08-21 (×5): 10 mg via ORAL
  Filled 2016-08-19 (×4): qty 2
  Filled 2016-08-19: qty 1
  Filled 2016-08-19: qty 2

## 2016-08-19 MED ORDER — BUPIVACAINE-EPINEPHRINE (PF) 0.5% -1:200000 IJ SOLN
INTRAMUSCULAR | Status: DC | PRN
  Administered 2016-08-19: 30 mL via PERINEURAL

## 2016-08-19 MED ORDER — METOCLOPRAMIDE HCL 5 MG/ML IJ SOLN: 5.0000 mg | Freq: Three times a day (TID) | INTRAMUSCULAR | Status: DC | PRN

## 2016-08-19 MED ORDER — SODIUM CHLORIDE 0.9 % IV SOLN
INTRAVENOUS | Status: DC
  Administered 2016-08-19 (×2): via INTRAVENOUS

## 2016-08-19 MED ORDER — BUPIVACAINE LIPOSOME 1.3 % IJ SUSP
20.0000 mL | Freq: Once | INTRAMUSCULAR | Status: DC
  Filled 2016-08-19: qty 20

## 2016-08-19 MED ORDER — PHENYLEPHRINE HCL 10 MG/ML IJ SOLN
INTRAMUSCULAR | Status: DC | PRN
  Administered 2016-08-19 (×3): 40 ug via INTRAVENOUS

## 2016-08-19 MED ORDER — PHENYLEPHRINE 40 MCG/ML (10ML) SYRINGE FOR IV PUSH (FOR BLOOD PRESSURE SUPPORT)
PREFILLED_SYRINGE | INTRAVENOUS | Status: AC
  Filled 2016-08-19: qty 10

## 2016-08-19 MED ORDER — BUPIVACAINE LIPOSOME 1.3 % IJ SUSP
INTRAMUSCULAR | Status: AC
  Filled 2016-08-19: qty 20

## 2016-08-19 MED ORDER — KETOROLAC TROMETHAMINE 30 MG/ML IJ SOLN
INTRAMUSCULAR | Status: AC
  Filled 2016-08-19: qty 1

## 2016-08-19 MED ORDER — NICOTINE 14 MG/24HR TD PT24
14.0000 mg | MEDICATED_PATCH | Freq: Every day | TRANSDERMAL | Status: DC
  Administered 2016-08-19 – 2016-08-21 (×3): 14 mg via TRANSDERMAL
  Filled 2016-08-19 (×3): qty 1

## 2016-08-19 MED ORDER — ONDANSETRON HCL 4 MG/2ML IJ SOLN
4.0000 mg | Freq: Once | INTRAMUSCULAR | Status: AC
  Administered 2016-08-19: 4 mg via INTRAVENOUS

## 2016-08-19 MED ORDER — CEFAZOLIN SODIUM-DEXTROSE 2-4 GM/100ML-% IV SOLN
2.0000 g | INTRAVENOUS | Status: AC
  Administered 2016-08-19: 2 g via INTRAVENOUS
  Filled 2016-08-19: qty 100

## 2016-08-19 MED ORDER — DIPHENHYDRAMINE HCL 12.5 MG/5ML PO ELIX: 12.5000 mg | ORAL_SOLUTION | ORAL | Status: DC | PRN

## 2016-08-19 MED ORDER — BISACODYL 5 MG PO TBEC: 5.0000 mg | DELAYED_RELEASE_TABLET | Freq: Every day | ORAL | Status: DC | PRN

## 2016-08-19 MED ORDER — ACETAMINOPHEN 500 MG PO TABS
500.0000 mg | ORAL_TABLET | Freq: Once | ORAL | Status: AC
  Administered 2016-08-19: 500 mg via ORAL
  Filled 2016-08-19: qty 1

## 2016-08-19 MED ORDER — ACETAMINOPHEN 325 MG PO TABS: 650.0000 mg | ORAL_TABLET | Freq: Four times a day (QID) | ORAL | Status: DC | PRN

## 2016-08-19 MED ORDER — ONDANSETRON HCL 4 MG/2ML IJ SOLN
4.0000 mg | Freq: Four times a day (QID) | INTRAMUSCULAR | Status: DC | PRN
  Administered 2016-08-19: 4 mg via INTRAVENOUS
  Filled 2016-08-19: qty 2

## 2016-08-19 MED ORDER — ONDANSETRON HCL 4 MG/2ML IJ SOLN
4.0000 mg | Freq: Once | INTRAMUSCULAR | Status: AC
  Administered 2016-08-19: 4 mg via INTRAVENOUS
  Filled 2016-08-19: qty 2

## 2016-08-19 MED ORDER — LACTATED RINGERS IV SOLN
INTRAVENOUS | Status: DC
  Administered 2016-08-19 (×2): via INTRAVENOUS

## 2016-08-19 MED ORDER — METHOCARBAMOL 1000 MG/10ML IJ SOLN
500.0000 mg | Freq: Four times a day (QID) | INTRAVENOUS | Status: DC | PRN
  Filled 2016-08-19: qty 5

## 2016-08-19 MED ORDER — KETOROLAC TROMETHAMINE 30 MG/ML IJ SOLN
7.5000 mg | Freq: Four times a day (QID) | INTRAMUSCULAR | Status: AC
  Administered 2016-08-19 – 2016-08-20 (×4): 7.5 mg via INTRAVENOUS
  Filled 2016-08-19 (×4): qty 1

## 2016-08-19 MED ORDER — PRAMIPEXOLE DIHYDROCHLORIDE 0.25 MG PO TABS
0.2500 mg | ORAL_TABLET | Freq: Every day | ORAL | Status: DC
  Administered 2016-08-19 – 2016-08-20 (×2): 0.25 mg via ORAL
  Filled 2016-08-19 (×5): qty 1

## 2016-08-19 MED ORDER — EPHEDRINE SULFATE 50 MG/ML IJ SOLN
INTRAMUSCULAR | Status: DC | PRN
  Administered 2016-08-19: 10 mg via INTRAVENOUS

## 2016-08-19 MED ORDER — FENTANYL CITRATE (PF) 100 MCG/2ML IJ SOLN
25.0000 ug | INTRAMUSCULAR | Status: DC | PRN
  Administered 2016-08-19 (×4): 50 ug via INTRAVENOUS
  Filled 2016-08-19 (×2): qty 2

## 2016-08-19 MED ORDER — PHENOL 1.4 % MT LIQD: 1.0000 | OROMUCOSAL | Status: DC | PRN

## 2016-08-19 MED ORDER — 0.9 % SODIUM CHLORIDE (POUR BTL) OPTIME
TOPICAL | Status: DC | PRN
  Administered 2016-08-19: 1000 mL

## 2016-08-19 MED ORDER — CEFAZOLIN SODIUM-DEXTROSE 2-4 GM/100ML-% IV SOLN
2.0000 g | Freq: Four times a day (QID) | INTRAVENOUS | Status: AC
  Administered 2016-08-19: 2 g via INTRAVENOUS
  Filled 2016-08-19 (×2): qty 100

## 2016-08-19 MED ORDER — SUCCINYLCHOLINE CHLORIDE 20 MG/ML IJ SOLN
INTRAMUSCULAR | Status: AC
  Filled 2016-08-19: qty 1

## 2016-08-19 MED ORDER — SODIUM CHLORIDE 0.9 % IJ SOLN
INTRAMUSCULAR | Status: AC
  Filled 2016-08-19: qty 10

## 2016-08-19 MED ORDER — CIPROFLOXACIN IN D5W 400 MG/200ML IV SOLN
INTRAVENOUS | Status: AC
  Filled 2016-08-19: qty 200

## 2016-08-19 MED ORDER — MENTHOL 3 MG MT LOZG: 1.0000 | LOZENGE | OROMUCOSAL | Status: DC | PRN

## 2016-08-19 MED ORDER — PROPOFOL 10 MG/ML IV BOLUS
INTRAVENOUS | Status: AC
  Filled 2016-08-19: qty 40

## 2016-08-19 MED ORDER — DOCUSATE SODIUM 100 MG PO CAPS
100.0000 mg | ORAL_CAPSULE | Freq: Two times a day (BID) | ORAL | Status: DC
  Administered 2016-08-19 – 2016-08-21 (×5): 100 mg via ORAL
  Filled 2016-08-19 (×5): qty 1

## 2016-08-19 MED ORDER — MIDAZOLAM HCL 2 MG/2ML IJ SOLN
1.0000 mg | INTRAMUSCULAR | Status: AC
  Administered 2016-08-19 (×2): 2 mg via INTRAVENOUS
  Filled 2016-08-19: qty 2

## 2016-08-19 MED ORDER — MIDAZOLAM HCL 5 MG/5ML IJ SOLN
INTRAMUSCULAR | Status: DC | PRN
  Administered 2016-08-19 (×2): 1 mg via INTRAVENOUS

## 2016-08-19 MED ORDER — EPHEDRINE SULFATE 50 MG/ML IJ SOLN
INTRAMUSCULAR | Status: AC
  Filled 2016-08-19: qty 1

## 2016-08-19 MED ORDER — GABAPENTIN 100 MG PO CAPS
200.0000 mg | ORAL_CAPSULE | Freq: Three times a day (TID) | ORAL | Status: DC
  Administered 2016-08-19 – 2016-08-21 (×6): 200 mg via ORAL
  Filled 2016-08-19 (×6): qty 2

## 2016-08-19 MED ORDER — BUPIVACAINE IN DEXTROSE 0.75-8.25 % IT SOLN
INTRATHECAL | Status: DC | PRN
  Administered 2016-08-19: 13 mg via INTRATHECAL

## 2016-08-19 MED ORDER — CELECOXIB 100 MG PO CAPS
200.0000 mg | ORAL_CAPSULE | Freq: Every day | ORAL | Status: DC
  Administered 2016-08-20 – 2016-08-21 (×2): 200 mg via ORAL
  Filled 2016-08-19 (×2): qty 2

## 2016-08-19 MED ORDER — METHOCARBAMOL 1000 MG/10ML IJ SOLN
500.0000 mg | Freq: Once | INTRAVENOUS | Status: AC
  Administered 2016-08-19: 500 mg via INTRAVENOUS
  Filled 2016-08-19: qty 5

## 2016-08-19 MED ORDER — SODIUM CHLORIDE 0.9 % IJ SOLN
INTRAMUSCULAR | Status: AC
  Filled 2016-08-19: qty 40

## 2016-08-19 MED ORDER — CELECOXIB 400 MG PO CAPS
ORAL_CAPSULE | ORAL | Status: AC
  Filled 2016-08-19: qty 1

## 2016-08-19 MED ORDER — PROPOFOL 500 MG/50ML IV EMUL
INTRAVENOUS | Status: DC | PRN
  Administered 2016-08-19: 50 ug/kg/min via INTRAVENOUS

## 2016-08-19 MED ORDER — CELECOXIB 400 MG PO CAPS
400.0000 mg | ORAL_CAPSULE | Freq: Once | ORAL | Status: AC
  Administered 2016-08-19: 400 mg via ORAL
  Filled 2016-08-19: qty 1

## 2016-08-19 MED ORDER — FENTANYL CITRATE (PF) 100 MCG/2ML IJ SOLN
INTRAMUSCULAR | Status: AC
  Filled 2016-08-19: qty 2

## 2016-08-19 MED ORDER — BUPIVACAINE LIPOSOME 1.3 % IJ SUSP
INTRAMUSCULAR | Status: DC | PRN
  Administered 2016-08-19: 60 mL

## 2016-08-19 MED ORDER — ONDANSETRON HCL 4 MG PO TABS: 4.0000 mg | ORAL_TABLET | Freq: Four times a day (QID) | ORAL | Status: DC | PRN

## 2016-08-19 MED ORDER — MAGNESIUM CITRATE PO SOLN: 1.0000 | Freq: Once | ORAL | Status: DC | PRN

## 2016-08-19 MED ORDER — SODIUM CHLORIDE 0.9 % IR SOLN
Status: DC | PRN
  Administered 2016-08-19: 3000 mL

## 2016-08-19 MED ORDER — CHLORHEXIDINE GLUCONATE 4 % EX LIQD: 60.0000 mL | Freq: Once | CUTANEOUS | Status: DC

## 2016-08-19 MED ORDER — METOCLOPRAMIDE HCL 10 MG PO TABS: 5.0000 mg | ORAL_TABLET | Freq: Three times a day (TID) | ORAL | Status: DC | PRN

## 2016-08-19 MED ORDER — FERROUS SULFATE 325 (65 FE) MG PO TABS
325.0000 mg | ORAL_TABLET | Freq: Three times a day (TID) | ORAL | Status: DC
  Administered 2016-08-19 – 2016-08-21 (×5): 325 mg via ORAL
  Filled 2016-08-19 (×5): qty 1

## 2016-08-19 MED ORDER — ALUM & MAG HYDROXIDE-SIMETH 200-200-20 MG/5ML PO SUSP: 30.0000 mL | ORAL | Status: DC | PRN

## 2016-08-19 MED ORDER — HYDROCHLOROTHIAZIDE 25 MG PO TABS
25.0000 mg | ORAL_TABLET | Freq: Every day | ORAL | Status: DC
  Administered 2016-08-19 – 2016-08-21 (×3): 25 mg via ORAL
  Filled 2016-08-19 (×3): qty 1

## 2016-08-19 MED ORDER — METHOCARBAMOL 500 MG PO TABS: 500.0000 mg | ORAL_TABLET | Freq: Four times a day (QID) | ORAL | Status: DC | PRN

## 2016-08-19 SURGICAL SUPPLY — 70 items
BAG HAMPER (MISCELLANEOUS) ×3 IMPLANT
BANDAGE ELASTIC 4 VELCRO ST LF (GAUZE/BANDAGES/DRESSINGS) ×3 IMPLANT
BANDAGE ESMARK 6X9 LF (GAUZE/BANDAGES/DRESSINGS) ×1 IMPLANT
BIT DRILL 3.2X128 (BIT) IMPLANT
BIT DRILL 3.2X128MM (BIT)
BLADE HEX COATED 2.75 (ELECTRODE) ×3 IMPLANT
BLADE SAGITTAL 25.0X1.27X90 (BLADE) ×2 IMPLANT
BLADE SAGITTAL 25.0X1.27X90MM (BLADE) ×1
BNDG ESMARK 6X9 LF (GAUZE/BANDAGES/DRESSINGS) ×3
BOWL SMART MIX CTS (DISPOSABLE) IMPLANT
CAP KNEE TOTAL 3 SIGMA ×3 IMPLANT
CEMENT HV SMART SET (Cement) ×6 IMPLANT
CLOTH BEACON ORANGE TIMEOUT ST (SAFETY) ×3 IMPLANT
COOLER CRYO CUFF IC AND MOTOR (MISCELLANEOUS) ×3 IMPLANT
COVER LIGHT HANDLE STERIS (MISCELLANEOUS) ×6 IMPLANT
CUFF CRYO KNEE LG 20X31 COOLER (ORTHOPEDIC SUPPLIES) IMPLANT
CUFF CRYO KNEE18X23 MED (MISCELLANEOUS) ×3 IMPLANT
CUFF TOURNIQUET SINGLE 34IN LL (TOURNIQUET CUFF) ×3 IMPLANT
CUFF TOURNIQUET SINGLE 44IN (TOURNIQUET CUFF) IMPLANT
DECANTER SPIKE VIAL GLASS SM (MISCELLANEOUS) ×3 IMPLANT
DRAPE BACK TABLE (DRAPES) ×3 IMPLANT
DRAPE EXTREMITY T 121X128X90 (DRAPE) ×3 IMPLANT
DRESSING AQUACEL AG ADV 3.5X12 (MISCELLANEOUS) ×1 IMPLANT
DRSG AQUACEL AG ADV 3.5X12 (MISCELLANEOUS) ×3
DRSG MEPILEX BORDER 4X12 (GAUZE/BANDAGES/DRESSINGS) ×3 IMPLANT
DURAPREP 26ML APPLICATOR (WOUND CARE) ×6 IMPLANT
ELECT REM PT RETURN 9FT ADLT (ELECTROSURGICAL) ×3
ELECTRODE REM PT RTRN 9FT ADLT (ELECTROSURGICAL) ×1 IMPLANT
EVACUATOR 3/16  PVC DRAIN (DRAIN) ×2
EVACUATOR 3/16 PVC DRAIN (DRAIN) ×1 IMPLANT
GLOVE BIO SURGEON STRL SZ7 (GLOVE) ×6 IMPLANT
GLOVE BIOGEL PI IND STRL 7.0 (GLOVE) ×2 IMPLANT
GLOVE BIOGEL PI INDICATOR 7.0 (GLOVE) ×4
GLOVE SKINSENSE NS SZ8.0 LF (GLOVE) ×4
GLOVE SKINSENSE STRL SZ8.0 LF (GLOVE) ×2 IMPLANT
GLOVE SS N UNI LF 8.5 STRL (GLOVE) ×3 IMPLANT
GOWN STRL REUS W/ TWL LRG LVL3 (GOWN DISPOSABLE) ×1 IMPLANT
GOWN STRL REUS W/TWL LRG LVL3 (GOWN DISPOSABLE) ×8 IMPLANT
GOWN STRL REUS W/TWL XL LVL3 (GOWN DISPOSABLE) ×3 IMPLANT
HANDPIECE INTERPULSE COAX TIP (DISPOSABLE) ×2
HOOD W/PEELAWAY (MISCELLANEOUS) ×12 IMPLANT
INST SET MAJOR BONE (KITS) ×3 IMPLANT
IV NS IRRIG 3000ML ARTHROMATIC (IV SOLUTION) ×3 IMPLANT
KIT BLADEGUARD II DBL (SET/KITS/TRAYS/PACK) ×3 IMPLANT
KIT ROOM TURNOVER APOR (KITS) ×3 IMPLANT
MANIFOLD NEPTUNE II (INSTRUMENTS) ×3 IMPLANT
MARKER SKIN DUAL TIP RULER LAB (MISCELLANEOUS) ×3 IMPLANT
NEEDLE HYPO 21X1.5 SAFETY (NEEDLE) ×3 IMPLANT
NS IRRIG 1000ML POUR BTL (IV SOLUTION) ×3 IMPLANT
PACK TOTAL JOINT (CUSTOM PROCEDURE TRAY) ×3 IMPLANT
PAD ARMBOARD 7.5X6 YLW CONV (MISCELLANEOUS) ×3 IMPLANT
PAD DANNIFLEX CPM (ORTHOPEDIC SUPPLIES) ×3 IMPLANT
PIN TROCAR 3 INCH (PIN) IMPLANT
SAW OSC TIP CART 19.5X105X1.3 (SAW) ×3 IMPLANT
SET BASIN LINEN APH (SET/KITS/TRAYS/PACK) ×3 IMPLANT
SET HNDPC FAN SPRY TIP SCT (DISPOSABLE) ×1 IMPLANT
STAPLER VISISTAT 35W (STAPLE) ×3 IMPLANT
SUT BRALON NAB BRD #1 30IN (SUTURE) ×6 IMPLANT
SUT MNCRL 0 VIOLET CTX 36 (SUTURE) ×1 IMPLANT
SUT MON AB 0 CT1 (SUTURE) ×3 IMPLANT
SUT MON AB 2-0 CT1 36 (SUTURE) IMPLANT
SUT MONOCRYL 0 CTX 36 (SUTURE) ×2
SYR 20CC LL (SYRINGE) IMPLANT
SYR 30ML LL (SYRINGE) ×3 IMPLANT
SYR BULB IRRIGATION 50ML (SYRINGE) ×3 IMPLANT
TOWEL OR 17X26 4PK STRL BLUE (TOWEL DISPOSABLE) ×3 IMPLANT
TOWER CARTRIDGE SMART MIX (DISPOSABLE) ×3 IMPLANT
TRAY FOLEY W/METER SILVER 16FR (SET/KITS/TRAYS/PACK) ×3 IMPLANT
WATER STERILE IRR 1000ML POUR (IV SOLUTION) ×6 IMPLANT
YANKAUER SUCT 12FT TUBE ARGYLE (SUCTIONS) ×3 IMPLANT

## 2016-08-19 NOTE — Anesthesia Procedure Notes (Signed)
Spinal  Patient location during procedure: OR Start time: 08/19/2016 7:43 AM Staffing Resident/CRNA: Anara Cowman A Preanesthetic Checklist Completed: patient identified, site marked, surgical consent, pre-op evaluation, timeout performed, IV checked, risks and benefits discussed and monitors and equipment checked Spinal Block Patient position: right lateral decubitus Prep: Betadine Patient monitoring: heart rate, cardiac monitor, continuous pulse ox and blood pressure Approach: right paramedian Location: L3-4 Injection technique: single-shot Needle Needle type: Spinocan  Needle gauge: 22 G Needle length: 9 cm Assessment Sensory level: T8 Additional Notes ATTEMPTS:1 TRAY SK:[id number] TRAY EXPIRATION DATE:10/26/18

## 2016-08-19 NOTE — Interval H&P Note (Signed)
History and Physical Interval Note:  08/19/2016 7:17 AM  Rhonda Briggs  has presented today for surgery, with the diagnosis of RIGHT KNEE OSTEOARTHRITIS  The various methods of treatment have been discussed with the patient and family. After consideration of risks, benefits and other options for treatment, the patient has consented to  Procedure(s): TOTAL KNEE ARTHROPLASTY (Right) as a surgical intervention .  The patient's history has been reviewed, patient examined, no change in status, stable for surgery.  I have reviewed the patient's chart and labs.  Questions were answered to the patient's satisfaction.     Arther Abbott

## 2016-08-19 NOTE — Care Management Note (Addendum)
Case Management Note  Patient Details  Name: Rhonda Briggs MRN: [id number] Date of Birth: Nov 12, 1950  Subjective/Objective: s/p TKR - right. Recommended for [facility] PT. Patient would like [facility] health for [name] and [facility] for DME needs. [facility] does not have available PT until August 6th. Patient will be referred to [facility]. [name] of [facility] is aware of referral.                Action/Plan: Anticipate Return home with home health PT. Will need HH order/f66f. CM will send referral for DME to [facility].   Expected Discharge Date:     08/21/2016             Expected Discharge Plan:  [place]  In-House Referral:     Discharge planning Services  CM Consult  Post Acute Care Choice:  Home Health Choice offered to:  Patient  DME Arranged:  3-N-1, Walker rolling (pediatric) DME Agency:  [place]:  PT HH Agency:  [place] Status of Service:  In process, will continue to follow  If discussed at Long Length of Stay Meetings, dates discussed:    Additional Comments:  Chakara Bognar, Chauncey Reading, RN 08/19/2016, 2:43 PM

## 2016-08-19 NOTE — Transfer of Care (Signed)
Immediate Anesthesia Transfer of Care Note  Patient: Rhonda Briggs  Procedure(s) Performed: Procedure(s): TOTAL KNEE ARTHROPLASTY (Right)  Patient Location: PACU  Anesthesia Type:Spinal  Level of Consciousness: awake, alert , oriented and patient cooperative  Airway & Oxygen Therapy: Patient Spontanous Breathing and Patient connected to nasal cannula oxygen  Post-op Assessment: Report given to RN and Post -op Vital signs reviewed and stable  Post vital signs: Reviewed and stable  Last Vitals:  Vitals:   08/19/16 0710 08/19/16 0720  BP: (!) 154/132 117/69  Resp: (!) 38 19  Temp:      Last Pain:  Vitals:   08/19/16 0621  TempSrc: Oral  PainSc: 8       Patients Stated Pain Goal: 9 (67/01/10 0349)  Complications: No apparent anesthesia complications

## 2016-08-19 NOTE — Anesthesia Procedure Notes (Signed)
Procedure Name: MAC Date/Time: 08/19/2016 7:24 AM Performed by: Andree Elk, AMY A Pre-anesthesia Checklist: Patient identified, Timeout performed, Emergency Drugs available, Suction available and Patient being monitored Oxygen Delivery Method: Simple face mask

## 2016-08-19 NOTE — Progress Notes (Signed)
OT Cancellation Note  Patient Details Name: Rhonda Briggs MRN: [id number] DOB: April 29, 1950   Cancelled Treatment:    Reason Eval/Treat Not Completed: OT screened, no needs identified, will sign off. Pt screened for OT needs, limiting in LB tasks due to pain, husband will be available to assist with ADL completion as needed. No further OT needs at this time.    Guadelupe Sabin, OTR/L  [phone] 08/19/2016, 3:48 PM

## 2016-08-19 NOTE — Anesthesia Preprocedure Evaluation (Signed)
Anesthesia Evaluation  Patient identified by MRN, date of birth, ID band Patient awake    Reviewed: Allergy & Precautions, NPO status , Patient's Chart, lab work & pertinent test results  History of Anesthesia Complications (+) PONV and history of anesthetic complications  Airway Mallampati: I  TM Distance: >3 FB     Dental  (+) Teeth Intact   Pulmonary Current Smoker,    breath sounds clear to auscultation       Cardiovascular hypertension, Pt. on medications  Rhythm:Regular Rate:Normal     Neuro/Psych negative neurological ROS  negative psych ROS   GI/Hepatic negative GI ROS, Neg liver ROS,   Endo/Other  negative endocrine ROS  Renal/GU negative Renal ROS     Musculoskeletal  (+) Arthritis ,   Abdominal   Peds  Hematology  (+) anemia ,   Anesthesia Other Findings   Reproductive/Obstetrics                             Anesthesia Physical Anesthesia Plan  ASA: II  Anesthesia Plan: Spinal   Post-op Pain Management:    Induction:   PONV Risk Score and Plan:   Airway Management Planned: Simple Face Mask  Additional Equipment:   Intra-op Plan:   Post-operative Plan:   Informed Consent: I have reviewed the patients History and Physical, chart, labs and discussed the procedure including the risks, benefits and alternatives for the proposed anesthesia with the patient or authorized representative who has indicated his/her understanding and acceptance.     Plan Discussed with:   Anesthesia Plan Comments:         Anesthesia Quick Evaluation

## 2016-08-19 NOTE — Evaluation (Addendum)
Physical Therapy Evaluation Patient Details Name: Rhonda Briggs MRN: [id number] DOB: 1950-08-31 Today's Date: 08/19/2016   History of Present Illness  Rhonda Briggs is a 66yo white female who comes to [facility] for elective Rt TKA after history of chronic, progressive limiting knee OA. At baseline pt is fully independnet in ADL, IADL, still drives, and accesses the community ad lib. 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 Pt lives with husband in a single story house, ramp to enter.   Clinical Impression  Pt admitted with above diagnosis. Pt currently with functional limitations due to the deficits listed below (see "PT Problem List"). Upon entry, the patient is received semirecumbent in bed, awake and agreeable to participate. Pt participates in bed level exercises requiring minA physical assist or less for completion with excellent pain control. Functional mobility assessment demonstrates mild-moderate weakness, however the pt able to perform her bed mobility and transfers at supervision level or less with moderate additional effort and assistive device. Amb is tolerated to 133ft at supervision level, WBAT, with pediatric RW without increased pain and gait speed at 0.34m/s. Rt knee AROM is 9-95 degrees whereas additional flexion range is not heavily pursued at this time given timeline: pt reports some loss of TKE ROM PTA. The patient is at high risk for falls as evidence by gait speed <1.43m/s, forward reach <5", and multiple LOB demonstrated throughout session. She is unable to access the community or house at her baseline level of function. She is left sitting up in chair with Rt ankle supported and educated on importance of maintaining knee in TKE. CPM is set up and left in bed, and RN is educated on use and parameters. Pt will benefit from skilled PT intervention to increase independence and safety with basic mobility in preparation for discharge to the venue listed below.        Follow Up Recommendations Home health PT    Equipment Recommendations  Other (comment) (pediatric RW)    Recommendations for Other Services       Precautions / Restrictions Precautions Precautions: Knee;Fall Precaution Booklet Issued: No Restrictions Weight Bearing Restrictions: Yes RLE Weight Bearing: Weight bearing as tolerated      Mobility  Bed Mobility Overal bed mobility: Modified Independent             General bed mobility comments: additional time and effort required   Transfers Overall transfer level: Needs assistance Equipment used:  (ped RW ) Transfers: Sit to/from Stand Sit to Stand: Supervision         General transfer comment: additional time and effort required; VC for safe practices  Ambulation/Gait Ambulation/Gait assistance: Supervision Ambulation Distance (Feet): 140 Feet Assistive device:  (Ped RW ) Gait Pattern/deviations: Step-through pattern Gait velocity: 0.93m/s    General Gait Details: slight trunk flexion, good tolerance to [employer]            Wheelchair Mobility    Modified Rankin (Stroke Patients Only)       Balance Overall balance assessment: Modified Independent                                           Pertinent Vitals/Pain Pain Assessment: 0-10 Pain Score: 7  Pain Location: Right knee, during amb/therex  Pain Descriptors / Indicators: Aching;Operative site guarding Pain Intervention(s): Limited activity within patient's tolerance;Monitored during session;Premedicated before  session;Repositioned;Ice applied    Home Living Family/patient expects to be discharged to:: Private residence Living Arrangements: Spouse/significant other Available Help at Discharge: Family Type of Home: House Home Access: [place]: [place]: Walker - standard;Shower seat;Cane - single point      Prior Function Level of Independence: Independent          Comments: recently unable to tolerate community distances d/t knee OA      Hand Dominance   Dominant Hand: Right    Extremity/Trunk Assessment        Lower Extremity Assessment Lower Extremity Assessment: RLE deficits/detail;Overall [facility] for tasks assessed RLE Deficits / Details: 3/5 or greater, no resistance provided at this time.     Cervical / Trunk Assessment Cervical / Trunk Assessment: Normal  Communication   Communication: No difficulties  Cognition Arousal/Alertness: Awake/alert Behavior During Therapy: WFL for tasks assessed/performed Overall Cognitive Status: Within Functional Limits for tasks assessed                                        General Comments      Exercises Total Joint Exercises Ankle Circles/Pumps: Both;Supine;20 reps Quad Sets: Right;15 reps;Supine;Strengthening Short Arc QuadSinclair Ship;Right;10 reps;Supine Heel Slides: AROM;Right;Supine;15 reps Hip ABduction/ADduction: AROM;Right;15 reps;Supine Straight Leg Raises: AAROM;Right;10 reps;Supine Goniometric ROM: 9-95 degrees Rt knee flexion (no flexion ROM agressively pursued at this time, pt is at goal for range/timeline.    Assessment/Plan    PT Assessment Patient needs continued PT services  PT Problem List Decreased strength;Decreased range of motion;Decreased activity tolerance;Decreased balance;Decreased mobility;Decreased knowledge of precautions;Decreased knowledge of use of DME       PT Treatment Interventions DME instruction;Gait training;Stair training;Functional mobility training;Therapeutic exercise;Balance training;Therapeutic activities;Patient/family education    PT Goals (Current goals can be found in the Care Plan section)  Acute Rehab PT Goals Patient Stated Goal: regain strength and confidence in HHPT  PT Goal Formulation: With patient Time For Goal Achievement: 09/02/16 Potential to Achieve Goals: Good    Frequency BID   Barriers to discharge         Co-evaluation               AM-PAC PT "6 Clicks" Daily Activity  Outcome Measure Difficulty turning over in bed (including adjusting bedclothes, sheets and blankets)?: A Lot Difficulty moving from lying on back to sitting on the side of the bed? : A Lot Difficulty sitting down on and standing up from a chair with arms (e.g., wheelchair, bedside commode, etc,.)?: A Lot Help needed moving to and from a bed to chair (including a wheelchair)?: A Lot Help needed walking in hospital room?: A Little Help needed climbing 3-5 steps with a railing? : A Lot 6 Click Score: 13    End of Session Equipment Utilized During Treatment: Gait belt Activity Tolerance: Patient tolerated treatment well;No increased pain Patient left: in chair;in CPM;with call bell/phone within reach   PT Visit Diagnosis: Difficulty in walking, not elsewhere classified (R26.2);Other abnormalities of gait and mobility (R26.89)    Time: 9629-5284 PT Time Calculation (min) (ACUTE ONLY): 48 min   Charges:   PT Evaluation $PT Eval Low Complexity: 1 Procedure PT Treatments $Gait Training: 8-22 mins $Therapeutic Exercise: 8-22 mins   PT G Codes:        3:46 PM, 2016/09/18 Etta Grandchild, PT, DPT Physical Therapist - [facility] [phone] (Office)  Onesha Krebbs C 08/19/2016, 3:39 PM

## 2016-08-19 NOTE — Op Note (Signed)
08/19/2016  9:34 AM  PATIENT:  Rhonda Briggs  66 y.o. female  PRE-OPERATIVE DIAGNOSIS:  RIGHT KNEE OSTEOARTHRITIS  POST-OPERATIVE DIAGNOSIS:  RIGHT KNEE OSTEOARTHRITIS  Surgical findings all 3 compartments were involved medial condyle of the femur lateral condyle of the femur grade 4 wear. She had a grade 4 wear of the patella. Her patella was 18 mm in thickness and I could not replace it. This was not replaced secondary to high risk of fracture with patellar resections leaving less than 12 mm of bone.  Severe synovitis throughout the joint  Valgus deformity  Implants Sigma fixed bearing posterior stabilized Depuy total knee size 2.5 femur, size 2 tibia, size 10 polyethylene PS insert.  Assisted by Simonne Maffucci  Spinal anesthesia  Counts were correct  PROCEDURE:  Procedure(s): TOTAL KNEE ARTHROPLASTY (Right)  SURGEON:  Surgeon(s) and Role:    Carole Civil, MD - Primary   EBL:  Total I/O In: 1500 [I.V.:1500] Out: 105 [Urine:100; Blood:5]  One Hemovac drain  30 mL of Marcaine with epinephrine injected  exparel 20 mL diluted with 40 mL saline  No specimens  Tourniquet time 71 minutes 300 mmHg  TOURNIQUET:  * Missing tourniquet times found for documented tourniquets in log:  [id number] *  DICTATION: .Rhonda Briggs Dictation  PLAN OF CARE: Admit to inpatient   PATIENT DISPOSITION:  PACU - hemodynamically stable.   Delay start of Pharmacological VTE agent (>24hrs) due to surgical blood loss or risk of bleeding: yes  [phone]

## 2016-08-19 NOTE — Anesthesia Postprocedure Evaluation (Signed)
Anesthesia Post Note  Patient: Rhonda Briggs  Procedure(s) Performed: Procedure(s) (LRB): TOTAL KNEE ARTHROPLASTY (Right)  Patient location during evaluation: PACU Anesthesia Type: Spinal Level of consciousness: awake and alert, oriented and patient cooperative Pain management: pain level controlled Vital Signs Assessment: post-procedure vital signs reviewed and stable Respiratory status: spontaneous breathing Cardiovascular status: stable Postop Assessment: spinal receding Anesthetic complications: no     Last Vitals:  Vitals:   08/19/16 0720 08/19/16 0940  BP: 117/69 (!) 105/47  Resp: 19 20  Temp:  36.4 C    Last Pain:  Vitals:   08/19/16 0940  TempSrc:   PainSc: 0-No pain                 ADAMS, AMY A

## 2016-08-19 NOTE — Op Note (Signed)
08/19/2016  9:34 AM  PATIENT:  Rhonda Briggs  66 y.o. female  PRE-OPERATIVE DIAGNOSIS:  RIGHT KNEE OSTEOARTHRITIS  POST-OPERATIVE DIAGNOSIS:  RIGHT KNEE OSTEOARTHRITIS  Surgical findings all 3 compartments were involved medial condyle of the femur lateral condyle of the femur grade 4 wear. She had a grade 4 wear of the patella. Her patella was 18 mm in thickness and I could not replace it. This was not replaced secondary to high risk of fracture with patellar resections leaving less than 12 mm of bone.  Severe synovitis throughout the joint  Valgus deformity  Operative report for right total knee arthroplasty  The patient was identified in the preoperative holding area using 2 methods of identification. The operative extremity was evaluated and found to be acceptable for surgery. The chart was reviewed. The surgical site was marked.  The patient was taken to the operating room for spinal anesthesia. Antibiotic per SCIP protocol was used. (ancef 2gm). A Foley catheter was inserted. The RIGHT KNEE AND  lower extremity was then prepped and draped for surgery using standadr sterile technique.   The surgical team was gowned in [employer]"  The timeout was executed and the surgical site RIGHT  Knee confirmed. Implants were present. X-rays were reviewed and available.  The RIGHT lower extremity was exsanguinated with a six-inch Esmarch and the tourniquet was inflated to 300 mm of mercury. A standard midline incision was made. The incision was carried down to the extensor mechanism. A medial arthrotomy was performed and the patella was everted. The patellofemoral ligament was released.  The medial soft tissue sleeve was elevated to the mid coronal plane. The anterior cruciate ligament and PCL were resected along with the medial and lateral meniscus.  A 3/8  inch drill bit was used to open up the femoral canal which was decompressed with suction and  irrigation until the irrigation was clear.  The distal resection was set for 58mm 5 right   The distal resection guide was pinned in place and an oscillating saw was used to remove the distal 9 mm of bone.    The external alignment guide was secured to the tibia. The stylus was set for 2 mm resection from the medial lower side of the tibia. Appropriate slope and rotation alignment was confirmed. Landmarks include the medial mid tibial tubercle junction, lateral malleolus ., the second toe and tibial shaft. The proximal tibia was then resected. The tibia measured a size 2  Extension gap check was performed and the extension gap was too small so an additional 2 mm of femur was resected  This opened up the extension gap to except a 10 mm block with stable collateral ligaments  The femur sizing guide measured the femur to a size between 2 and 2.5 . A size 2.5  4-in-1 cutting block was pinned to the distal femur; with ligament retractors in place the 4 femoral cuts were made. Spacer blocks were placed and the knee balanced with a 10 mm mm spacer block.  The appropriate-sized femoral notch cutting guide was secured to the distal femur and the notch cut was completed.  Trial components were placed and range of motion was checked along with stability. The prosthesis was stable with the following components  Femur 2.5 femur Tibia 2 tibia Spacer 10 mm   With the trial components in place any residual bone or osteophytes from the posterior condyles was removed with a curved osteotome.  I then turned attention to the patella. It measured  18 mm. The thinnest patella button was an 8.5. Resecting a nap millimeters of bone would leave [place] with a 9.5 patella. This would be at too high risk for fracture. I did not 1 overstuff the patellofemoral joint so I did not replace the patella  The tibial tray was then attached to the proximal tibia and prepared with reaming and punching.  ExParel 20 cc was injected into the knee with specific attention paid to  the posterior capsule  The knee was then irrigated while the cement was prepared. The prosthesis was then cemented into place  Excess cement was removed. The cement was allowed to cure under pressurization  Repeat irrigation and any residual cement fragments were removed  The knee was again checked for range of motion, patellar tracking and stability with the trial spacer in place. A polyethylene insert was then placed using a size 10 mm PS  Irrigation was performed again. A drain was placed. 40 cc of dilute EXPAREL was then injected.  Closure was performed with #1 Bralon for the extensor mechanism. 30 cc of Marcaine with epinephrine was injected into the joint followed by 0 Monocryl for the subcutaneous tissue.  Skin was reapproximated with skin staples.   A sterile bandage was applied along with a TED hose.  The patient taken recovery in stable condition   Implants Sigma fixed bearing posterior stabilized Depuy total knee size 2.5 femur, size 2 tibia, size 10 polyethylene PS insert.  Assisted by Simonne Maffucci  Spinal anesthesia  Counts were correct  PROCEDURE:  Procedure(s): TOTAL KNEE ARTHROPLASTY (Right)  SURGEON:  Surgeon(s) and Role:    Carole Civil, MD - Primary   EBL:  Total I/O In: 1500 [I.V.:1500] Out: 105 [Urine:100; Blood:5]  One Hemovac drain  30 mL of Marcaine with epinephrine injected  exparel 20 mL diluted with 40 mL saline  No specimens  Tourniquet time 71 minutes 300 mmHg  TOURNIQUET:  * Missing tourniquet times found for documented tourniquets in log:  [id number] *  DICTATION: .Viviann Spare Dictation  PLAN OF CARE: Admit to inpatient   PATIENT DISPOSITION:  PACU - hemodynamically stable.   Delay start of Pharmacological VTE agent (>24hrs) due to surgical blood loss or risk of bleeding: yes  [phone]

## 2016-08-19 NOTE — Brief Op Note (Signed)
08/19/2016  9:34 AM  PATIENT:  Rhonda Briggs  66 y.o. female  PRE-OPERATIVE DIAGNOSIS:  RIGHT KNEE OSTEOARTHRITIS  POST-OPERATIVE DIAGNOSIS:  RIGHT KNEE OSTEOARTHRITIS  Surgical findings all 3 compartments were involved medial condyle of the femur lateral condyle of the femur grade 4 wear. She had a grade 4 wear of the patella. Her patella was 18 mm in thickness and I could not replace it. This was not replaced secondary to high risk of fracture with patellar resections leaving less than 12 mm of bone.  Severe synovitis throughout the joint  Valgus deformity  Implants Sigma fixed bearing posterior stabilized Depuy total knee size 2.5 femur, size 2 tibia, size 10 polyethylene PS insert.  Assisted by Simonne Maffucci  Spinal anesthesia  Counts were correct  PROCEDURE:  Procedure(s): TOTAL KNEE ARTHROPLASTY (Right)  SURGEON:  Surgeon(s) and Role:    Carole Civil, MD - Primary   EBL:  Total I/O In: 1500 [I.V.:1500] Out: 105 [Urine:100; Blood:5]  One Hemovac drain  30 mL of Marcaine with epinephrine injected  exparel 20 mL diluted with 40 mL saline  No specimens  Tourniquet time 71 minutes 300 mmHg  TOURNIQUET:  * Missing tourniquet times found for documented tourniquets in log:  [id number] *  DICTATION: .Viviann Spare Dictation  PLAN OF CARE: Admit to inpatient   PATIENT DISPOSITION:  PACU - hemodynamically stable.   Delay start of Pharmacological VTE agent (>24hrs) due to surgical blood loss or risk of bleeding: yes  [phone]

## 2016-08-20 LAB — BASIC METABOLIC PANEL
ANION GAP: 7 (ref 5–15)
BUN: 15 mg/dL (ref 6–20)
CALCIUM: 8.1 mg/dL — AB (ref 8.9–10.3)
CO2: 27 mmol/L (ref 22–32)
Chloride: 100 mmol/L — ABNORMAL LOW (ref 101–111)
Creatinine, Ser: 0.95 mg/dL (ref 0.44–1.00)
GFR calc Af Amer: >60 mL/min (ref >60)
GLUCOSE: 97 mg/dL (ref 65–99)
Potassium: 3.9 mmol/L (ref 3.5–5.1)
SODIUM: 134 mmol/L — AB (ref 135–145)

## 2016-08-20 LAB — CBC
HCT: 27.8 % — ABNORMAL LOW (ref 36.0–46.0)
Hemoglobin: 8.7 g/dL — ABNORMAL LOW (ref 12.0–15.0)
MCH: 22.6 pg — AB (ref 26.0–34.0)
MCHC: 31.3 g/dL (ref 30.0–36.0)
MCV: 72.2 fL — AB (ref 78.0–100.0)
PLATELETS: 475 10*3/uL — AB (ref 150–400)
RBC: 3.85 MIL/uL — AB (ref 3.87–5.11)
RDW: 16.7 % — AB (ref 11.5–15.5)
WBC: 9.9 10*3/uL (ref 4.0–10.5)

## 2016-08-20 MED ORDER — ONDANSETRON HCL 4 MG/2ML IJ SOLN
4.0000 mg | Freq: Four times a day (QID) | INTRAMUSCULAR | Status: DC
  Administered 2016-08-21: 4 mg via INTRAVENOUS
  Filled 2016-08-20: qty 2

## 2016-08-20 MED ORDER — ONDANSETRON HCL 4 MG PO TABS
4.0000 mg | ORAL_TABLET | Freq: Four times a day (QID) | ORAL | Status: DC
  Administered 2016-08-20 – 2016-08-21 (×4): 4 mg via ORAL
  Filled 2016-08-20 (×4): qty 1

## 2016-08-20 NOTE — Progress Notes (Signed)
Physical Therapy Treatment Patient Details Name: Rhonda Briggs MRN: [id number] DOB: 1950/12/07 Today's Date: 08/20/2016    History of Present Illness Rhonda Briggs is a 66yo white female who comes to [facility] for elective Rt TKA after history of chronic, progressive limiting knee OA. At baseline pt is fully independnet in ADL, IADL, still drives, and accesses the community ad lib. 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 Pt lives with husband in a single story house, ramp to enter. Pt was experiencing some PONV after session yesterday, now seems to be resolved. POD1 H/H: 8.7/27.8. Pt moved to CTM for O2 desat, back on 2L end of day POD0.     PT Comments    Pt demonstrating increased difficulty this session compared to 1DA, but still performed extremely well, showing progression of gait (independently motivated) covering 140ft this session, but with greater antalgic movement pattern and decreased gait speed, 0.27m/s today (0.38m/s on 7/25). Pt demonstrates higher degree of independence with HEP performance. ROM remains ahead of goal, 12-89 degrees Rt Knee flexion, however pain is more limiting this session. Pt is able to describe well her knee precautions. Pt demonstrating mild tachycardia with EOB, AMB, and supine exercises, desaturation to 86% p amb on room air, but is at 91% EOB on room air (RN made aware). Pt remains appropriate for DC to home at this time, high degree of confidence and independence thus far. PT will continue to monitor H/H as they are approaching levels than could potentially impact activity tolerance in future sessions.    Follow Up Recommendations  Home health PT     Equipment Recommendations  Other (comment) (pediatric RW )    Recommendations for Other Services       Precautions / Restrictions Precautions Precautions: Knee;Fall Precaution Booklet Issued: No Precaution Comments: educated patient at length, visits 1 and 2  Restrictions Weight  Bearing Restrictions: Yes RLE Weight Bearing: Weight bearing as tolerated    Mobility  Bed Mobility Overal bed mobility: Modified Independent             General bed mobility comments: performed with less effort compared to 1DA   Transfers Overall transfer level: Modified independent Equipment used:  (pediatric RW ) Transfers: Sit to/from Stand Sit to Stand: Modified independent (Device/Increase time)            Ambulation/Gait Ambulation/Gait assistance: Min guard Ambulation Distance (Feet): 170 Feet Assistive device:  (pediatric RW ) Gait Pattern/deviations: Step-through pattern Gait velocity: 0.71m/s today (0.74m/s on 7/25)  Gait velocity interpretation: <1.8 ft/sec, indicative of risk for recurrent falls General Gait Details: increased trunk flexion/antalgia compared to 1DA    Stairs            Wheelchair Mobility    Modified Rankin (Stroke Patients Only)       Balance Overall balance assessment: Modified Independent;No apparent balance deficits (not formally assessed)                                          Cognition Arousal/Alertness: Awake/alert Behavior During Therapy: WFL for tasks assessed/performed Overall Cognitive Status: Within Functional Limits for tasks assessed                                        Exercises Total Joint  Exercises Ankle Circles/Pumps: Both;Supine;20 reps Quad Sets: Right;15 reps;Supine;Strengthening Short Arc QuadSinclair Briggs;Right;Supine;15 reps Heel Slides: AROM;Right;Supine;15 reps Hip ABduction/ADduction: AROM;Right;15 reps;Supine Straight Leg Raises: Right;Supine;AROM;15 reps Goniometric ROM: 12-89 degrees Rt Knee flexion (increased pain since 1DA)  Marching in Standing: Supine;AAROM;Right;10 reps Bridges: AROM;Both;10 reps;Supine Other Exercises Other Exercises: HEP sheet provided and reviewed while performing in bed; pt educated on self assist with bed sheet to perform with  modified independence at DC.     General Comments        Pertinent Vitals/Pain Pain Assessment: 0-10 Pain Score: 9  Pain Location: Right knee, during amb/therex  Pain Descriptors / Indicators: Aching;Operative site guarding Pain Intervention(s): Limited activity within patient's tolerance;Monitored during session;Premedicated before session;Repositioned;Patient requesting pain meds-RN notified;Ice applied    Home Living                      Prior Function            PT Goals (current goals can now be found in the care plan section) Acute Rehab PT Goals Patient Stated Goal: regain strength and confidence in HHPT  PT Goal Formulation: With patient Time For Goal Achievement: 09/02/16 Potential to Achieve Goals: Good Progress towards PT goals: Progressing toward goals    Frequency    BID      PT Plan Current plan remains appropriate    Co-evaluation              AM-PAC PT "6 Clicks" Daily Activity  Outcome Measure  Difficulty turning over in bed (including adjusting bedclothes, sheets and blankets)?: A Little Difficulty moving from lying on back to sitting on the side of the bed? : A Little Difficulty sitting down on and standing up from a chair with arms (e.g., wheelchair, bedside commode, etc,.)?: A Little Help needed moving to and from a bed to chair (including a wheelchair)?: A Little Help needed walking in hospital room?: A Lot Help needed climbing 3-5 steps with a railing? : A Lot 6 Click Score: 16    End of Session Equipment Utilized During Treatment: Gait belt Activity Tolerance: Patient tolerated treatment well;Patient limited by pain Patient left: in chair;with call bell/phone within reach Nurse Communication: Mobility status (Tele battery is dead ) PT Visit Diagnosis: Difficulty in walking, not elsewhere classified (R26.2);Other abnormalities of gait and mobility (R26.89)     Time: 0762-2633 PT Time Calculation (min) (ACUTE ONLY): 39  min  Charges:  $Gait Training: 8-22 mins $Therapeutic Exercise: 8-22 mins $Therapeutic Activity: 8-22 mins                    G Codes:       11:07 AM, Sep 11, 2016 Etta Grandchild, PT, DPT Physical Therapist - [place] [phone] (Office)   Rhonda Briggs C 09/11/16, 11:01 AM

## 2016-08-20 NOTE — Addendum Note (Signed)
Addendum  created 08/20/16 1449 by Ollen Bowl, CRNA   Sign clinical note

## 2016-08-20 NOTE — Progress Notes (Signed)
Patient ID: Rhonda Briggs, female   DOB: 1950-09-01, 66 y.o.   MRN: [id number] POD 1 RT TKA  BP 125/63 (BP Location: Right Arm)   Pulse 88   Temp 98.8 F (37.1 C) (Oral)   Resp 16   Ht 5\' 2"  (1.575 m)   Wt 144 lb (65.3 kg)   SpO2 98%   BMI 26.34 kg/m   SHE IS ALERT AND AWAKE WITH GOOD PAIN CONTROL, HAD SOME NAUSEA LAST NIGHT WITH VOMITING   DRAIN REMOVED   NORMAL SENSATION IN ALL EXTREMITIES  NORMAL COLOR AND PERFUSION OF ALL EXTREMS  CBC Latest Ref Rng & Units 08/20/2016 08/14/2016 07/16/2016  WBC 4.0 - 10.5 K/uL 9.9 11.2(H) 12.5(H)  Hemoglobin 12.0 - 15.0 g/dL 8.7(L) 10.5(L) 10.2(L)  Hematocrit 36.0 - 46.0 % 27.8(L) 34.0(L) 33.2(L)  Platelets 150 - 400 K/uL 475(H) 707(H) 765(H)   BMP Latest Ref Rng & Units 08/20/2016 08/14/2016 06/12/2016  Glucose 65 - 99 mg/dL 97 114(H) 95  BUN 6 - 20 mg/dL 15 23(H) 17  Creatinine 0.44 - 1.00 mg/dL 0.95 1.09(H) 1.10(H)  Sodium 135 - 145 mmol/L 134(L) 133(L) 134(L)  Potassium 3.5 - 5.1 mmol/L 3.9 4.5 3.9  Chloride 101 - 111 mmol/L 100(L) 96(L) 97(L)  CO2 22 - 32 mmol/L 27 27 27   Calcium 8.9 - 10.3 mg/dL 8.1(L) 9.3 9.2    S/P RT TKA AMBULATED YESTERDAY  ADD MEDS FOR NAUSEA STOP IVFS   SHE IS ON IRON MONITOR FOR DIZZYNESS

## 2016-08-20 NOTE — Addendum Note (Signed)
Addendum  created 08/20/16 1444 by Ollen Bowl, CRNA   Sign clinical note

## 2016-08-20 NOTE — Anesthesia Postprocedure Evaluation (Signed)
Anesthesia Post Note  Patient: Rhonda Briggs  Procedure(s) Performed: Procedure(s) (LRB): TOTAL KNEE ARTHROPLASTY (Right)  Patient location during evaluation: Nursing Unit Anesthesia Type: Spinal Level of consciousness: awake and alert and oriented Pain management: pain level controlled Vital Signs Assessment: post-procedure vital signs reviewed and stable Respiratory status: spontaneous breathing Cardiovascular status: stable Postop Assessment: no signs of nausea or vomiting Anesthetic complications: no     Last Vitals:  Vitals:   08/20/16 0930 08/20/16 0935  BP:    Pulse: (!) 109 (!) 105  Resp:    Temp:      Last Pain:  Vitals:   08/20/16 1243  TempSrc:   PainSc: 6                  Nimra Puccinelli

## 2016-08-20 NOTE — Progress Notes (Addendum)
Physical Therapy Treatment Patient Details Name: Rhonda Briggs MRN: [id number] DOB: 11/03/1950 Today's Date: 08/20/2016    History of Present Illness Rhonda Briggs is a 66yo white female who comes to [facility] for elective Rt TKA after history of chronic, progressive limiting knee OA. At baseline pt is fully independnet in ADL, IADL, still drives, and accesses the community ad lib. 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 Pt lives with husband in a single story house, ramp to enter. Pt was experiencing some PONV after session yesterday, now seems to be resolved. POD1 H/H: 8.7/27.8. Pt moved to CTM for O2 desat, back on 2L end of day POD0.     PT Comments    PT returning to room for BID session. Pt has been up to chair for >2 hours since completion of AM session. Emphasis on this session is CPM education with RN and patient. Pt transferring with minA lift assist from lower surfaces x2 and allowed to perform toiletting/cleanup independently. Education on safe technique for EOB to supine. CPM is set up on RLE with pt tolerating well the range from -8-85 degrees while she begins her lunch meal in bed. Pt progressing well toward goals. No nausea, pain managed well, saturated WNL on 1LPM. Coordinated CPM schedule with RN.    Follow Up Recommendations  Home health PT     Equipment Recommendations   (peditatric RW in room this session, delivered from [facility])    Recommendations for Other Services       Precautions / Restrictions Precautions Precautions: Knee;Fall Precaution Booklet Issued: No Precaution Comments: educated patient at length, visits 1 and 2  Restrictions Weight Bearing Restrictions: Yes RLE Weight Bearing: Weight bearing as tolerated    Mobility  Bed Mobility Overal bed mobility: Needs Assistance Bed Mobility: Sit to Supine       Sit to supine: Supervision   General bed mobility comments: educated on foot hook for return to bed. Pt moves explosively  back in swinging both legs simultaneously and leaning back onto bilat arms.   Transfers Overall transfer level: Needs assistance Equipment used:  (pediatric RW ) Transfers: Sit to/from Stand Sit to Stand: Min guard         General transfer comment: performed 2x; additional assistance needed d/t low height of toilet.   Ambulation/Gait Ambulation/Gait assistance: Min guard Ambulation Distance (Feet):  (no AMB this session; to bathroom only. ) Assistive device:  (pediatric RW )     Stairs            Wheelchair Mobility    Modified Rankin (Stroke Patients Only)       Balance Overall balance assessment: Modified Independent;No apparent balance deficits (not formally assessed)                                          Cognition Arousal/Alertness: Awake/alert Behavior During Therapy: WFL for tasks assessed/performed Overall Cognitive Status: Within Functional Limits for tasks assessed                                        Exercises     General Comments        Pertinent Vitals/Pain Pain Assessment: 0-10 Pain Score: 8  Pain Location: Right knee, during amb/therex  Pain Descriptors / Indicators:  Aching;Operative site guarding Pain Intervention(s): Limited activity within patient's tolerance;Monitored during session;Premedicated before session;Repositioned    Home Living                      Prior Function            PT Goals (current goals can now be found in the care plan section) Acute Rehab PT Goals Patient Stated Goal: regain strength and confidence in HHPT  PT Goal Formulation: With patient Time For Goal Achievement: 09/02/16 Potential to Achieve Goals: Good Progress towards PT goals: Progressing toward goals    Frequency    BID      PT Plan Current plan remains appropriate    Co-evaluation              AM-PAC PT "6 Clicks" Daily Activity  Outcome Measure  Difficulty turning over in  bed (including adjusting bedclothes, sheets and blankets)?: A Little Difficulty moving from lying on back to sitting on the side of the bed? : A Lot Difficulty sitting down on and standing up from a chair with arms (e.g., wheelchair, bedside commode, etc,.)?: A Little Help needed moving to and from a bed to chair (including a wheelchair)?: A Little Help needed walking in hospital room?: A Lot Help needed climbing 3-5 steps with a railing? : A Lot 6 Click Score: 15    End of Session Equipment Utilized During Treatment: Gait belt Activity Tolerance: Patient tolerated treatment well;Patient limited by pain Patient left: with call bell/phone within reach;in bed;in CPM;with family/visitor present Nurse Communication:  (educated RN and patient on CPM use ) PT Visit Diagnosis: Difficulty in walking, not elsewhere classified (R26.2);Other abnormalities of gait and mobility (R26.89)     Time: 1205-1227 PT Time Calculation (min) (ACUTE ONLY): 22 min  Charges:  $Gait Training: 8-22 mins $Therapeutic Exercise: 8-22 mins $Therapeutic Activity: 8-22 mins                    G Codes:       1:16 PM, 09/16/16 Etta Grandchild, PT, DPT Physical Therapist - [place] [phone] (Office)    Jamont Mellin C 09-16-2016, 1:11 PM

## 2016-08-20 NOTE — Anesthesia Postprocedure Evaluation (Signed)
Anesthesia Post Note  Patient: Greyson Riccardi  Procedure(s) Performed: Procedure(s) (LRB): TOTAL KNEE ARTHROPLASTY (Right)  Patient location during evaluation: Nursing Unit Anesthesia Type: Spinal Level of consciousness: awake and alert and oriented Pain management: pain level controlled Vital Signs Assessment: post-procedure vital signs reviewed and stable Respiratory status: spontaneous breathing Cardiovascular status: blood pressure returned to baseline Postop Assessment: no signs of nausea or vomiting Anesthetic complications: no     Last Vitals:  Vitals:   08/20/16 0930 08/20/16 0935  BP:    Pulse: (!) 109 (!) 105  Resp:    Temp:      Last Pain:  Vitals:   08/20/16 1243  TempSrc:   PainSc: 6                  Kelani Robart

## 2016-08-21 ENCOUNTER — Encounter (HOSPITAL_COMMUNITY): Payer: Self-pay | Admitting: Orthopedic Surgery

## 2016-08-21 LAB — CBC
HCT: 27.8 % — ABNORMAL LOW (ref 36.0–46.0)
HEMOGLOBIN: 8.8 g/dL — AB (ref 12.0–15.0)
MCH: 22.3 pg — AB (ref 26.0–34.0)
MCHC: 31.7 g/dL (ref 30.0–36.0)
MCV: 70.6 fL — ABNORMAL LOW (ref 78.0–100.0)
Platelets: 518 10*3/uL — ABNORMAL HIGH (ref 150–400)
RBC: 3.94 MIL/uL (ref 3.87–5.11)
RDW: 16.5 % — ABNORMAL HIGH (ref 11.5–15.5)
WBC: 13.5 10*3/uL — AB (ref 4.0–10.5)

## 2016-08-21 MED ORDER — GABAPENTIN 100 MG PO CAPS: 200.0000 mg | ORAL_CAPSULE | Freq: Three times a day (TID) | ORAL | 0 refills | Status: DC

## 2016-08-21 MED ORDER — OXYCODONE-ACETAMINOPHEN 5-325 MG PO TABS: 1.0000 | ORAL_TABLET | ORAL | Status: DC | PRN

## 2016-08-21 MED ORDER — OXYCODONE-ACETAMINOPHEN 5-325 MG PO TABS: 1.0000 | ORAL_TABLET | ORAL | 0 refills | Status: DC | PRN

## 2016-08-21 MED ORDER — BISACODYL 5 MG PO TBEC: 5.0000 mg | DELAYED_RELEASE_TABLET | Freq: Every day | ORAL | 0 refills | Status: DC | PRN

## 2016-08-21 MED ORDER — ASPIRIN 325 MG PO TBEC: 325.0000 mg | DELAYED_RELEASE_TABLET | Freq: Every day | ORAL | 0 refills | Status: DC

## 2016-08-21 MED ORDER — METHOCARBAMOL 500 MG PO TABS: 500.0000 mg | ORAL_TABLET | Freq: Four times a day (QID) | ORAL | 2 refills | Status: DC | PRN

## 2016-08-21 NOTE — Discharge Summary (Signed)
Physician Discharge Summary  Patient ID: Rhonda Briggs MRN: [id number] DOB/AGE: May 13, 1950 66 y.o.  Admit date: 08/19/2016 Discharge date: 08/21/2016  Admission Diagnoses: Osteoarthritis right knee  Discharge Diagnoses: Osteoarthritis right knee Active Problems:   S/P TKR (total knee replacement), right   Primary osteoarthritis of right knee   Primary osteoarthritis   Discharged Condition: good  Hospital Course:  Patient underwent total knee replacement on the right on 08/19/2016. Should uncomplicated right total knee with a [place] posterior stabilized Sigma fixed bearing total knee replacement  She tolerated it well progressed well in physical therapy gaining 90 of flexion by my own PT session prior to discharge. Her wound was clean dry and intact she was neurovascularly intact with no signs of DVT  Social services note Subjective/Objective: s/p TKR - right. Recommended for [facility] PT. Patient would like [facility] health for [facility] and [facility] for DME needs. [facility] does not have available PT until August 6th. Patient will be referred to [facility]. [name] of [facility] is aware of referral.                  Action/Plan: Anticipate Return home with home health PT. Will need HH order/f75f. CM will send referral for DME to [facility].    Physical therapist note Subjective/Objective: s/p TKR - right. Recommended for [facility] PT. Patient would like [facility] health for [facility] and [facility] for DME needs. [facility] does not have available PT until August 6th. Patient will be referred to [facility]. [name] of [facility] is aware of referral.                  Action/Plan: Anticipate Return home with home health PT. Will need HH order/f46f. CM will send referral for DME to [facility].  PT returning to room for BID session. Pt has been up to chair for >2 hours since completion of AM session. Emphasis on this session is CPM education with RN and patient. Pt transferring with minA lift assist  from lower surfaces x2 and allowed to perform toiletting/cleanup independently. Education on safe technique for EOB to supine. CPM is set up on RLE with pt tolerating well the range from -8-85 degrees while she begins her lunch meal in bed. Pt progressing well toward goals. No nausea, pain managed well, saturated WNL on 1LPM. Coordinated CPM schedule with RN.  PT returning to room for BID session. Pt has been up to chair for >2 hours since completion of AM session. Emphasis on this session is CPM education with RN and patient. Pt transferring with minA lift assist from lower surfaces x2 and allowed to perform toiletting/cleanup independently. Education on safe technique for EOB to supine. CPM is set up on RLE with pt tolerating well the range from -8-85 degrees while she begins her lunch meal in bed. Pt progressing well toward goals. No nausea, pain managed well, saturated WNL on 1LPM. Coordinated CPM schedule with RN.     Discharge Exam: Blood pressure (!) 139/57, pulse 98, temperature 97.8 F (36.6 C), temperature source Oral, resp. rate 20, height 5\' 2"  (1.575 m), weight 144 lb (65.3 kg), SpO2 100 %.   Disposition: 01-Home or Self Care  Discharge Instructions    Call MD / Call 911    Complete by:  As directed    If you experience chest pain or shortness of breath, CALL 911 and be transported to the hospital emergency room.  If you develope a fever above 101  F, pus (white drainage) or increased drainage or redness at the wound, or calf pain, call your surgeon's office.   Change dressing    Complete by:  As directed    DO NOT CHANGE DRESSING   Constipation Prevention    Complete by:  As directed    Drink plenty of fluids.  Prune juice may be helpful.  You may use a stool softener, such as Colace (over the counter) 100 mg twice a day.  Use MiraLax (over the counter) for constipation as needed.   Diet - low sodium heart healthy    Complete by:  As directed    Do not put a pillow under the  knee. Place it under the heel.    Complete by:  As directed    Increase activity slowly as tolerated    Complete by:  As directed    TED hose    Complete by:  As directed    Use stockings (TED hose) for 2 weeks on BOTH leg(s).  You may remove them at night for sleeping.     Allergies as of 08/21/2016   No Known Allergies     Medication List    STOP taking these medications   acetaminophen 500 MG tablet Commonly known as:  TYLENOL   HYDROcodone-acetaminophen 5-325 MG tablet Commonly known as:  NORCO/VICODIN   ibuprofen 200 MG tablet Commonly known as:  ADVIL,MOTRIN     TAKE these medications   aspirin 325 MG EC tablet Take 1 tablet (325 mg total) by mouth daily with breakfast.   bisacodyl 5 MG EC tablet Commonly known as:  DULCOLAX Take 1 tablet (5 mg total) by mouth daily as needed for moderate constipation.   celecoxib 200 MG capsule Commonly known as:  CELEBREX Take 200 mg by mouth daily. Take with food.   ferrous sulfate 325 (65 FE) MG tablet Commonly known as:  FEOSOL Take 1 tablet (325 mg total) by mouth 3 (three) times daily with meals. What changed:  when to take this   gabapentin 100 MG capsule Commonly known as:  NEURONTIN Take 2 capsules (200 mg total) by mouth 3 (three) times daily.   hydrochlorothiazide 25 MG tablet Commonly known as:  HYDRODIURIL Take 25 mg by mouth daily.   methocarbamol 500 MG tablet Commonly known as:  ROBAXIN Take 1 tablet (500 mg total) by mouth every 6 (six) hours as needed for muscle spasms.   nicotine 14 mg/24hr patch Commonly known as:  NICODERM CQ Place 1 patch (14 mg total) onto the skin daily.   oxyCODONE-acetaminophen 5-325 MG tablet Commonly known as:  PERCOCET/ROXICET Take 1 tablet by mouth every 4 (four) hours as needed for severe pain.   pramipexole 0.125 MG tablet Commonly known as:  MIRAPEX Take 0.25 mg by mouth at bedtime. 2 tablets at bedtime            Durable Medical Equipment        Start      Ordered   08/21/16 0715  For home use only DME Continuous passive motion machine  Once    Comments:  0-85// 6 hrs per day x 2 weeks // increase 10 degrees per day   08/21/16 0714   08/19/16 1451  DME Walker rolling  Once    Comments:  Needs pediatric rolling walker  Question:  Patient needs a walker to treat with the following condition  Answer:  S/P TKR (total knee replacement), right   08/19/16 1450   08/19/16  1451  For home use only DME 3 n 1  Once     08/19/16 1450   08/19/16 1215  DME 3 n 1  Once     08/19/16 1214   08/19/16 1215  DME Bedside commode  Once    Question:  Patient needs a bedside commode to treat with the following condition  Answer:  S/P TKR (total knee replacement), right   08/19/16 1214     Follow-up Information    [facility] [place] Follow up.   Why:  home health PT Contact information: [place] [address] [phone]           Signed: Arther Abbott 08/21/2016, 1:26 PM

## 2016-08-21 NOTE — Progress Notes (Signed)
Physical Therapy Treatment Patient Details Name: Rhonda Briggs MRN: [id number] DOB: [birth year] Today's Date: 08/21/2016    History of Present Illness Rhonda Briggs is a 66yo white female who comes to [facility] for elective Rt TKA after history of chronic, progressive limiting knee OA. At baseline pt is fully independnet in ADL, IADL, still drives, and accesses the community ad lib. 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 Pt lives with husband in a single story house, ramp to enter. Pt was experiencing some PONV after session yesterday, now seems to be resolved. POD1 H/H: 8.7/27.8. Pt moved to CTM for O2 desat, back on 2L end of day POD0.     PT Comments    Pt semirecumbent in bed, friendly and willing to participate with therapy today.  Reviewed HEP handout prior DC to home.  Pt able to demonstrate appropriate mechanics with minimal cueing required.  Gait training with RW with good mechanics, cueing to improve heel to toe pattern with knee flexion to improve mechanics. EOS seated with RN in room to review DC, ice applied to knee for pain and edema control.    Follow Up Recommendations        Equipment Recommendations       Recommendations for Other Services       Precautions / Restrictions Precautions Precautions: Knee;Fall Precaution Booklet Issued: No Precaution Comments: educated patient at length, visits 1 and 2  Restrictions Weight Bearing Restrictions: Yes RLE Weight Bearing: Weight bearing as tolerated    Mobility  Bed Mobility Overal bed mobility: Modified Independent Bed Mobility: Supine to Sit       Sit to supine: Supervision   General bed mobility comments: good mechanics, increased time due to knee stiffness, completed independently  Transfers Overall transfer level: Needs assistance Equipment used: Rolling walker (2 wheeled) (pediatric RW) Transfers: Sit to/from Stand Sit to Stand: Min guard         General transfer comment: good  mechanics noted, no cueing required   Ambulation/Gait   Ambulation Distance (Feet): 160 Feet Assistive device: Rolling walker (2 wheeled) (Pediatric RW) Gait Pattern/deviations: Step-through pattern     General Gait Details: Cueing to improve heel to toe mechanics and knee flexion to reduce antalgic mechancis with Abduction.   Stairs            Wheelchair Mobility    Modified Rankin (Stroke Patients Only)       Balance                                            Cognition Arousal/Alertness: Awake/alert Behavior During Therapy: WFL for tasks assessed/performed Overall Cognitive Status: Within Functional Limits for tasks assessed                                        Exercises Total Joint Exercises Ankle Circles/Pumps: Both;Supine;20 reps Quad Sets: Right;15 reps;Supine;Strengthening Short Arc QuadSinclair Ship;Right;Supine;15 reps Heel Slides: AROM;Right;Supine;15 reps Hip ABduction/ADduction: AROM;Right;15 reps;Supine Straight Leg Raises: Right;Supine;AROM;15 reps Long Arc Quad: Right;15 reps Goniometric ROM: 9-92 degrees Rt knee flexion Bridges: AROM;Both;10 reps;Supine    General Comments        Pertinent Vitals/Pain Pain Score: 2  Pain Location: Right knee, during amb/therex  Pain Descriptors / Indicators: Aching;Operative site guarding Pain  Intervention(s): Monitored during session;Repositioned;Ice applied    Home Living                      Prior Function            PT Goals (current goals can now be found in the care plan section)      Frequency    BID      PT Plan Current plan remains appropriate    Co-evaluation              AM-PAC PT "6 Clicks" Daily Activity  Outcome Measure  Difficulty turning over in bed (including adjusting bedclothes, sheets and blankets)?: A Little Difficulty moving from lying on back to sitting on the side of the bed? : A Lot Difficulty sitting down on and  standing up from a chair with arms (e.g., wheelchair, bedside commode, etc,.)?: A Little Help needed moving to and from a bed to chair (including a wheelchair)?: A Little Help needed walking in hospital room?: A Lot Help needed climbing 3-5 steps with a railing? : A Lot 6 Click Score: 15    End of Session Equipment Utilized During Treatment: Gait belt Activity Tolerance: Patient tolerated treatment well;No increased pain (Ice applied at EOS for edema and pain control) Patient left: in chair;with call bell/phone within reach;with nursing/sitter in room;with family/visitor present   PT Visit Diagnosis: Difficulty in walking, not elsewhere classified (R26.2);Other abnormalities of gait and mobility (R26.89)     Time: 9983-3825 PT Time Calculation (min) (ACUTE ONLY): 30 min  Charges:  $Gait Training: 8-22 mins $Therapeutic Exercise: 8-22 mins $Therapeutic Activity: 8-22 mins                    G Codes:      Ihor Austin, LPTA; CBIS [phone]'   Aldona Lento 08/21/2016, 11:06 AM

## 2016-08-21 NOTE — Care Management Important Message (Signed)
Important Message  Patient Details  Name: Rhonda Briggs MRN: [id number] Date of Birth: [birth year]   Medicare Important Message Given:  Yes    Sherrill Mckamie, Chauncey Reading, RN 08/21/2016, 9:40 AM

## 2016-08-21 NOTE — Progress Notes (Signed)
Pt's IV catheter removed and intact. Pt's IV site clean dry and intact. Discharge instructions including medications and follow up appointments were reviewed and discussed with patient. Pt verbalized understanding of discharge instructions. All questions were answered and no further questions at this time. Pt in stable condition and in no acute distress at this time. Pt escorted by nurse tech.

## 2016-08-21 NOTE — Care Management (Signed)
Patient discharging home today with HH PT. [name] of [facility] notified and will obtain orders from chart.   [facility] has CPM orders (sent by orthopedic office).   DME equipment has been delivered to patient's room.  No other CM needs.

## 2016-08-24 LAB — TYPE AND SCREEN
ABO/RH(D): A NEG
Antibody Screen: NEGATIVE
UNIT DIVISION: 0
Unit division: 0

## 2016-08-24 LAB — BPAM RBC
BLOOD PRODUCT EXPIRATION DATE: 201808082359
Blood Product Expiration Date: 201808082359
UNIT TYPE AND RH: 600
Unit Type and Rh: 600

## 2016-08-31 ENCOUNTER — Ambulatory Visit (INDEPENDENT_AMBULATORY_CARE_PROVIDER_SITE_OTHER): Payer: Medicare Other | Admitting: Orthopedic Surgery

## 2016-08-31 DIAGNOSIS — Z4889 Encounter for other specified surgical aftercare: Secondary | ICD-10-CM

## 2016-08-31 DIAGNOSIS — Z96651 Presence of right artificial knee joint: Secondary | ICD-10-CM

## 2016-08-31 MED ORDER — OXYCODONE-ACETAMINOPHEN 5-325 MG PO TABS: 1.0000 | ORAL_TABLET | ORAL | 0 refills | Status: DC | PRN

## 2016-08-31 NOTE — Progress Notes (Signed)
FOLLOW UP FOR  Right  TKA  Pod 12  The patient is doing very well. The pain is controlled with oxycodone   The surgical site is clean dry and intact  We were able to remove the staples today The calf is supple nontender the Homans sign is normal  DVT prevention aspirin 325 mg twice a day with bilateral TED hose  CPM machine at home  Home physical therapy  Meds ordered this encounter  Medications  . oxyCODONE-acetaminophen (PERCOCET/ROXICET) 5-325 MG tablet    Sig: Take 1 tablet by mouth every 4 (four) hours as needed for severe pain.    Dispense:  42 tablet    Refill:  0      Assessment and plan: Stable, continue physical therapy return 2-4 weeks Start physical therapy as outpatient when home therapy completed

## 2016-09-08 ENCOUNTER — Other Ambulatory Visit: Payer: Self-pay | Admitting: Orthopedic Surgery

## 2016-09-08 ENCOUNTER — Telehealth: Payer: Self-pay | Admitting: *Deleted

## 2016-09-08 MED ORDER — HYDROCODONE-ACETAMINOPHEN 7.5-325 MG PO TABS: 1.0000 | ORAL_TABLET | Freq: Three times a day (TID) | ORAL | 0 refills | Status: DC | PRN

## 2016-09-08 NOTE — Telephone Encounter (Signed)
Routing to Dr Harrison 

## 2016-09-08 NOTE — Telephone Encounter (Signed)
Patient called and is requesting a refill of her oxyCODONE-acetaminophen (PERCOCET/ROXICET) 5-325 MG tablet . Patient states you can call her on her cell if you have any questions . Her contact # is [phone]. Please advise. Thank you

## 2016-09-10 ENCOUNTER — Telehealth: Payer: Self-pay | Admitting: Orthopedic Surgery

## 2016-09-10 NOTE — Telephone Encounter (Signed)
Call received from physical therapist, Nicki Reaper, from [facility] (states he is doing the home therapy) requesting updated orders for home therapy for 2 times per week for 4 weeks, starting next week; states patient is doing very well. Please advise. Direct [phone]

## 2016-09-10 NOTE — Telephone Encounter (Signed)
Verbal order given  

## 2016-10-05 ENCOUNTER — Ambulatory Visit (INDEPENDENT_AMBULATORY_CARE_PROVIDER_SITE_OTHER): Payer: Medicare Other | Admitting: Orthopedic Surgery

## 2016-10-05 DIAGNOSIS — Z4889 Encounter for other specified surgical aftercare: Secondary | ICD-10-CM

## 2016-10-05 DIAGNOSIS — Z96651 Presence of right artificial knee joint: Secondary | ICD-10-CM

## 2016-10-05 MED ORDER — HYDROCODONE-ACETAMINOPHEN 7.5-325 MG PO TABS: 1.0000 | ORAL_TABLET | Freq: Three times a day (TID) | ORAL | 0 refills | Status: DC | PRN

## 2016-10-05 NOTE — Progress Notes (Signed)
Patient ID: Rhonda Briggs, female   DOB: [birth year], 66 y.o.   MRN: [id number]  POSTOP VISIT   Chief Complaint  Patient presents with  . Follow-up    08/19/2016 right total knee    66 year old female had a right total knee 6 weeks ago she complains of soreness in the front of her knee. Her left knee is giving way.  She's using a walker she can bend her knee 125 has a small flexion contracture less than 5  Her incision looks clean dry and intact  She complains of her bunion on the right foot causing her difficulty.     Meds ordered this encounter  Medications  . HYDROcodone-acetaminophen (NORCO) 7.5-325 MG tablet    Sig: Take 1 tablet by mouth every 8 (eight) hours as needed for moderate pain.    Dispense:  42 tablet    Refill:  0    We have recommended a toe spacer for the right foot we refilled her pain pill continue therapy follow-up in 6 weeks  Encounter Diagnoses  Name Primary?  . Status post right knee replacement   . Aftercare following surgery Yes      11:16 AM Arther Abbott, MD 10/05/2016

## 2016-11-16 ENCOUNTER — Ambulatory Visit (INDEPENDENT_AMBULATORY_CARE_PROVIDER_SITE_OTHER): Payer: Medicare Other

## 2016-11-16 ENCOUNTER — Encounter: Payer: Self-pay | Admitting: Orthopedic Surgery

## 2016-11-16 ENCOUNTER — Ambulatory Visit (INDEPENDENT_AMBULATORY_CARE_PROVIDER_SITE_OTHER): Payer: Medicare Other | Admitting: Orthopedic Surgery

## 2016-11-16 VITALS — BP 169/95 | HR 115 | Ht 61.0 in | Wt 134.0 lb

## 2016-11-16 DIAGNOSIS — Z96651 Presence of right artificial knee joint: Secondary | ICD-10-CM | POA: Diagnosis not present

## 2016-11-16 DIAGNOSIS — M25511 Pain in right shoulder: Secondary | ICD-10-CM

## 2016-11-16 DIAGNOSIS — G8929 Other chronic pain: Secondary | ICD-10-CM

## 2016-11-16 DIAGNOSIS — M1712 Unilateral primary osteoarthritis, left knee: Secondary | ICD-10-CM

## 2016-11-16 DIAGNOSIS — M545 Low back pain, unspecified: Secondary | ICD-10-CM

## 2016-11-16 MED ORDER — METHYLPREDNISOLONE ACETATE 40 MG/ML IJ SUSP
40.0000 mg | Freq: Once | INTRAMUSCULAR | Status: AC
  Administered 2016-11-16: 40 mg via INTRA_ARTICULAR

## 2016-11-16 MED ORDER — HYDROCODONE-ACETAMINOPHEN 5-325 MG PO TABS: 1.0000 | ORAL_TABLET | Freq: Four times a day (QID) | ORAL | 0 refills | Status: DC | PRN

## 2016-11-16 NOTE — Progress Notes (Signed)
Progress Note   Patient ID: Rhonda Briggs, female   DOB: 1950/04/26, 66 y.o.   MRN: [id number]  Chief Complaint  Patient presents with  . Routine Post Op    right 08/19/16 TKR  . Shoulder Pain    right    Postop day 89 from her right total knee [place] has several complaints today none of them regarding her right total knee  She is complaining of left knee pain with left giving way  She is complaining of severe pain right shoulder for several weeks. Dull aching constant right shoulder pain associated with painful flexion decreased range of motion and weakness in the right shoulder       Review of Systems  Constitutional: Negative for chills and fever.  Musculoskeletal: Positive for back pain.  Neurological: Negative for tingling and sensory change.   Current Meds  Medication Sig  . ferrous sulfate (FEOSOL) 325 (65 FE) MG tablet Take 1 tablet (325 mg total) by mouth 3 (three) times daily with meals. (Patient taking differently: Take 325 mg by mouth daily. )  . hydrochlorothiazide (HYDRODIURIL) 25 MG tablet Take 25 mg by mouth daily.   . pramipexole (MIRAPEX) 0.125 MG tablet Take 0.25 mg by mouth at bedtime. 2 tablets at bedtime    Past Medical History:  Diagnosis Date  . Anemia   . Arthritis   . Hypertension   . Osteoporosis   . PONV (postoperative nausea and vomiting)    after hernia surgery     No Known Allergies  BP (!) 169/95   Pulse (!) 115   Ht 5\' 1"  (1.549 m)   Wt 134 lb (60.8 kg)   BMI 25.32 kg/m    Physical Exam Gen. appearance the patient's appearance is normal with normal grooming and  hygiene The patient is oriented to person place and time Mood and affect are normal   Ortho Exam She has a limp but is favoring the left knee.  The left knee is in valgus. It is associated with range of motion deficit of 5 and flexion she can flex up to 125. She has pseudo-laxity in the coronal plane but ligaments are stable motor exam is normal skin is intact  there is no rash lesion or ulceration she has no sensory loss in the left leg in a good pulse  Her right knee is functioning well with a well-healed incision at an arc of 115 it stable in the sagittal and coronal plane with normal motor exam. Neurovascular exam is intact  Right shoulder is tender in the Peri-acromial region she has active flexion of 120 flexion 90 abduction 50 external rotation in her passive range of motion is the same with the exception of passive flexion of 150 with terminal range of motion pain  Apprehension sign was negative impingement sign of Neer was positive  Plain films of the shoulder were normal with a type II acromion which is more a spur than actual shape abnormality.  Glenohumeral joint was normal   Medical decision-making Encounter Diagnoses  Name Primary?  . 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 Chronic right shoulder pain   . Chronic midline low back pain without sciatica   . Primary osteoarthritis of left knee       Meds ordered this encounter  Medications  . methylPREDNISolone acetate (DEPO-MEDROL) injection 40 mg  . HYDROcodone-acetaminophen (NORCO) 5-325 MG tablet    Sig: Take 1 tablet by mouth every 6 (six) hours as needed for moderate  pain.    Dispense:  30 tablet    Refill:  0   Procedure note the subacromial injection shoulder left   Verbal consent was obtained to inject the  Left   Shoulder  Timeout was completed to confirm the injection site is a subacromial space of the  left  shoulder  Medication used Depo-Medrol 40 mg and lidocaine 1% 3 cc  Anesthesia was provided by ethyl chloride  The injection was performed in the left  posterior subacromial space. After pinning the skin with alcohol and anesthetized the skin with ethyl chloride the subacromial space was injected using a 20-gauge needle. There were no complications  Sterile dressing was applied.   Plan is to have her go for therapy for her back  which is my opinion causing her left leg to give way. She will need a left total knee replacement. We will do that after her spine gets addressed from a therapy standpoint  I injected her right shoulder for pain relief  Her right knee is functioning fine         Arther Abbott, MD 11/16/2016 12:25 PM

## 2016-11-16 NOTE — Patient Instructions (Signed)
Physical therapy has been ordered for you at [facility] [phone]  is the phone number to call if you want to call to schedule. Please let us know if you do not hear anything within one week.

## 2016-11-25 ENCOUNTER — Telehealth: Payer: Self-pay | Admitting: Orthopedic Surgery

## 2016-11-25 ENCOUNTER — Other Ambulatory Visit: Payer: Self-pay | Admitting: Orthopedic Surgery

## 2016-11-25 MED ORDER — HYDROCODONE-ACETAMINOPHEN 5-325 MG PO TABS: 1.0000 | ORAL_TABLET | Freq: Four times a day (QID) | ORAL | 0 refills | Status: DC | PRN

## 2016-11-25 NOTE — Telephone Encounter (Signed)
Patient called for refill:   HYDROcodone-acetaminophen (NORCO) 5-325 MG tablet 30 tablet

## 2016-12-16 ENCOUNTER — Encounter: Payer: Self-pay | Admitting: Orthopedic Surgery

## 2016-12-16 ENCOUNTER — Telehealth: Payer: Self-pay | Admitting: Radiology

## 2016-12-16 ENCOUNTER — Ambulatory Visit (INDEPENDENT_AMBULATORY_CARE_PROVIDER_SITE_OTHER): Payer: Medicare Other | Admitting: Orthopedic Surgery

## 2016-12-16 VITALS — BP 143/89 | HR 108 | Ht 61.0 in | Wt 135.0 lb

## 2016-12-16 DIAGNOSIS — M25511 Pain in right shoulder: Secondary | ICD-10-CM | POA: Diagnosis not present

## 2016-12-16 DIAGNOSIS — M1712 Unilateral primary osteoarthritis, left knee: Secondary | ICD-10-CM

## 2016-12-16 DIAGNOSIS — Z96651 Presence of right artificial knee joint: Secondary | ICD-10-CM | POA: Diagnosis not present

## 2016-12-16 DIAGNOSIS — G8929 Other chronic pain: Secondary | ICD-10-CM | POA: Diagnosis not present

## 2016-12-16 MED ORDER — CELECOXIB 200 MG PO CAPS: 200.0000 mg | ORAL_CAPSULE | Freq: Every day | ORAL | 5 refills | Status: DC

## 2016-12-16 MED ORDER — HYDROCODONE-ACETAMINOPHEN 5-325 MG PO TABS: 1.0000 | ORAL_TABLET | Freq: Four times a day (QID) | ORAL | 0 refills | Status: DC | PRN

## 2016-12-16 NOTE — Progress Notes (Signed)
Progress note office visit  Chief Complaint  Patient presents with  . Knee Pain    left  . Routine Post Op    right total knee replacement 08/19/16   Encounter Diagnoses  Name Primary?  . 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 Chronic right shoulder pain   . Primary osteoarthritis of left knee    History this is a 66 year old female had a successful right total knee arthroplasty in July 2018.  She comes in today complaining of persistent left knee pain which is dull aching and primarily over the lateral compartment with moderate to severe pain requiring opioid therapy.  She has had this for several years as well as for pain she had in her right knee prior to surgery.  Her pain is exercise and activity related is relieved by rest at times.  She complains of giving way with turning maneuvers  She had physical therapy on her back to alleviate some of her radiating back pain which was causing some of her knee discomfort  On review of systems she has persistent pain and deformity of her right foot with a great toe bunion and crossover toe deformity.  She has no frequency heart prior shortness of breath or chest pain she denies headache or blurred vision or unexpected weight loss  Her gait is somewhat unsteady at times secondary to knee pain and back discomfort she is not anxious or depressed no bleeding issues no excessive thirst or urination and no significant food allergies  Past Medical History:  Diagnosis Date  . Anemia   . Arthritis   . Hypertension   . Osteoporosis   . PONV (postoperative nausea and vomiting)    after hernia surgery   Past Surgical History:  Procedure Laterality Date  . CARPAL TUNNEL RELEASE Right 2011  . HERNIA REPAIR Left    inguinal  . TOTAL KNEE ARTHROPLASTY Right 08/19/2016   Procedure: TOTAL KNEE ARTHROPLASTY;  Surgeon: Carole Civil, MD;  Location: [facility];  Service: Orthopedics;  Laterality: Right;  . TUBAL LIGATION     Family  History  Problem Relation Age of Onset  . Lung disease Mother   . Cancer Father   . Urticaria Neg Hx   . Eczema Neg Hx   . Immunodeficiency Neg Hx   . Atopy Neg Hx   . Asthma Neg Hx   . Angioedema Neg Hx   . Allergic rhinitis Neg Hx     Current Outpatient Medications:  .  ferrous sulfate (FEOSOL) 325 (65 FE) MG tablet, Take 1 tablet (325 mg total) by mouth 3 (three) times daily with meals. (Patient taking differently: Take 325 mg by mouth daily. ), Disp: 90 tablet, Rfl: 0 .  hydrochlorothiazide (HYDRODIURIL) 25 MG tablet, Take 25 mg by mouth daily. , Disp: , Rfl:  .  HYDROcodone-acetaminophen (NORCO) 5-325 MG tablet, Take 1 tablet by mouth every 6 (six) hours as needed for moderate pain., Disp: 30 tablet, Rfl: 0 .  pramipexole (MIRAPEX) 0.125 MG tablet, Take 0.25 mg by mouth at bedtime. 2 tablets at bedtime, Disp: , Rfl:  .  aspirin EC 325 MG EC tablet, Take 1 tablet (325 mg total) by mouth daily with breakfast. (Patient not taking: Reported on 11/16/2016), Disp: 30 tablet, Rfl: 0 .  celecoxib (CELEBREX) 200 MG capsule, Take 1 capsule (200 mg total) by mouth daily. Take with food., Disp: 30 capsule, Rfl: 5 .  gabapentin (NEURONTIN) 100 MG capsule, Take 2 capsules (200 mg  total) by mouth 3 (three) times daily. (Patient not taking: Reported on 11/16/2016), Disp: 60 capsule, Rfl: 0 BP (!) 143/89   Pulse (!) 108   Ht 5\' 1"  (1.549 m)   Wt 135 lb (61.2 kg)   BMI 25.51 kg/m    Physical exam BP (!) 143/89   Pulse (!) 108   Ht 5\' 1"  (1.549 m)   Wt 135 lb (61.2 kg)   BMI 25.51 kg/m  she is awake alert and oriented x3 her mood and affect is normal her appearance is normal and she has no significant deformities and no evidence of obesity.  On inspection of the left knee we see a valgus knee with tenderness over the lateral compartment no effusion flexion is notable for slight flexion contracture but otherwise full range of motion ligaments are stable there is no pseudolaxity excellent skin is  warm dry and intact without rash or erythema distal pulses are normal without edema sensation is intact as well no pathologic reflexes are noted  Imaging.  X-ray from February 2018 shows a valgus knee with loss of joint space laterally subchondral sclerosis is seen and there is some osteopenia.  A subsequent x-ray of the patellofemoral joint shows a centralized patella with mild peripheral osteophytes  Encounter Diagnoses  Name Primary?  . 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 Chronic right shoulder pain   . Primary osteoarthritis of left knee    The patient would like to undergo left total knee arthroplasty  The procedure has been fully reviewed with the patient; The risks and benefits of surgery have been discussed and explained and understood. Alternative treatment has also been reviewed, questions were encouraged and answered. The postoperative plan is also been reviewed.   Surgery arranged for December 6

## 2016-12-16 NOTE — Telephone Encounter (Deleted)
[place] Pre op 12/3 @ 2:45, surgery 12/6 @ 7:30

## 2016-12-16 NOTE — Patient Instructions (Signed)
You have decided to proceed with knee replacement surgery. You have decided not to continue with nonoperative measures such as but not limited to oral medication, weight loss, activity modification, physical therapy, bracing, or injection.  We will perform the procedure commonly known as total knee replacement. Some of the risks associated with knee replacement surgery include but are not limited to Bleeding Infection Swelling Stiffness Blood clot Pain that persists even after surgery  Infection is especially devastating complication of knee surgery although rare. If infection does occur your implant will usually have to be removed and several surgeries and antibiotics will be needed to eradicate the infection prior to performing a repeat replacement.   In some cases amputation is required to eradicate the infection. In other rare cases a knee fusion is needed    If you're not comfortable with these risks and would like to continue with nonoperative treatment please let Dr. Harrison know prior to your surgery.  

## 2016-12-16 NOTE — Addendum Note (Signed)
Addended by: Arther Abbott E on: 12/16/2016 01:59 PM   Modules accepted: Orders, SmartSet

## 2016-12-16 NOTE — Telephone Encounter (Signed)
Called patient to advise preop and op times

## 2016-12-24 ENCOUNTER — Other Ambulatory Visit: Payer: Self-pay | Admitting: Orthopedic Surgery

## 2016-12-24 DIAGNOSIS — M1712 Unilateral primary osteoarthritis, left knee: Secondary | ICD-10-CM

## 2016-12-24 NOTE — Patient Instructions (Signed)
Rhonda Briggs  12/24/2016     @PREFPERIOPPHARMACY @   Your procedure is scheduled on  12/31/2016 .  Report to [facility] at  615   A.M.  Call this number if you have problems the morning of surgery:  [phone]   Remember:  Do not eat food or drink liquids after midnight.  Take these medicines the morning of surgery with A SIP OF WATER  Celebrex, hydrocodone.   Do not wear jewelry, make-up or nail polish.  Do not wear lotions, powders, or perfumes, or deoderant.  Do not shave 48 hours prior to surgery.  Men may shave face and neck.  Do not bring valuables to the hospital.  [facility] is not responsible for any belongings or valuables.  Contacts, dentures or bridgework may not be worn into surgery.  Leave your suitcase in the car.  After surgery it may be brought to your room.  For patients admitted to the hospital, discharge time will be determined by your treatment team.  Patients discharged the day of surgery will not be allowed to drive home.   Name and phone number of your driver:   family Special instructions:  None  Please read over the following fact sheets that you were given. Pain Booklet, Coughing and Deep Breathing, Blood Transfusion Information, Total Joint Packet, MRSA Information, Surgical Site Infection Prevention, Anesthesia Post-op Instructions and Care and Recovery After Surgery       Total Knee Replacement Total knee replacement is a procedure to replace the knee joint with an artificial (prosthetic) knee joint. The purpose of this surgery is to reduce knee pain and improve knee function. The prosthetic knee joint (prosthesis) is usually made of metal and plastic. It replaces parts of the thigh bone (femur), lower leg bone (tibia), and kneecap (patella) that are removed during the procedure. Tell a health care provider about:  Any allergies you have.  All medicines you are taking, including vitamins, herbs, eye drops, creams,  and over-the-counter medicines.  Any problems you or family members have had with anesthetic medicines.  Any blood disorders you have.  Any surgeries you have had.  Any medical conditions you have.  Whether you are pregnant or may be pregnant. What are the risks? Generally, this is a safe procedure. However, problems may occur, including:  Infection.  Bleeding.  Allergic reactions to medicines.  Damage to other structures or organs.  Decreased range of motion of the knee.  Instability of the knee.  Loosening of the prosthetic joint.  Knee pain that does not go away (chronic pain).  What happens before the procedure?  Ask your health care provider about: ? Changing or stopping your regular medicines. This is especially important if you are taking diabetes medicines or blood thinners. ? Taking medicines such as aspirin and ibuprofen. These medicines can thin your blood. Do not take these medicines before your procedure if your health care provider instructs you not to.  Have dental care and routine cleanings completed before your procedure. Plan to not have dental work done for 3 months after your procedure. Germs from anywhere in your body, including your mouth, can travel to your new joint and infect it.  Follow instructions from your health care provider about eating or drinking restrictions.  Ask your health care provider how your surgical site will be marked or identified.  You may be given antibiotic medicine to help prevent infection.  If your health care provider prescribes physical therapy, do exercises as instructed.  Do not use any tobacco products, such as cigarettes, chewing tobacco, or e-cigarettes. If you need help quitting, ask your health care provider.  You may have a physical exam.  You may have tests, such as: ? X-rays. ? MRI. ? CT scan. ? Bone scans.  You may have a blood or urine sample taken.  Plan to have someone take you home after the  procedure.  If you will be going home right after the procedure, plan to have someone with you for at least 24 hours. It is recommended that you have someone to help care for you for at least 4-6 weeks after your procedure. What happens during the procedure?  To reduce your risk of infection: ? Your health care team will wash or sanitize their hands. ? Your skin will be washed with soap.  An IV tube will be inserted into one of your veins.  You will be given one or more of the following: ? A medicine to help you relax (sedative). ? A medicine to numb the area (local anesthetic). ? A medicine to make you fall asleep (general anesthetic). ? A medicine that is injected into your spine to numb the area below and slightly above the injection site (spinal anesthetic). ? A medicine that is injected into an area of your body to numb everything below the injection site (regional anesthetic).  An incision will be made in your knee.  Damaged cartilage and bone will be removed from your femur, tibia, and patella.  Parts of the prosthesis (liners)will be placed over the areas of bone and cartilage that were removed. A metal liner will be placed over your femur, and plastic liners will be placed over your tibia and the underside of your patella.  One or more small tubes (drains) may be placed near your incision to help drain extra fluid from your surgical site.  Your incision will be closed with stitches (sutures), skin glue, or adhesive strips. Medicine may be applied to your incision.  A bandage (dressing) will be placed over your incision. The procedure may vary among health care providers and hospitals. What happens after the procedure?  Your blood pressure, heart rate, breathing rate, and blood oxygen level will be monitored often until the medicines you were given have worn off.  You may continue to receive fluids and medicines through an IV tube.  You will have some pain. Pain medicines  will be available to help you.  You may have fluid coming from one or more drains in your incision.  You may have to wear compression stockings. These stockings help to prevent blood clots and reduce swelling in your legs.  You will be encouraged to move around as much as possible.  You may be given a continuous passive motion machine to use at home. You will be shown how to use this machine.  Do not drive for 24 hours if you received a sedative. This information is not intended to replace advice given to you by your health care provider. Make sure you discuss any questions you have with your health care provider. Document Released: 04/20/2000 Document Revised: 09/16/2015 Document Reviewed: 12/19/2014 Elsevier Interactive Patient Education  2017 [place].  Total Knee Replacement, Care After Refer to this sheet in the next few weeks. These instructions provide you with information about caring for yourself after your procedure. Your health care provider may also give you more specific  instructions. Your treatment has been planned according to current medical practices, but problems sometimes occur. Call your health care provider if you have any problems or questions after your procedure. What can I expect after the procedure? After the procedure, it is common to have:  Pain and swelling.  A small amount of blood or clear fluid coming from your incision.  Limited range of motion.  Follow these instructions at home: Medicines  Take over-the-counter and prescription medicines only as told by your health care provider.  If you were prescribed an antibiotic medicine, take it as told by your health care provider. Do not stop taking the antibiotic even if you start to feel better.  If you were prescribed a blood thinner (anticoagulant), take it as told by your health care provider. If you have a splint or brace:  Wear the immobilizer as told by your health care provider. Remove it  only as told by your health care provider.  Loosen the immobilizer if your toes tingle, become numb, or turn cold and blue.  Do not let your immobilizer get wet if it is not waterproof.  Keep the immobilizer clean. Bathing   Do not take baths, swim, or use a hot tub until your health care provider approves. Ask your health care provider if you can take showers. You may only be allowed to take sponge baths for bathing.  If you have an immobilizer that is not waterproof, cover it with a watertight covering when you take a bath or shower.  Keep your bandage (dressing) dry until your health care provider says it can be removed. Incision care and drain care  Check your incision area and drain site every day for signs of infection. Check for: ? More redness, swelling, or pain. ? More fluid or blood. ? Warmth. ? Pus or a bad smell.  Follow instructions from your health care provider about how to take care of your incision. Make sure you: ? Wash your hands with soap and water before you change your dressing. If soap and water are not available, use hand sanitizer. ? Change your dressing as told by your health care provider. ? Leave stitches (sutures), skin glue, or adhesive strips in place. These skin closures may need to stay in place for 2 weeks or longer. If adhesive strip edges start to loosen and curl up, you may trim the loose edges. Do not remove adhesive strips completely unless your health care provider tells you to do that.  If you have a drain, follow instructions from your health care provider about caring for it. Do not remove the drain tube or any dressings around the tube opening unless your health care provider approves. Managing pain, stiffness, and swelling  If directed, put ice on your knee. ? Put ice in a plastic bag. ? Place a towel between your skin and the bag. ? Leave the ice on for 20 minutes, 2-3 times per day.  If directed, apply heat to the affected area as  often as told by your health care provider. Use the heat source that your health care provider recommends, such as a moist heat pack or a heating pad. ? Place a towel between your skin and the heat source. ? Leave the heat on for 20-30 minutes. ? Remove the heat if your skin turns bright red. This is especially important if you are unable to feel pain, heat, or cold. You may have a greater risk of getting burned.  Move your toes  often to avoid stiffness and to lessen swelling.  Raise (elevate) your knee above the level of your heart while you are sitting or lying down.  Wear elastic knee support for as long as told by your health care provider. Driving   Do not drive until your health care provider approves. Ask your health care provider when it is safe to drive if you have an immobilizer on your knee.  Do not drive or operate heavy machinery while taking prescription pain medicine.  Do not drive for 24 hours if you received a sedative. Activity  Do not lift anything that is heavier than 10 lb (4.5 kg) until your health care provider approves.  Do not play contact sports until your health care provider approves.  Avoid high-impact activities, including running, jumping rope, and jumping jacks.  Avoid sitting for a long time without moving. Get up and move around at least every few hours.  If physical therapy was prescribed, do exercises as told by your health care provider.  Return to your normal activities as told by your health care provider. Ask your health care provider what activities are safe for you. Safety  Do not use your leg to support your body weight until your health care provider approves. Use crutches or a walker as told by your health care provider. General instructions   Do not have any dental work done for at least 3 months after your surgery. When you do have dental work done, tell your dentist about your joint replacement.  Do not use any tobacco products,  such as cigarettes, chewing tobacco, or e-cigarettes. If you need help quitting, ask your health care provider.  Wear compression stockings as told by your health care provider. These stockings help to prevent blood clots and reduce swelling in your legs.  If you have been sent home with a continuous passive motion machine, use it as told by your health care provider.  Drink enough fluid to keep your urine clear or pale yellow.  If you have been instructed to lose weight, follow instructions from your health care provider about how to do this safely.  Keep all follow-up visits as told by your health care provider. This is important. Contact a health care provider if:  You have more redness, swelling, or pain around your incision or drain.  You have more fluid or blood coming from your incision or drain.  Your incision or drain site feels warm to the touch.  You have pus or a bad smell coming from your incision or drain.  You have a fever.  Your incision breaks open after your health care provider removes your sutures, skin glue, or adhesive tape.  Your prosthesis feels loose.  You have knee pain that does not go away. Get help right away if:  You have a rash.  You have pain or swelling in your calf or thigh.  You have shortness of breath or difficulty breathing.  You have chest pain.  Your range of motion in your knee is getting worse. This information is not intended to replace advice given to you by your health care provider. Make sure you discuss any questions you have with your health care provider. Document Released: 08/01/2004 Document Revised: 09/16/2015 Document Reviewed: 12/19/2014 Elsevier Interactive Patient Education  2017 [place].  Spinal Anesthesia and Epidural Anesthesia Spinal anesthesia and epidural anesthesia are ways to numb a part of the body. They are often used:  During childbirth.  During surgery on certain  parts of the body. These  include: ? Hip. ? Pelvis. ? Legs. ? Lower belly (abdomen).  After surgery on certain parts of the body. These include: ? Belly. ? Chest.  What happens before the procedure? Staying hydrated Follow instructions from your doctor about drinking (hydration). These may include:  Up to 2 hours before the procedure - you may continue to drink clear liquids. Some examples of clear liquids are: ? Water. ? Clear fruit juice. ? Black coffee. ? Plain tea.  Eating and drinking restrictions Follow instructions from your doctor about eating and drinking. These may include:  8 hours before the procedure - stop eating heavy meals or foods. Examples of these are meat, fried foods, or fatty foods.  6 hours before the procedure - stop eating light meals or foods. Examples of these are toast or cereal.  6 hours before the procedure - stop drinking milk or drinks that have milk in them.  2 hours before the procedure - stop drinking clear liquids.  Medicine Ask your doctor about:  Changing or stopping your regular medicines. This is especially important if you are taking diabetes medicines or blood thinners.  Changing or stopping your dietary supplements.  Taking medicines such as aspirin and ibuprofen. These medicines can thin your blood. Do not take these medicines before your procedure if your doctor tells you not to.  General instructions   Do not use any tobacco products for as long as possible. ? Examples of these are cigarettes, chewing tobacco, and e-cigarettes. ? If you need help quitting, ask your doctor.  Ask your doctor if you will have to stay overnight at the hospital or clinic.  If you will not have to stay overnight: ? Plan to have someone take you home. ? Plan to have someone with you for 24 hours. What happens during the procedure?  A doctor will put patches on your chest, a cuff around your arm, or a sensor device on your finger. They will be connected to  monitors.  An IV tube may be put into one of your veins. This tube is used to give fluids and medicines.  You may be given a medicine to help you relax (sedative).  You may be asked to: ? Sit up. ? Lie on your side. ? Bend your knees and chin toward your chest.  An area of your back will be cleaned.  You may get a shot of medicine in your back. The medicine will help prevent pain from the shot of numbing medicine.  A needle will be put between the bones of your back. While this is being done: ? Breathe normally. ? Try not to move. ? Stay as quiet as you can. ? Tell your doctor if you feel a tingling shock or pain going down your leg.  You will get the shot of numbing medicine.  If you need more medicine, a tube (catheter) may be put in the place where you got the shot. The tube will be used to give you more medicine. It will be left in if you need pain medicine after the procedure.  A bandage (dressing) may be put on your back. The procedure may vary among doctors and hospitals. What happens after the procedure?  Stay in bed until your doctor says it is safe to walk.  Your doctors will check on you often until the medicines wear off.  If there is a tube in your back, it will be taken out when it is  no longer needed.  Do not drive for 24 hours if you were given a medicine to help you relax (sedative).  It is common to feel sick to your stomach (nauseous) and itchy. There are medicines that can help. It is also common to: ? Be sleepy. ? Throw up (vomit). ? Have numbness or tingling in your legs. ? Have trouble peeing (urinating). This information is not intended to replace advice given to you by your health care provider. Make sure you discuss any questions you have with your health care provider. Document Released: 05/06/2015 Document Revised: 06/25/2015 Document Reviewed: 05/06/2015 Elsevier Interactive Patient Education  2018 [employer].  Spinal Anesthesia and  Epidural Anesthesia, Care After These instructions give you information about caring for yourself after your procedure. Your doctor may also give you more specific instructions. Call your doctor if you have any problems or questions after your procedure. Follow these instructions at home: For at least 24 hours after the procedure:   Do not: ? Do activities where you could fall or get hurt (injured). ? Drive. ? Use heavy machinery. ? Drink alcohol. ? Take sleeping pills or medicines that make you sleepy (drowsy). ? Make important decisions. ? Sign legal documents. ? Take care of children on your own.  Rest. Eating and drinking  If you throw up (vomit), drink water, juice, or soup when you can drink without throwing up.  Make sure you do not feel like throwing up (are not nauseous) before you eat.  Follow the diet that is recommended by your doctor. General instructions  Have a responsible adult stay with you until you are awake and alert.  Take over-the-counter and prescription medicines only as told by your doctor.  If you smoke, do not smoke unless an adult is watching.  Keep all follow-up visits as told by your doctor. This is important. Contact a doctor if:  It has been more than one day since your procedure and you feel like throwing up.  It has been more than one day since your procedure and you throw up.  You have a rash. Get help right away if:  You have a fever.  You have a headache that lasts a long time.  You have a very bad headache.  Your vision is blurry.  You see two of a single object (double vision).  You are dizzy or light-headed.  You faint.  Your arms or legs tingle, feel weak, or get numb.  You have trouble breathing.  You cannot pee (urinate). This information is not intended to replace advice given to you by your health care provider. Make sure you discuss any questions you have with your health care provider. Document Released:  05/06/2015 Document Revised: 09/05/2015 Document Reviewed: 05/06/2015 Elsevier Interactive Patient Education  2018 [place] Anesthesia, Adult General anesthesia is the use of medicines to make a person "go to sleep" (be unconscious) for a medical procedure. General anesthesia is often recommended when a procedure:  Is long.  Requires you to be still or in an unusual position.  Is major and can cause you to lose blood.  Is impossible to do without general anesthesia.  The medicines used for general anesthesia are called general anesthetics. In addition to making you sleep, the medicines:  Prevent pain.  Control your blood pressure.  Relax your muscles.  Tell a health care provider about:  Any allergies you have.  All medicines you are taking, including vitamins, herbs, eye drops, creams, and  over-the-counter medicines.  Any problems you or family members have had with anesthetic medicines.  Types of anesthetics you have had in the past.  Any bleeding disorders you have.  Any surgeries you have had.  Any medical conditions you have.  Any history of heart or lung conditions, such as heart failure, sleep apnea, or chronic obstructive pulmonary disease (COPD).  Whether you are pregnant or may be pregnant.  Whether you use tobacco, alcohol, marijuana, or street drugs.  Any history of Armed forces logistics/support/administrative officer.  Any history of depression or anxiety. What are the risks? Generally, this is a safe procedure. However, problems may occur, including:  Allergic reaction to anesthetics.  Lung and heart problems.  Inhaling food or liquids from your stomach into your lungs (aspiration).  Injury to nerves.  Waking up during your procedure and being unable to move (rare).  Extreme agitation or a state of mental confusion (delirium) when you wake up from the anesthetic.  Air in the bloodstream, which can lead to stroke.  These problems are more likely to develop if  you are having a major surgery or if you have an advanced medical condition. You can prevent some of these complications by answering all of your health care provider's questions thoroughly and by following all pre-procedure instructions. General anesthesia can cause side effects, including:  Nausea or vomiting  A sore throat from the breathing tube.  Feeling cold or shivery.  Feeling tired, washed out, or achy.  Sleepiness or drowsiness.  Confusion or agitation.  What happens before the procedure? Staying hydrated Follow instructions from your health care provider about hydration, which may include:  Up to 2 hours before the procedure - you may continue to drink clear liquids, such as water, clear fruit juice, black coffee, and plain tea.  Eating and drinking restrictions Follow instructions from your health care provider about eating and drinking, which may include:  8 hours before the procedure - stop eating heavy meals or foods such as meat, fried foods, or fatty foods.  6 hours before the procedure - stop eating light meals or foods, such as toast or cereal.  6 hours before the procedure - stop drinking milk or drinks that contain milk.  2 hours before the procedure - stop drinking clear liquids.  Medicines  Ask your health care provider about: ? Changing or stopping your regular medicines. This is especially important if you are taking diabetes medicines or blood thinners. ? Taking medicines such as aspirin and ibuprofen. These medicines can thin your blood. Do not take these medicines before your procedure if your health care provider instructs you not to. ? Taking new dietary supplements or medicines. Do not take these during the week before your procedure unless your health care provider approves them.  If you are told to take a medicine or to continue taking a medicine on the day of the procedure, take the medicine with sips of water. General instructions   Ask if  you will be going home the same day, the following day, or after a longer hospital stay. ? Plan to have someone take you home. ? Plan to have someone stay with you for the first 24 hours after you leave the hospital or clinic.  For 3-6 weeks before the procedure, try not to use any tobacco products, such as cigarettes, chewing tobacco, and e-cigarettes.  You may brush your teeth on the morning of the procedure, but make sure to spit out the toothpaste. What happens during the  procedure?  You will be given anesthetics through a mask and through an IV tube in one of your veins.  You may receive medicine to help you relax (sedative).  As soon as you are asleep, a breathing tube may be used to help you breathe.  An anesthesia specialist will stay with you throughout the procedure. He or she will help keep you comfortable and safe by continuing to give you medicines and adjusting the amount of medicine that you get. He or she will also watch your blood pressure, pulse, and oxygen levels to make sure that the anesthetics do not cause any problems.  If a breathing tube was used to help you breathe, it will be removed before you wake up. The procedure may vary among health care providers and hospitals. What happens after the procedure?  You will wake up, often slowly, after the procedure is complete, usually in a recovery area.  Your blood pressure, heart rate, breathing rate, and blood oxygen level will be monitored until the medicines you were given have worn off.  You may be given medicine to help you calm down if you feel anxious or agitated.  If you will be going home the same day, your health care provider may check to make sure you can stand, drink, and urinate.  Your health care providers will treat your pain and side effects before you go home.  Do not drive for 24 hours if you received a sedative.  You may: ? Feel nauseous and vomit. ? Have a sore throat. ? Have mental  slowness. ? Feel cold or shivery. ? Feel sleepy. ? Feel tired. ? Feel sore or achy, even in parts of your body where you did not have surgery. This information is not intended to replace advice given to you by your health care provider. Make sure you discuss any questions you have with your health care provider. Document Released: 04/21/2007 Document Revised: 06/25/2015 Document Reviewed: 12/27/2014 Elsevier Interactive Patient Education  2018 [place] Anesthesia, Adult, Care After These instructions provide you with information about caring for yourself after your procedure. Your health care provider may also give you more specific instructions. Your treatment has been planned according to current medical practices, but problems sometimes occur. Call your health care provider if you have any problems or questions after your procedure. What can I expect after the procedure? After the procedure, it is common to have:  Vomiting.  A sore throat.  Mental slowness.  It is common to feel:  Nauseous.  Cold or shivery.  Sleepy.  Tired.  Sore or achy, even in parts of your body where you did not have surgery.  Follow these instructions at home: For at least 24 hours after the procedure:  Do not: ? Participate in activities where you could fall or become injured. ? Drive. ? Use heavy machinery. ? Drink alcohol. ? Take sleeping pills or medicines that cause drowsiness. ? Make important decisions or sign legal documents. ? Take care of children on your own.  Rest. Eating and drinking  If you vomit, drink water, juice, or soup when you can drink without vomiting.  Drink enough fluid to keep your urine clear or pale yellow.  Make sure you have little or no nausea before eating solid foods.  Follow the diet recommended by your health care provider. General instructions  Have a responsible adult stay with you until you are awake and alert.  Return to your normal  activities as told  by your health care provider. Ask your health care provider what activities are safe for you.  Take over-the-counter and prescription medicines only as told by your health care provider.  If you smoke, do not smoke without supervision.  Keep all follow-up visits as told by your health care provider. This is important. Contact a health care provider if:  You continue to have nausea or vomiting at home, and medicines are not helpful.  You cannot drink fluids or start eating again.  You cannot urinate after 8-12 hours.  You develop a skin rash.  You have fever.  You have increasing redness at the site of your procedure. Get help right away if:  You have difficulty breathing.  You have chest pain.  You have unexpected bleeding.  You feel that you are having a life-threatening or urgent problem. This information is not intended to replace advice given to you by your health care provider. Make sure you discuss any questions you have with your health care provider. Document Released: 04/20/2000 Document Revised: 06/17/2015 Document Reviewed: 12/27/2014 Elsevier Interactive Patient Education  [employer].

## 2016-12-28 ENCOUNTER — Encounter (HOSPITAL_COMMUNITY): Payer: Self-pay

## 2016-12-28 ENCOUNTER — Other Ambulatory Visit: Payer: Self-pay

## 2016-12-28 ENCOUNTER — Encounter (HOSPITAL_COMMUNITY)
Admission: RE | Admit: 2016-12-28 | Discharge: 2016-12-28 | Disposition: A | Discharge status: Home / Self Care | Payer: Medicare Other | Source: Ambulatory Visit | Attending: Orthopedic Surgery | Admitting: Orthopedic Surgery

## 2016-12-28 HISTORY — DX: Personal history of urinary calculi: Z87.442

## 2016-12-28 LAB — BASIC METABOLIC PANEL
Anion gap: 12 (ref 5–15)
BUN: 26 mg/dL — ABNORMAL HIGH (ref 6–20)
CALCIUM: 9.7 mg/dL (ref 8.9–10.3)
CO2: 26 mmol/L (ref 22–32)
CREATININE: 1.19 mg/dL — AB (ref 0.44–1.00)
Chloride: 99 mmol/L — ABNORMAL LOW (ref 101–111)
GFR calc Af Amer: 54 mL/min — ABNORMAL LOW (ref >60)
GFR calc non Af Amer: 47 mL/min — ABNORMAL LOW (ref >60)
GLUCOSE: 96 mg/dL (ref 65–99)
Potassium: 4.5 mmol/L (ref 3.5–5.1)
SODIUM: 137 mmol/L (ref 135–145)

## 2016-12-28 LAB — CBC
HCT: 36.8 % (ref 36.0–46.0)
HEMOGLOBIN: 11.5 g/dL — AB (ref 12.0–15.0)
MCH: 24 pg — AB (ref 26.0–34.0)
MCHC: 31.3 g/dL (ref 30.0–36.0)
MCV: 76.8 fL — ABNORMAL LOW (ref 78.0–100.0)
PLATELETS: 588 10*3/uL — AB (ref 150–400)
RBC: 4.79 MIL/uL (ref 3.87–5.11)
RDW: 14.7 % (ref 11.5–15.5)
WBC: 10.1 10*3/uL (ref 4.0–10.5)

## 2016-12-28 LAB — SURGICAL PCR SCREEN
MRSA, PCR: NEGATIVE
Staphylococcus aureus: NEGATIVE

## 2016-12-30 ENCOUNTER — Telehealth: Payer: Self-pay | Admitting: Orthopedic Surgery

## 2016-12-30 MED ORDER — SODIUM CHLORIDE 0.9 % IV SOLN
1000.0000 mg | INTRAVENOUS | Status: AC
  Administered 2016-12-31: 1000 mg via INTRAVENOUS
  Filled 2016-12-30: qty 10

## 2016-12-30 NOTE — Telephone Encounter (Signed)
I have called her, after discussing labs with Dr Aline Brochure, her

## 2016-12-30 NOTE — H&P (Signed)
TOTAL KNEE ADMISSION H&P  Patient is being admitted for left total knee arthroplasty.  Subjective:  Chief Complaint:left knee pain.  HPI: Anzal Bartnick, 66 y.o. female, has a history of pain and functional disability in the left knee due to arthritis and has failed non-surgical conservative treatments for greater than 12 weeks to includeNSAID's and/or analgesics, corticosteriod injections, flexibility and strengthening excercises, use of assistive devices and activity modification.  Onset of symptoms was gradual, starting several years ago  years ago with gradually worsening course since that time.. She  currently rates pain in the left knee(s) at 6 out of 10 with activity. Patient has night pain, worsening of pain with activity and weight bearing, pain that interferes with activities of daily living, pain with passive range of motion, crepitus and joint swelling.  Patient has evidence of subchondral cysts, subchondral sclerosis and joint space narrowing by imaging studies.There is no active infection.  Patient Active Problem List   Diagnosis Date Noted  . Primary osteoarthritis 08/19/2016  . 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 Primary osteoarthritis of right knee    Past Medical History:  Diagnosis Date  . Anemia   . Arthritis   . History of kidney stones   . Hypertension   . Osteoporosis   . PONV (postoperative nausea and vomiting)    after hernia surgery    Past Surgical History:  Procedure Laterality Date  . CARPAL TUNNEL RELEASE Right 2011  . HERNIA REPAIR Left    inguinal  . TOTAL KNEE ARTHROPLASTY Right 08/19/2016   Procedure: TOTAL KNEE ARTHROPLASTY;  Surgeon: Carole Civil, MD;  Location: [facility];  Service: Orthopedics;  Laterality: Right;  . TUBAL LIGATION      No current facility-administered medications for this encounter.    Current Outpatient Medications  Medication Sig Dispense Refill Last Dose  . celecoxib (CELEBREX) 200 MG capsule Take 1  capsule (200 mg total) by mouth daily. Take with food. 30 capsule 5   . ferrous sulfate (FEOSOL) 325 (65 FE) MG tablet Take 1 tablet (325 mg total) by mouth 3 (three) times daily with meals. (Patient taking differently: Take 325 mg by mouth daily. ) 90 tablet 0 Taking  . hydrochlorothiazide (HYDRODIURIL) 25 MG tablet Take 25 mg by mouth daily.    Taking  . HYDROcodone-acetaminophen (NORCO) 5-325 MG tablet Take 1 tablet by mouth every 6 (six) hours as needed for moderate pain. 30 tablet 0   . ibuprofen (ADVIL,MOTRIN) 200 MG tablet Take 200 mg by mouth every 6 (six) hours as needed for headache or moderate pain.     . pramipexole (MIRAPEX) 0.125 MG tablet Take 0.25 mg by mouth at bedtime.    Taking  . aspirin EC 325 MG EC tablet Take 1 tablet (325 mg total) by mouth daily with breakfast. (Patient not taking: Reported on 11/16/2016) 30 tablet 0 Not Taking at Unknown time  . gabapentin (NEURONTIN) 100 MG capsule Take 2 capsules (200 mg total) by mouth 3 (three) times daily. (Patient not taking: Reported on 11/16/2016) 60 capsule 0 Not Taking at Unknown time   No Known Allergies  Social History   Tobacco Use  . Smoking status: Current Every Day Smoker    Packs/day: 0.50    Years: 48.00    Pack years: 24.00    Types: Cigarettes  . Smokeless tobacco: Never Used  Substance Use Topics  . Alcohol use: No    Family History  Problem Relation Age of Onset  .  Lung disease Mother   . Cancer Father   . Urticaria Neg Hx   . Eczema Neg Hx   . Immunodeficiency Neg Hx   . Atopy Neg Hx   . Asthma Neg Hx   . Angioedema Neg Hx   . Allergic rhinitis Neg Hx      Review of Systems  Constitutional: Negative for chills, fever and malaise/fatigue.  Musculoskeletal: Positive for back pain.  Neurological: Negative for weakness.  All other systems reviewed and are negative.   Objective:  Physical Exam  Constitutional: She is oriented to person, place, and time. She appears well-nourished.  Eyes: Right  eye exhibits no discharge. Left eye exhibits no discharge. No scleral icterus.  Neck: Neck supple. No JVD present. No tracheal deviation present.  Cardiovascular: Intact distal pulses.  Respiratory: Effort normal. No stridor.  GI: Soft. She exhibits no distension.  Musculoskeletal:       Arms:      Legs: Neurological: She is alert and oriented to person, place, and time. She has normal reflexes. She exhibits normal muscle tone. Coordination normal.  Skin: Skin is warm and dry. No rash noted. No erythema. No pallor.  Psychiatric: She has a normal mood and affect. Her behavior is normal. Thought content normal.    Vital signs in last 24 hours:    Labs:   Estimated body mass index is 25.7 kg/m as calculated from the following:   Height as of 12/28/16: 5\' 1"  (1.549 m).   Weight as of 12/28/16: 136 lb (61.7 kg).   Imaging Review Plain radiographs demonstrate severe degenerative joint disease of the left knee(s). The overall alignment ismild valgus. The bone quality appears to be fair for age and reported activity level.  Assessment/Plan:  Left total knee arthroplasty  End stage arthritis, left knee   The patient history, physical examination, clinical judgment of the provider and imaging studies are consistent with end stage degenerative joint disease of the left knee(s) and total knee arthroplasty is deemed medically necessary. The treatment options including medical management, injection therapy arthroscopy and arthroplasty were discussed at length. The risks and benefits of total knee arthroplasty were presented and reviewed. The risks due to aseptic loosening, infection, stiffness, patella tracking problems, thromboembolic complications and other imponderables were discussed. The patient acknowledged the explanation, agreed to proceed with the plan and consent was signed. Patient is being admitted for inpatient treatment for surgery, pain control, PT, OT, prophylactic antibiotics, VTE  prophylaxis, progressive ambulation and ADL's and discharge planning. The patient is planning to be discharged home with home health services

## 2016-12-30 NOTE — Telephone Encounter (Signed)
Kidney functions a little worse compared to previous.

## 2016-12-30 NOTE — Telephone Encounter (Signed)
Can we discuss? 

## 2016-12-30 NOTE — Telephone Encounter (Signed)
ok 

## 2016-12-30 NOTE — Telephone Encounter (Signed)
Patient calling to ask about results from pre-op lab work. Surgery is scheduled for tomorrow, 12/31/16

## 2016-12-31 ENCOUNTER — Inpatient Hospital Stay (HOSPITAL_COMMUNITY): Payer: Medicare Other | Admitting: Anesthesiology

## 2016-12-31 ENCOUNTER — Other Ambulatory Visit: Payer: Self-pay

## 2016-12-31 ENCOUNTER — Encounter (HOSPITAL_COMMUNITY): Payer: Self-pay | Admitting: Anesthesiology

## 2016-12-31 ENCOUNTER — Encounter (HOSPITAL_COMMUNITY)
Admission: RE | Disposition: A | Discharge status: Home Health | Payer: Self-pay | Source: Ambulatory Visit | Attending: Orthopedic Surgery

## 2016-12-31 ENCOUNTER — Inpatient Hospital Stay (HOSPITAL_COMMUNITY)
Admission: RE | Admit: 2016-12-31 | Discharge: 2017-01-05 | DRG: 470 | Disposition: A | Discharge status: Home Health | Payer: Medicare Other | Source: Ambulatory Visit | Attending: Orthopedic Surgery | Admitting: Orthopedic Surgery

## 2016-12-31 ENCOUNTER — Inpatient Hospital Stay (HOSPITAL_COMMUNITY): Payer: Medicare Other

## 2016-12-31 DIAGNOSIS — I10 Essential (primary) hypertension: Secondary | ICD-10-CM | POA: Diagnosis not present

## 2016-12-31 DIAGNOSIS — M1712 Unilateral primary osteoarthritis, left knee: Secondary | ICD-10-CM | POA: Diagnosis not present

## 2016-12-31 DIAGNOSIS — J449 Chronic obstructive pulmonary disease, unspecified: Secondary | ICD-10-CM | POA: Diagnosis not present

## 2016-12-31 DIAGNOSIS — Z96651 Presence of right artificial knee joint: Secondary | ICD-10-CM | POA: Diagnosis present

## 2016-12-31 DIAGNOSIS — Z7982 Long term (current) use of aspirin: Secondary | ICD-10-CM

## 2016-12-31 DIAGNOSIS — Z79899 Other long term (current) drug therapy: Secondary | ICD-10-CM

## 2016-12-31 DIAGNOSIS — F17210 Nicotine dependence, cigarettes, uncomplicated: Secondary | ICD-10-CM | POA: Diagnosis not present

## 2016-12-31 DIAGNOSIS — D62 Acute posthemorrhagic anemia: Secondary | ICD-10-CM | POA: Diagnosis not present

## 2016-12-31 DIAGNOSIS — M810 Age-related osteoporosis without current pathological fracture: Secondary | ICD-10-CM | POA: Diagnosis not present

## 2016-12-31 DIAGNOSIS — D508 Other iron deficiency anemias: Secondary | ICD-10-CM | POA: Diagnosis not present

## 2016-12-31 DIAGNOSIS — Z96659 Presence of unspecified artificial knee joint: Secondary | ICD-10-CM

## 2016-12-31 DIAGNOSIS — M25562 Pain in left knee: Secondary | ICD-10-CM | POA: Diagnosis present

## 2016-12-31 DIAGNOSIS — M25761 Osteophyte, right knee: Secondary | ICD-10-CM | POA: Diagnosis present

## 2016-12-31 HISTORY — PX: TOTAL KNEE ARTHROPLASTY: SHX125

## 2016-12-31 SURGERY — ARTHROPLASTY, KNEE, TOTAL
Anesthesia: General | Site: Knee | Laterality: Left

## 2016-12-31 MED ORDER — OXYCODONE HCL 5 MG/5ML PO SOLN
5.0000 mg | Freq: Once | ORAL | Status: DC | PRN
Start: 2016-12-31 — End: 2016-12-31

## 2016-12-31 MED ORDER — OXYCODONE HCL 5 MG PO TABS
5.0000 mg | ORAL_TABLET | Freq: Once | ORAL | Status: AC
  Administered 2016-12-31: 5 mg via ORAL

## 2016-12-31 MED ORDER — ONDANSETRON HCL 4 MG/2ML IJ SOLN
INTRAMUSCULAR | Status: AC
  Filled 2016-12-31: qty 2

## 2016-12-31 MED ORDER — PROMETHAZINE HCL 12.5 MG PO TABS: 12.5000 mg | ORAL_TABLET | ORAL | Status: DC | PRN

## 2016-12-31 MED ORDER — DIPHENHYDRAMINE HCL 12.5 MG/5ML PO ELIX: 12.5000 mg | ORAL_SOLUTION | ORAL | Status: DC | PRN

## 2016-12-31 MED ORDER — MENTHOL 3 MG MT LOZG: 1.0000 | LOZENGE | OROMUCOSAL | Status: DC | PRN

## 2016-12-31 MED ORDER — SENNOSIDES-DOCUSATE SODIUM 8.6-50 MG PO TABS: 1.0000 | ORAL_TABLET | Freq: Every evening | ORAL | Status: DC | PRN

## 2016-12-31 MED ORDER — BUPIVACAINE-EPINEPHRINE (PF) 0.5% -1:200000 IJ SOLN
INTRAMUSCULAR | Status: AC
  Filled 2016-12-31: qty 30

## 2016-12-31 MED ORDER — SODIUM CHLORIDE 0.9 % IV SOLN
INTRAVENOUS | Status: DC
  Administered 2016-12-31: 1000 mL via INTRAVENOUS

## 2016-12-31 MED ORDER — DOCUSATE SODIUM 100 MG PO CAPS
100.0000 mg | ORAL_CAPSULE | Freq: Two times a day (BID) | ORAL | Status: DC
  Administered 2016-12-31 – 2017-01-05 (×10): 100 mg via ORAL
  Filled 2016-12-31 (×10): qty 1

## 2016-12-31 MED ORDER — IPRATROPIUM-ALBUTEROL 0.5-2.5 (3) MG/3ML IN SOLN
3.0000 mL | Freq: Four times a day (QID) | RESPIRATORY_TRACT | Status: DC
  Administered 2016-12-31: 3 mL via RESPIRATORY_TRACT

## 2016-12-31 MED ORDER — SODIUM CHLORIDE 0.9 % IJ SOLN
INTRAMUSCULAR | Status: AC
  Filled 2016-12-31: qty 40

## 2016-12-31 MED ORDER — ONDANSETRON 4 MG PO TBDP
4.0000 mg | ORAL_TABLET | Freq: Once | ORAL | Status: AC
  Administered 2016-12-31: 4 mg via ORAL
  Filled 2016-12-31: qty 1

## 2016-12-31 MED ORDER — KETOROLAC TROMETHAMINE 15 MG/ML IJ SOLN
7.5000 mg | Freq: Four times a day (QID) | INTRAMUSCULAR | Status: AC
  Administered 2017-01-01 (×3): 7.5 mg via INTRAVENOUS
  Filled 2016-12-31 (×3): qty 1

## 2016-12-31 MED ORDER — BUPIVACAINE LIPOSOME 1.3 % IJ SUSP
INTRAMUSCULAR | Status: AC
  Filled 2016-12-31: qty 20

## 2016-12-31 MED ORDER — HYDROCODONE-ACETAMINOPHEN 7.5-325 MG PO TABS
1.0000 | ORAL_TABLET | ORAL | Status: DC
  Administered 2016-12-31 – 2017-01-05 (×26): 1 via ORAL
  Filled 2016-12-31 (×25): qty 1

## 2016-12-31 MED ORDER — KETAMINE HCL 10 MG/ML IJ SOLN
INTRAMUSCULAR | Status: AC
  Filled 2016-12-31: qty 1

## 2016-12-31 MED ORDER — CELECOXIB 400 MG PO CAPS
400.0000 mg | ORAL_CAPSULE | Freq: Once | ORAL | Status: AC
  Administered 2016-12-31: 400 mg via ORAL

## 2016-12-31 MED ORDER — OXYCODONE HCL 5 MG PO TABS: 10.0000 mg | ORAL_TABLET | ORAL | Status: DC | PRN

## 2016-12-31 MED ORDER — MIDAZOLAM HCL 2 MG/2ML IJ SOLN
1.0000 mg | Freq: Once | INTRAMUSCULAR | Status: AC | PRN
  Administered 2016-12-31: 2 mg via INTRAVENOUS
  Filled 2016-12-31: qty 2

## 2016-12-31 MED ORDER — 0.9 % SODIUM CHLORIDE (POUR BTL) OPTIME
TOPICAL | Status: DC | PRN
  Administered 2016-12-31: 1000 mL

## 2016-12-31 MED ORDER — FENTANYL CITRATE (PF) 100 MCG/2ML IJ SOLN
INTRAMUSCULAR | Status: AC
  Filled 2016-12-31: qty 2

## 2016-12-31 MED ORDER — PREGABALIN 50 MG PO CAPS
50.0000 mg | ORAL_CAPSULE | Freq: Once | ORAL | Status: AC
  Administered 2016-12-31: 50 mg via ORAL

## 2016-12-31 MED ORDER — BUPIVACAINE IN DEXTROSE 0.75-8.25 % IT SOLN
INTRATHECAL | Status: DC | PRN
  Administered 2016-12-31: 13 mg via INTRATHECAL

## 2016-12-31 MED ORDER — HYDRALAZINE HCL 20 MG/ML IJ SOLN
INTRAMUSCULAR | Status: AC
  Filled 2016-12-31: qty 1

## 2016-12-31 MED ORDER — SODIUM CHLORIDE 0.9 % IR SOLN
Status: DC | PRN
  Administered 2016-12-31: 3000 mL

## 2016-12-31 MED ORDER — FLEET ENEMA 7-19 GM/118ML RE ENEM: 1.0000 | ENEMA | Freq: Once | RECTAL | Status: DC | PRN

## 2016-12-31 MED ORDER — FENTANYL CITRATE (PF) 100 MCG/2ML IJ SOLN
INTRAMUSCULAR | Status: DC | PRN
  Administered 2016-12-31: 25 ug via INTRAVENOUS
  Administered 2016-12-31: 25 ug via INTRATHECAL
  Administered 2016-12-31 (×3): 25 ug via INTRAVENOUS
  Administered 2016-12-31: 10 ug via INTRAVENOUS
  Administered 2016-12-31: 15 ug via INTRATHECAL
  Administered 2016-12-31 (×2): 25 ug via INTRAVENOUS

## 2016-12-31 MED ORDER — PROPOFOL 500 MG/50ML IV EMUL
INTRAVENOUS | Status: DC | PRN
  Administered 2016-12-31: 100 ug/kg/min via INTRAVENOUS

## 2016-12-31 MED ORDER — DEXTROSE 5 % IV SOLN
500.0000 mg | Freq: Four times a day (QID) | INTRAVENOUS | Status: DC | PRN
  Filled 2016-12-31: qty 5

## 2016-12-31 MED ORDER — SODIUM CHLORIDE 0.9 % IV SOLN
INTRAVENOUS | Status: DC | PRN
  Administered 2016-12-31: 60 mL

## 2016-12-31 MED ORDER — METHOCARBAMOL 1000 MG/10ML IJ SOLN
500.0000 mg | Freq: Once | INTRAVENOUS | Status: AC
  Administered 2016-12-31: 500 mg via INTRAVENOUS
  Filled 2016-12-31: qty 5

## 2016-12-31 MED ORDER — PROPOFOL 10 MG/ML IV BOLUS
INTRAVENOUS | Status: AC
  Filled 2016-12-31: qty 40

## 2016-12-31 MED ORDER — PROMETHAZINE HCL 25 MG RE SUPP: 12.5000 mg | Freq: Four times a day (QID) | RECTAL | Status: DC | PRN

## 2016-12-31 MED ORDER — PRAMIPEXOLE DIHYDROCHLORIDE 0.25 MG PO TABS
0.2500 mg | ORAL_TABLET | Freq: Every day | ORAL | Status: DC
  Administered 2016-12-31 – 2017-01-04 (×5): 0.25 mg via ORAL
  Filled 2016-12-31 (×6): qty 1

## 2016-12-31 MED ORDER — ASPIRIN EC 325 MG PO TBEC
325.0000 mg | DELAYED_RELEASE_TABLET | Freq: Every day | ORAL | Status: DC
  Administered 2017-01-01 – 2017-01-05 (×5): 325 mg via ORAL
  Filled 2016-12-31 (×5): qty 1

## 2016-12-31 MED ORDER — HYDROCHLOROTHIAZIDE 25 MG PO TABS
25.0000 mg | ORAL_TABLET | Freq: Every day | ORAL | Status: DC
  Administered 2016-12-31 – 2017-01-05 (×6): 25 mg via ORAL
  Filled 2016-12-31 (×6): qty 1

## 2016-12-31 MED ORDER — SODIUM CHLORIDE 0.9 % IV SOLN
INTRAVENOUS | Status: DC
  Administered 2016-12-31 – 2017-01-01 (×4): via INTRAVENOUS

## 2016-12-31 MED ORDER — CHLORHEXIDINE GLUCONATE 4 % EX LIQD: 60.0000 mL | Freq: Once | CUTANEOUS | Status: DC

## 2016-12-31 MED ORDER — METOPROLOL TARTRATE 5 MG/5ML IV SOLN
INTRAVENOUS | Status: DC | PRN
  Administered 2016-12-31 (×2): 2.5 mg via INTRAVENOUS

## 2016-12-31 MED ORDER — OXYCODONE HCL 5 MG PO TABS: 5.0000 mg | ORAL_TABLET | ORAL | Status: DC | PRN

## 2016-12-31 MED ORDER — IPRATROPIUM-ALBUTEROL 0.5-2.5 (3) MG/3ML IN SOLN
RESPIRATORY_TRACT | Status: AC
  Filled 2016-12-31: qty 3

## 2016-12-31 MED ORDER — MIDAZOLAM HCL 2 MG/2ML IJ SOLN
INTRAMUSCULAR | Status: AC
  Filled 2016-12-31: qty 2

## 2016-12-31 MED ORDER — METHOCARBAMOL 500 MG PO TABS
500.0000 mg | ORAL_TABLET | Freq: Four times a day (QID) | ORAL | Status: DC | PRN
  Administered 2017-01-02 – 2017-01-04 (×5): 500 mg via ORAL
  Filled 2016-12-31 (×5): qty 1

## 2016-12-31 MED ORDER — BISACODYL 5 MG PO TBEC: 5.0000 mg | DELAYED_RELEASE_TABLET | Freq: Every day | ORAL | Status: DC | PRN

## 2016-12-31 MED ORDER — HYDROMORPHONE HCL 1 MG/ML IJ SOLN
0.5000 mg | INTRAMUSCULAR | Status: DC | PRN
  Administered 2016-12-31: 0.5 mg via INTRAVENOUS
  Filled 2016-12-31: qty 1

## 2016-12-31 MED ORDER — PHENOL 1.4 % MT LIQD: 1.0000 | OROMUCOSAL | Status: DC | PRN

## 2016-12-31 MED ORDER — HYDRALAZINE HCL 20 MG/ML IJ SOLN
INTRAMUSCULAR | Status: DC | PRN
  Administered 2016-12-31: 5 mg via INTRAVENOUS

## 2016-12-31 MED ORDER — PROPOFOL 10 MG/ML IV BOLUS
INTRAVENOUS | Status: DC | PRN
  Administered 2016-12-31: 100 mg via INTRAVENOUS

## 2016-12-31 MED ORDER — PROPOFOL 10 MG/ML IV BOLUS
INTRAVENOUS | Status: AC
  Filled 2016-12-31: qty 20

## 2016-12-31 MED ORDER — ONDANSETRON HCL 4 MG PO TABS: 4.0000 mg | ORAL_TABLET | Freq: Four times a day (QID) | ORAL | Status: DC | PRN

## 2016-12-31 MED ORDER — METOCLOPRAMIDE HCL 5 MG/ML IJ SOLN
5.0000 mg | Freq: Three times a day (TID) | INTRAMUSCULAR | Status: DC | PRN
  Administered 2016-12-31: 10 mg via INTRAVENOUS
  Filled 2016-12-31: qty 2

## 2016-12-31 MED ORDER — BUPIVACAINE-EPINEPHRINE (PF) 0.5% -1:200000 IJ SOLN
INTRAMUSCULAR | Status: DC | PRN
  Administered 2016-12-31: 30 mL via PERINEURAL

## 2016-12-31 MED ORDER — TRANEXAMIC ACID 1000 MG/10ML IV SOLN: 1000.0000 mg | Freq: Once | INTRAVENOUS | Status: DC

## 2016-12-31 MED ORDER — ALUM & MAG HYDROXIDE-SIMETH 200-200-20 MG/5ML PO SUSP: 30.0000 mL | ORAL | Status: DC | PRN

## 2016-12-31 MED ORDER — DEXAMETHASONE SODIUM PHOSPHATE 4 MG/ML IJ SOLN
10.0000 mg | Freq: Once | INTRAMUSCULAR | Status: AC
  Administered 2017-01-01: 10 mg via INTRAVENOUS
  Filled 2016-12-31: qty 3

## 2016-12-31 MED ORDER — OXYCODONE HCL 5 MG PO TABS
ORAL_TABLET | ORAL | Status: AC
  Filled 2016-12-31: qty 1

## 2016-12-31 MED ORDER — CELECOXIB 400 MG PO CAPS
ORAL_CAPSULE | ORAL | Status: AC
  Filled 2016-12-31: qty 1

## 2016-12-31 MED ORDER — PROMETHAZINE HCL 25 MG/ML IJ SOLN: 12.5000 mg | Freq: Four times a day (QID) | INTRAMUSCULAR | Status: DC | PRN

## 2016-12-31 MED ORDER — OXYCODONE HCL 5 MG PO TABS: 5.0000 mg | ORAL_TABLET | Freq: Once | ORAL | Status: DC | PRN

## 2016-12-31 MED ORDER — LACTATED RINGERS IV SOLN
INTRAVENOUS | Status: DC | PRN
  Administered 2016-12-31: 09:00:00 via INTRAVENOUS

## 2016-12-31 MED ORDER — ONDANSETRON HCL 4 MG/2ML IJ SOLN
4.0000 mg | Freq: Four times a day (QID) | INTRAMUSCULAR | Status: DC | PRN
  Administered 2016-12-31: 4 mg via INTRAVENOUS
  Filled 2016-12-31: qty 2

## 2016-12-31 MED ORDER — METOPROLOL TARTRATE 5 MG/5ML IV SOLN
INTRAVENOUS | Status: AC
  Filled 2016-12-31: qty 5

## 2016-12-31 MED ORDER — GABAPENTIN 300 MG PO CAPS
300.0000 mg | ORAL_CAPSULE | Freq: Three times a day (TID) | ORAL | Status: DC
  Administered 2016-12-31 – 2017-01-05 (×15): 300 mg via ORAL
  Filled 2016-12-31 (×15): qty 1

## 2016-12-31 MED ORDER — PREGABALIN 50 MG PO CAPS
ORAL_CAPSULE | ORAL | Status: AC
  Filled 2016-12-31: qty 1

## 2016-12-31 MED ORDER — SUCCINYLCHOLINE CHLORIDE 20 MG/ML IJ SOLN
INTRAMUSCULAR | Status: AC
  Filled 2016-12-31: qty 1

## 2016-12-31 MED ORDER — CEFAZOLIN SODIUM-DEXTROSE 2-4 GM/100ML-% IV SOLN
2.0000 g | Freq: Four times a day (QID) | INTRAVENOUS | Status: AC
  Administered 2016-12-31 (×2): 2 g via INTRAVENOUS
  Filled 2016-12-31 (×3): qty 100

## 2016-12-31 MED ORDER — CEFAZOLIN SODIUM-DEXTROSE 2-4 GM/100ML-% IV SOLN
2.0000 g | INTRAVENOUS | Status: AC
  Administered 2016-12-31: 2 g via INTRAVENOUS
  Filled 2016-12-31: qty 100

## 2016-12-31 MED ORDER — FERROUS SULFATE 325 (65 FE) MG PO TABS
325.0000 mg | ORAL_TABLET | Freq: Every day | ORAL | Status: DC
  Administered 2016-12-31 – 2017-01-05 (×6): 325 mg via ORAL
  Filled 2016-12-31 (×6): qty 1

## 2016-12-31 MED ORDER — KETAMINE HCL 10 MG/ML IJ SOLN
INTRAMUSCULAR | Status: DC | PRN
  Administered 2016-12-31 (×2): 10 mg via INTRAVENOUS

## 2016-12-31 MED ORDER — ONDANSETRON HCL 4 MG/2ML IJ SOLN
4.0000 mg | Freq: Once | INTRAMUSCULAR | Status: AC
  Administered 2016-12-31: 4 mg via INTRAVENOUS

## 2016-12-31 MED ORDER — METOCLOPRAMIDE HCL 10 MG PO TABS: 5.0000 mg | ORAL_TABLET | Freq: Three times a day (TID) | ORAL | Status: DC | PRN

## 2016-12-31 MED ORDER — HYDROMORPHONE HCL 1 MG/ML IJ SOLN
0.2500 mg | INTRAMUSCULAR | Status: DC | PRN
  Administered 2016-12-31: 0.5 mg via INTRAVENOUS
  Filled 2016-12-31: qty 1

## 2016-12-31 SURGICAL SUPPLY — 65 items
BAG HAMPER (MISCELLANEOUS) ×2 IMPLANT
BANDAGE ELASTIC 4 LF NS (GAUZE/BANDAGES/DRESSINGS) ×4 IMPLANT
BANDAGE ELASTIC 6 LF NS (GAUZE/BANDAGES/DRESSINGS) ×2 IMPLANT
BANDAGE ESMARK 6X9 LF (GAUZE/BANDAGES/DRESSINGS) ×1 IMPLANT
BIT DRILL 3.2X128 (BIT) ×2 IMPLANT
BLADE HEX COATED 2.75 (ELECTRODE) ×2 IMPLANT
BLADE SAGITTAL 25.0X1.27X90 (BLADE) ×2 IMPLANT
BNDG ESMARK 6X9 LF (GAUZE/BANDAGES/DRESSINGS) ×2
CAP KNEE TOTAL 3 SIGMA ×2 IMPLANT
CEMENT HV SMART SET (Cement) ×4 IMPLANT
CLOTH BEACON ORANGE TIMEOUT ST (SAFETY) ×2 IMPLANT
COOLER CRYO CUFF IC AND MOTOR (MISCELLANEOUS) ×2 IMPLANT
COVER LIGHT HANDLE STERIS (MISCELLANEOUS) ×4 IMPLANT
CUFF CRYO KNEE18X23 MED (MISCELLANEOUS) ×2 IMPLANT
CUFF TOURNIQUET SINGLE 34IN LL (TOURNIQUET CUFF) ×2 IMPLANT
DECANTER SPIKE VIAL GLASS SM (MISCELLANEOUS) ×2 IMPLANT
DRAPE BACK TABLE (DRAPES) ×2 IMPLANT
DRAPE EXTREMITY T 121X128X90 (DRAPE) ×2 IMPLANT
DRESSING AQUACEL AG ADV 3.5X12 (MISCELLANEOUS) ×1 IMPLANT
DRSG AQUACEL AG ADV 3.5X12 (MISCELLANEOUS) ×2
DRSG MEPILEX BORDER 4X12 (GAUZE/BANDAGES/DRESSINGS) ×2 IMPLANT
DURAPREP 26ML APPLICATOR (WOUND CARE) ×4 IMPLANT
ELECT REM PT RETURN 9FT ADLT (ELECTROSURGICAL) ×2
ELECTRODE REM PT RTRN 9FT ADLT (ELECTROSURGICAL) ×1 IMPLANT
EVACUATOR 3/16  PVC DRAIN (DRAIN) ×1
EVACUATOR 3/16 PVC DRAIN (DRAIN) ×1 IMPLANT
GLOVE BIO SURGEON STRL SZ7 (GLOVE) ×4 IMPLANT
GLOVE BIOGEL PI IND STRL 7.0 (GLOVE) ×2 IMPLANT
GLOVE BIOGEL PI INDICATOR 7.0 (GLOVE) ×2
GLOVE SKINSENSE NS SZ8.0 LF (GLOVE) ×2
GLOVE SKINSENSE STRL SZ8.0 LF (GLOVE) ×2 IMPLANT
GLOVE SS N UNI LF 8.5 STRL (GLOVE) ×2 IMPLANT
GOWN STRL REUS W/ TWL LRG LVL3 (GOWN DISPOSABLE) ×1 IMPLANT
GOWN STRL REUS W/TWL LRG LVL3 (GOWN DISPOSABLE) ×5 IMPLANT
HANDPIECE INTERPULSE COAX TIP (DISPOSABLE) ×1
HOOD W/PEELAWAY (MISCELLANEOUS) ×6 IMPLANT
INST SET MAJOR BONE (KITS) ×2 IMPLANT
IV NS IRRIG 3000ML ARTHROMATIC (IV SOLUTION) ×2 IMPLANT
KIT BLADEGUARD II DBL (SET/KITS/TRAYS/PACK) ×2 IMPLANT
KIT ROOM TURNOVER APOR (KITS) ×2 IMPLANT
MANIFOLD NEPTUNE II (INSTRUMENTS) ×2 IMPLANT
MARKER SKIN DUAL TIP RULER LAB (MISCELLANEOUS) ×2 IMPLANT
NEEDLE HYPO 21X1.5 SAFETY (NEEDLE) ×2 IMPLANT
NS IRRIG 1000ML POUR BTL (IV SOLUTION) ×2 IMPLANT
PACK TOTAL JOINT (CUSTOM PROCEDURE TRAY) ×2 IMPLANT
PAD ARMBOARD 7.5X6 YLW CONV (MISCELLANEOUS) ×2 IMPLANT
PAD DANNIFLEX CPM (ORTHOPEDIC SUPPLIES) ×2 IMPLANT
PIN TROCAR 3 INCH (PIN) IMPLANT
SAW OSC TIP CART 19.5X105X1.3 (SAW) ×2 IMPLANT
SET BASIN LINEN APH (SET/KITS/TRAYS/PACK) ×2 IMPLANT
SET HNDPC FAN SPRY TIP SCT (DISPOSABLE) ×1 IMPLANT
STAPLER VISISTAT 35W (STAPLE) ×2 IMPLANT
SUT BRALON NAB BRD #1 30IN (SUTURE) ×4 IMPLANT
SUT MNCRL 0 VIOLET CTX 36 (SUTURE) ×1 IMPLANT
SUT MON AB 0 CT1 (SUTURE) ×2 IMPLANT
SUT MON AB 2-0 CT1 36 (SUTURE) IMPLANT
SUT MONOCRYL 0 CTX 36 (SUTURE) ×1
SYR 20CC LL (SYRINGE) ×6 IMPLANT
SYR 30ML LL (SYRINGE) ×4 IMPLANT
SYR BULB IRRIGATION 50ML (SYRINGE) ×2 IMPLANT
TOWEL OR 17X26 4PK STRL BLUE (TOWEL DISPOSABLE) ×2 IMPLANT
TOWER CARTRIDGE SMART MIX (DISPOSABLE) ×2 IMPLANT
TRAY FOLEY W/METER SILVER 16FR (SET/KITS/TRAYS/PACK) ×2 IMPLANT
WATER STERILE IRR 1000ML POUR (IV SOLUTION) ×4 IMPLANT
YANKAUER SUCT 12FT TUBE ARGYLE (SUCTIONS) ×2 IMPLANT

## 2016-12-31 NOTE — Anesthesia Postprocedure Evaluation (Signed)
Anesthesia Post Note  Patient: Rhonda Briggs  Procedure(s) Performed: TOTAL KNEE ARTHROPLASTY (Left Knee)  Patient location during evaluation: PACU Anesthesia Type: Spinal and General Level of consciousness: awake, oriented and patient cooperative Pain management: pain level controlled Vital Signs Assessment: post-procedure vital signs reviewed and stable Respiratory status: spontaneous breathing and respiratory function stable Cardiovascular status: stable Postop Assessment: no apparent nausea or vomiting Anesthetic complications: no     Last Vitals:  Vitals:   12/31/16 0720 12/31/16 0725  BP: 135/70 125/82  Pulse:    Resp: (!) 24 19  Temp:    SpO2: 99% 100%    Last Pain:  Vitals:   12/31/16 0642  TempSrc: Oral  PainSc: 7                  ADAMS, AMY A

## 2016-12-31 NOTE — Plan of Care (Signed)
  Acute Rehab PT Goals(only PT should resolve) Pt Will Go Supine/Side To Sit 12/31/2016 1514 - Progressing by Lonell Grandchild, PT Flowsheets Taken 12/31/2016 1514  Pt will go Supine/Side to Sit Independently Pt Will Go Sit To Supine/Side 12/31/2016 1514 - Progressing by Lonell Grandchild, PT Flowsheets Taken 12/31/2016 1514  Pt will go Sit to Supine/Side Independently Patient Will Transfer Sit To/From Stand 12/31/2016 1514 - Progressing by Lonell Grandchild, PT Flowsheets Taken 12/31/2016 1514  Patient will transfer sit to/from stand with modified independence Pt Will Transfer Bed To Chair/Chair To Bed 12/31/2016 1514 - Progressing by Lonell Grandchild, PT Flowsheets Taken 12/31/2016 1514  Pt will Transfer Bed to Chair/Chair to Bed with modified independence Pt Will Ambulate 12/31/2016 1514 - Progressing by Lonell Grandchild, PT Flowsheets Taken 12/31/2016 1514  Pt will Ambulate > 125 feet;with modified independence;with rolling walker  3:15 PM, 12/31/16 Lonell Grandchild, MPT Physical Therapist with [facility] [phone] office [phone] mobile phone

## 2016-12-31 NOTE — Interval H&P Note (Signed)
History and Physical Interval Note:  12/31/2016 7:25 AM  Rhonda Briggs  has presented today for surgery, with the diagnosis of PRIMARY OSTEOARTHRITIS KNEE  The various methods of treatment have been discussed with the patient and family. After consideration of risks, benefits and other options for treatment, the patient has consented to  Procedure(s): TOTAL KNEE ARTHROPLASTY (Left) as a surgical intervention .  The patient's history has been reviewed, patient examined, no change in status, stable for surgery.  I have reviewed the patient's chart and labs.  Questions were answered to the patient's satisfaction.     Arther Abbott

## 2016-12-31 NOTE — Anesthesia Procedure Notes (Signed)
Procedure Name: MAC Date/Time: 12/31/2016 7:30 AM Performed by: Andree Elk Amy A, CRNA Pre-anesthesia Checklist: Patient identified, Timeout performed, Emergency Drugs available, Suction available and Patient being monitored Oxygen Delivery Method: Simple face mask

## 2016-12-31 NOTE — Transfer of Care (Signed)
Immediate Anesthesia Transfer of Care Note  Patient: Rhonda Briggs  Procedure(s) Performed: TOTAL KNEE ARTHROPLASTY (Left Knee)  Patient Location: PACU  Anesthesia Type:General  Level of Consciousness: drowsy  Airway & Oxygen Therapy: Patient Spontanous Breathing and Patient connected to face mask oxygen  Post-op Assessment: Report given to RN and Post -op Vital signs reviewed and stable  Post vital signs: Reviewed and stable  Last Vitals:  Vitals:   12/31/16 0720 12/31/16 0725  BP: 135/70 125/82  Pulse:    Resp: (!) 24 19  Temp:    SpO2: 99% 100%    Last Pain:  Vitals:   12/31/16 0642  TempSrc: Oral  PainSc: 7       Patients Stated Pain Goal: 7 (94/[id number])  Complications: No apparent anesthesia complications

## 2016-12-31 NOTE — Anesthesia Preprocedure Evaluation (Signed)
Anesthesia Evaluation  Patient identified by MRN, date of birth, ID band Patient awake    History of Anesthesia Complications (+) PONV  Airway Mallampati: I  TM Distance: >3 FB Neck ROM: Full    Dental  (+) Teeth Intact   Pulmonary COPD, Current Smoker,   Light crackles, especially on L upper quadrant   - rhonchi       Cardiovascular hypertension, Pt. on medications  Rhythm:Regular Rate:Normal  Normal sinus rhythm Low voltage QRS Incomplete right bundle branch block Borderline ECG     (May 2018)   Neuro/Psych    GI/Hepatic   Endo/Other    Renal/GU Renal InsufficiencyRenal diseaseResults for DAYANNA, PRYCE (MRN [id number]) as of 12/31/2016 06:16  12/28/2016 15:15 Sodium: 137 Potassium: 4.5 Chloride: 99 (L) CO2: 26 Glucose: 96 BUN: 26 (H) Creatinine: 1.19 (H)      Musculoskeletal   Abdominal   Peds  Hematology  (+) anemia , Results for CARRA, BRINDLEY (MRN [id number]) as of 12/31/2016 06:16  12/28/2016 15:15 WBC: 10.1 RBC: 4.79 Hemoglobin: 11.5 (L) HCT: 36.8 MCV: 76.8 (L) MCH: 24.0 (L) MCHC: 31.3 RDW: 14.7 Platelets: 588 (H)    Anesthesia Other Findings   Reproductive/Obstetrics                            Anesthesia Physical Anesthesia Plan  ASA: III  Anesthesia Plan: Spinal   Post-op Pain Management:    Induction:   PONV Risk Score and Plan:   Airway Management Planned: Nasal Cannula  Additional Equipment:   Intra-op Plan:   Post-operative Plan:   Informed Consent: I have reviewed the patients History and Physical, chart, labs and discussed the procedure including the risks, benefits and alternatives for the proposed anesthesia with the patient or authorized representative who has indicated his/her understanding and acceptance.   Dental advisory given  Plan Discussed with: CRNA and Surgeon  Anesthesia Plan Comments:         Anesthesia Quick  Evaluation

## 2016-12-31 NOTE — Op Note (Signed)
12/31/2016  9:55 AM  PATIENT:  Rhonda Briggs  66 y.o. female  PRE-OPERATIVE DIAGNOSIS:  PRIMARY OSTEOARTHRITIS  LEFT KNEE  POST-OPERATIVE DIAGNOSIS:  PRIMARY OSTEOARTHRITIS  LEFT KNEE  FINDINGS:  Severe chondral arthrosis lateral compartment, grade 4  Patella thickness 16 mm with grade 2 diffuse chondral degeneration  Mild degeneration medial compartment grade 1  PROCEDURE:  Procedure(s): TOTAL KNEE ARTHROPLASTY (Left)   DEPUY: 2.67F  2.0T  10 PS POLY   NO PATELLA   SURGEON:  Surgeon(s) and Role:    Carole Civil, MD - Primary  PHYSICIAN ASSISTANT:   ASSISTANTS: CYNTHIA WRENN    ANESTHESIA:   spinal and general  EBL:  25 mL   BLOOD ADMINISTERED:none  DRAINS: 1 HEMOVAC    LOCAL MEDICATIONS USED:  MARCAINE W EPI   , Amount: 30  ml and OTHER EXPAREL 20 DILUTED WITH 40 CC SALINE    SPECIMEN:  No Specimen  DISPOSITION OF SPECIMEN:  N/A  COUNTS:  YES  TOURNIQUET:   Total Tourniquet Time Documented: Thigh (Left) - 81 minutes Total: Thigh (Left) - 81 minutes   DICTATION: .Viviann Spare Dictation     The patient was identified in the preop holding area and the surgical site was confirmed as the left knee. Chart review and update were completed. The patient was taken to the operating room for spinal anesthesia. After successful spinal anesthesia Foley catheter was inserted. The patient was placed supine on the operating table.  Supplemental general anesthesia was administered due to incomplete spinal anesthetic   the left leg was prepped with DuraPrep and draped sterilely. Timeout was completed. The limb was then exsanguinated a  6 inch Esmarch. The tourniquet was elevated to 300 mmHg.   A midline incision was made and taken down to the extensor mechanism followed by medial arthrotomy. The patella was everted. A synovectomy was performed as needed. The osteophytes were resected.  Anterior cruciate ligament and PCL and medial and lateral meniscus were resected.    a 3/8 inch drill bit was used to enter the femoral canal which was suctioned and irrigated until the fluid was clear. The distal femoral cut was set for 11 millimeter resection with a 5   Left Valgus angle. This cut was completed and checked for flatness.   the femur was then measured to a size 2.5.  The cutting block was placed to match the epicondyles and the 4 distal cuts were made.   the tibia was subluxated forward and the external alignment guide was placed. We removed 2 mm of bone from the lower lateral  side. We set the guide for neutral varus valgus cut related to the  Mechanical axis of the tibia and for slope matching the patient's anatomy. Rotational alignment was set using the tibial tubercle, tibial spine and second metatarsal. The cutting block was pinned and the proximal tibia was resected.    spacer blocks were placed starting with a 10 mm insert, this did not fit and we recut the tibia taking an additional 2 mm of bone.  We then reinserted the 10 mm spacer block to confirm equal flexion-extension gaps.  Laminar spreaders were applied and additional medial release was performed until A size  10  mm insert balanced the gaps.   We placed the femoral notch cutting guide size 2.5  and resected the notch.   Trial implants were placed using appropriate size femur , appropriate size tibial baseplate which was measured after the proximal tibia resection. Tibial rotation  was set patella tracking was normal   The tibia was then punched per manufacture technique making sure to avoid internal rotation.   The patella measured a size 16 mm and was not resected      Final range of motion check was performed with the appropriate size trials as mentioned above. Satisfactory reduction and motion were obtained.   Trial implants were removed. The bone was irrigated and dried and the cement was mixed on the back table  exparel was injected in the soft tissues and posterior capsule of the  knee  These implants were then cemented in place. Excess cement was removed. The cement was allowed to cure. Second irrigation was performed.    FInal range of motion check and stability check was completed  The wound was irrigated third time Hemovac drain was placed, extensor mechanism was closed with #1 Nurolon followed by 0 Monocryl and staples to reapproximate the skin edges and subcutaneous tissue.   Sterile dressing was applied  The patient was taken recovery in stable condition    PLAN OF CARE: Admit to inpatient   PATIENT DISPOSITION:  PACU - hemodynamically stable.   Delay start of Pharmacological VTE agent (>24hrs) due to surgical blood loss or risk of bleeding: yes  [phone]

## 2016-12-31 NOTE — Anesthesia Procedure Notes (Signed)
Procedure Name: LMA Insertion Date/Time: 12/31/2016 8:38 AM Performed by: Istvan Behar A, CRNA Pre-anesthesia Checklist: Patient identified, Timeout performed, Emergency Drugs available, Suction available and Patient being monitored Patient Re-evaluated:Patient Re-evaluated prior to induction Oxygen Delivery Method: Circle system utilized Preoxygenation: Pre-oxygenation with 100% oxygen Ventilation: Mask ventilation without difficulty LMA: LMA inserted LMA Size: 3.0 Number of attempts: 1 Placement Confirmation: positive ETCO2 and breath sounds checked- equal and bilateral Tube secured with: Tape Dental Injury: Teeth and Oropharynx as per pre-operative assessment

## 2016-12-31 NOTE — Anesthesia Procedure Notes (Signed)
Spinal  Patient location during procedure: OR Start time: 12/31/2016 7:53 AM Staffing Anesthesiologist: Mikey College, MD Resident/CRNA: Mickel Baas, CRNA Performed: anesthesiologist  Preanesthetic Checklist Completed: patient identified, site marked, surgical consent, pre-op evaluation, timeout performed, IV checked, risks and benefits discussed and monitors and equipment checked Spinal Block Patient position: left lateral decubitus Prep: Betadine Patient monitoring: heart rate, cardiac monitor, continuous pulse ox and blood pressure Approach: left paramedian Location: L3-4 Injection technique: single-shot Needle Needle type: Spinocan  Needle gauge: 22 G Needle length: 9 cm Assessment Sensory level: T8 Additional Notes  ATTEMPTS:2 by Oblong; 2 by Dr. Daiva Huge FY:[id number] [place] DATE:11/26/2018

## 2016-12-31 NOTE — Evaluation (Signed)
Physical Therapy Evaluation Patient Details Name: Rhonda Briggs MRN: [id number] DOB: 1950-03-14 Today's Date: 12/31/2016   History of Present Illness  Rhonda Briggs is a 66 y/o female s/p Left TKA 12/31/16, with the diagnosis of PRIMARY OSTEOARTHRITIS KNEE    Clinical Impression  Patient instructed in and given written instructions for HEP with good return demonstrated, limited for out of bed and gait secondary to c/o severe nausea/vomiting - RN and MD aware.  LEFT KNEE PROM: 0-85 degrees.  Patient will benefit from continued physical therapy in hospital and recommended venue below to increase strength, balance, endurance for safe ADLs and gait.    Follow Up Recommendations Home health PT;Supervision for mobility/OOB    Equipment Recommendations  None recommended by PT    Recommendations for Other Services       Precautions / Restrictions Precautions Precautions: Fall Restrictions Weight Bearing Restrictions: Yes LLE Weight Bearing: Weight bearing as tolerated Other Position/Activity Restrictions: no pillows underneath left knee      Mobility  Bed Mobility Overal bed mobility: Needs Assistance Bed Mobility: Supine to Sit;Sit to Supine     Supine to sit: Min guard Sit to supine: Min guard      Transfers Overall transfer level: Needs assistance Equipment used: Rolling walker (2 wheeled) Transfers: Sit to/from [facility] Sit to Stand: Min assist Stand pivot transfers: Min assist          Ambulation/Gait Ambulation/Gait assistance: Min assist Ambulation Distance (Feet): 15 Feet Assistive device: Rolling walker (2 wheeled) Gait Pattern/deviations: Decreased step length - left;Decreased stance time - left;Decreased stride length   Gait velocity interpretation: Below normal speed for age/gender General Gait Details: demonstrates slow labored cadence without loss of balance, mostly limited due to nausea/vomiting  Stairs            Wheelchair  Mobility    Modified Rankin (Stroke Patients Only)       Balance Overall balance assessment: Needs assistance Sitting-balance support: Feet supported;No upper extremity supported Sitting balance-Leahy Scale: Good     Standing balance support: Bilateral upper extremity supported;During functional activity Standing balance-Leahy Scale: Fair                               Pertinent Vitals/Pain Pain Assessment: 0-10 Pain Score: 4  Pain Descriptors / Indicators: Aching;Sharp Pain Intervention(s): Limited activity within patient's tolerance;Monitored during session;Premedicated before session    Home Living Family/patient expects to be discharged to:: Private residence Living Arrangements: Spouse/significant other Available Help at Discharge: Family Type of Home: House Home Access: [place]: One Long Neck: Environmental consultant - 2 wheels;Cane - single point;Shower seat;Bedside commode      Prior Function Level of Independence: Independent with assistive device(s)               Hand Dominance        Extremity/Trunk Assessment   Upper Extremity Assessment Upper Extremity Assessment: Defer to OT evaluation    Lower Extremity Assessment Lower Extremity Assessment: Generalized weakness;RLE deficits/detail;LLE deficits/detail RLE Deficits / Details: grossly 5/5 LLE Deficits / Details: grossly 3/5    Cervical / Trunk Assessment Cervical / Trunk Assessment: Normal  Communication   Communication: No difficulties  Cognition Arousal/Alertness: Awake/alert Behavior During Therapy: WFL for tasks assessed/performed Overall Cognitive Status: Within Functional Limits for tasks assessed  General Comments      Exercises Total Joint Exercises Ankle Circles/Pumps: Supine;AROM;Strengthening;Both;5 reps Quad Sets: Supine;AROM;Strengthening;Both;5 reps Gluteal Sets:  Supine;AROM;Strengthening;Both;5 reps Short Arc Quad: Supine;AROM;Strengthening;Left;5 reps Heel Slides: Supine;AROM;Strengthening;Left;5 reps Goniometric ROM: LEFT KNEE PROM: 0-85 degrees   Assessment/Plan    PT Assessment Patient needs continued PT services  PT Problem List Decreased strength;Decreased range of motion;Decreased activity tolerance;Decreased mobility;Decreased balance       PT Treatment Interventions Gait training;Functional mobility training;Therapeutic activities;Therapeutic exercise;Patient/family education    PT Goals (Current goals can be found in the Care Plan section)  Acute Rehab PT Goals Patient Stated Goal: return home PT Goal Formulation: With patient Time For Goal Achievement: 01/04/17 Potential to Achieve Goals: Good    Frequency 7X/week   Barriers to discharge        Co-evaluation               AM-PAC PT "6 Clicks" Daily Activity  Outcome Measure Difficulty turning over in bed (including adjusting bedclothes, sheets and blankets)?: A Little Difficulty moving from lying on back to sitting on the side of the bed? : A Little Difficulty sitting down on and standing up from a chair with arms (e.g., wheelchair, bedside commode, etc,.)?: A Little Help needed moving to and from a bed to chair (including a wheelchair)?: A Little Help needed walking in hospital room?: A Little Help needed climbing 3-5 steps with a railing? : A Lot 6 Click Score: 17    End of Session   Activity Tolerance: Patient tolerated treatment well(Patient limited secondary to nausea/vomiting) Patient left: in bed;with call bell/phone within reach Nurse Communication: Mobility status PT Visit Diagnosis: Unsteadiness on feet (R26.81);Other abnormalities of gait and mobility (R26.89);Muscle weakness (generalized) (M62.81)    Time: 9892-1194 PT Time Calculation (min) (ACUTE ONLY): 29 min   Charges:   PT Evaluation $PT Eval Moderate Complexity: 1 Mod PT  Treatments $Therapeutic Activity: 23-37 mins   PT G Codes:   PT G-Codes **NOT FOR INPATIENT CLASS** Functional Assessment Tool Used: AM-PAC 6 Clicks Basic Mobility Functional Limitation: Mobility: Walking and moving around Mobility: Walking and Moving Around Current Status (R7408): At least 40 percent but less than 60 percent impaired, limited or restricted Mobility: Walking and Moving Around Goal Status [phone]): At least 40 percent but less than 60 percent impaired, limited or restricted Mobility: Walking and Moving Around Discharge Status [phone]): At least 40 percent but less than 60 percent impaired, limited or restricted    3:13 PM, 12/31/16 Lonell Grandchild, MPT Physical Therapist with [facility] [phone] office [phone] mobile phone

## 2016-12-31 NOTE — Brief Op Note (Signed)
12/31/2016  9:55 AM  PATIENT:  Rhonda Briggs  66 y.o. female  PRE-OPERATIVE DIAGNOSIS:  PRIMARY OSTEOARTHRITIS  LEFT KNEE  POST-OPERATIVE DIAGNOSIS:  PRIMARY OSTEOARTHRITIS  LEFT KNEE  PROCEDURE:  Procedure(s): TOTAL KNEE ARTHROPLASTY (Left)   DEPUY: 2.40F  2.0T  10 PS POLY   NO PATELLA   SURGEON:  Surgeon(s) and Role:    * Carole Civil, MD - Primary  PHYSICIAN ASSISTANT:   ASSISTANTS: CYNTHIA WRENN    ANESTHESIA:   spinal and general  EBL:  25 mL   BLOOD ADMINISTERED:none  DRAINS: 1 HEMOVAC    LOCAL MEDICATIONS USED:  MARCAINE W EPI   , Amount: 30  ml and OTHER EXPAREL 20 DILUTED WITH 40 CC SALINE    SPECIMEN:  No Specimen  DISPOSITION OF SPECIMEN:  N/A  COUNTS:  YES  TOURNIQUET:   Total Tourniquet Time Documented: Thigh (Left) - 81 minutes Total: Thigh (Left) - 81 minutes   DICTATION: .Viviann Spare Dictation  PLAN OF CARE: Admit to inpatient   PATIENT DISPOSITION:  PACU - hemodynamically stable.   Delay start of Pharmacological VTE agent (>24hrs) due to surgical blood loss or risk of bleeding: yes  [phone]

## 2017-01-01 ENCOUNTER — Encounter (HOSPITAL_COMMUNITY): Payer: Self-pay | Admitting: Orthopedic Surgery

## 2017-01-01 LAB — BASIC METABOLIC PANEL
Anion gap: 7 (ref 5–15)
BUN: 19 mg/dL (ref 6–20)
CHLORIDE: 102 mmol/L (ref 101–111)
CO2: 24 mmol/L (ref 22–32)
Calcium: 7.9 mg/dL — ABNORMAL LOW (ref 8.9–10.3)
Creatinine, Ser: 1.09 mg/dL — ABNORMAL HIGH (ref 0.44–1.00)
GFR calc Af Amer: 60 mL/min — ABNORMAL LOW (ref >60)
GFR calc non Af Amer: 52 mL/min — ABNORMAL LOW (ref >60)
GLUCOSE: 99 mg/dL (ref 65–99)
POTASSIUM: 3.8 mmol/L (ref 3.5–5.1)
Sodium: 133 mmol/L — ABNORMAL LOW (ref 135–145)

## 2017-01-01 LAB — CBC
HEMATOCRIT: 28.3 % — AB (ref 36.0–46.0)
HEMOGLOBIN: 8.5 g/dL — AB (ref 12.0–15.0)
MCH: 23.5 pg — AB (ref 26.0–34.0)
MCHC: 30 g/dL (ref 30.0–36.0)
MCV: 78.4 fL (ref 78.0–100.0)
Platelets: 452 10*3/uL — ABNORMAL HIGH (ref 150–400)
RBC: 3.61 MIL/uL — AB (ref 3.87–5.11)
RDW: 15.2 % (ref 11.5–15.5)
WBC: 11.1 10*3/uL — ABNORMAL HIGH (ref 4.0–10.5)

## 2017-01-01 MED ORDER — HYDROCODONE-ACETAMINOPHEN 10-325 MG PO TABS
1.0000 | ORAL_TABLET | ORAL | Status: DC | PRN
  Administered 2017-01-02 – 2017-01-05 (×2): 1 via ORAL
  Filled 2017-01-01 (×2): qty 1

## 2017-01-01 NOTE — Progress Notes (Signed)
Physical Therapy Treatment Patient Details Name: Rhonda Briggs MRN: [id number] DOB: 1950/11/20 Today's Date: 01/01/2017    History of Present Illness Rhonda Briggs is a 66 y/o female s/p Left TKA 12/31/16, with the diagnosis of PRIMARY OSTEOARTHRITIS KNEE      PT Comments    Patient demonstrates increased endurance for gait training with fair/good return for left heel to toe stepping, slightly increased left knee flexion while performing ROM exercises and declined to sit up in chair secondary to fatigue and having sat up earlier with OT assisting.  Patient will benefit from continued physical therapy in hospital and recommended venue below to increase strength, balance, endurance for safe ADLs and gait.    Follow Up Recommendations  Home health PT;Supervision for mobility/OOB     Equipment Recommendations  None recommended by PT    Recommendations for Other Services       Precautions / Restrictions Precautions Precautions: Fall Precaution Comments: due to recent surgery Restrictions Weight Bearing Restrictions: Yes LLE Weight Bearing: Weight bearing as tolerated Other Position/Activity Restrictions: no pillows underneath left knee    Mobility  Bed Mobility Overal bed mobility: Modified Independent Bed Mobility: Supine to Sit     Supine to sit: Modified independent (Device/Increase time)     General bed mobility comments: demonstrates slightly labored movement, able to move LLE without assist  Transfers Overall transfer level: Needs assistance Equipment used: Rolling walker (2 wheeled) Transfers: Sit to/from [facility] Sit to Stand: Min guard Stand pivot transfers: Min guard          Ambulation/Gait Ambulation/Gait assistance: Min guard Ambulation Distance (Feet): 55 Feet Assistive device: Rolling walker (2 wheeled) Gait Pattern/deviations: Decreased step length - left;Decreased stance time - left;Decreased stride length   Gait velocity  interpretation: Below normal speed for age/gender General Gait Details: demonstrates slightly labord cadence with fair/good return for left heel to toe stepping, no loss of balance, limited secondary to c/o fatigue.   Stairs            Wheelchair Mobility    Modified Rankin (Stroke Patients Only)       Balance Overall balance assessment: Needs assistance Sitting-balance support: No upper extremity supported;Feet supported Sitting balance-Leahy Scale: Good     Standing balance support: Bilateral upper extremity supported;During functional activity Standing balance-Leahy Scale: Fair                              Cognition Arousal/Alertness: Awake/alert Behavior During Therapy: WFL for tasks assessed/performed Overall Cognitive Status: Within Functional Limits for tasks assessed                                        Exercises Total Joint Exercises Ankle Circles/Pumps: Supine;AROM;Strengthening;Both;10 reps Quad Sets: Supine;AROM;Strengthening;Left;10 reps Short Arc Quad: Supine;AROM;Strengthening;Left;10 reps Heel Slides: Supine;AROM;Strengthening;Left;10 reps Knee Flexion: Seated;PROM(self stretching left knee into flexion using RLE)    General Comments        Pertinent Vitals/Pain Pain Assessment: 0-10 Pain Score: 4  Pain Location: left knee Pain Descriptors / Indicators: Aching;[place]                      Prior Function            PT Goals (current goals can now be found in the care plan section) Acute Rehab  PT Goals Patient Stated Goal: return home PT Goal Formulation: With patient Time For Goal Achievement: 01/04/17 Potential to Achieve Goals: Good Progress towards PT goals: Progressing toward goals    Frequency           PT Plan Current plan remains appropriate    Co-evaluation              AM-PAC PT "6 Clicks" Daily Activity  Outcome Measure  Difficulty turning over in bed  (including adjusting bedclothes, sheets and blankets)?: None Difficulty moving from lying on back to sitting on the side of the bed? : None Difficulty sitting down on and standing up from a chair with arms (e.g., wheelchair, bedside commode, etc,.)?: A Little Help needed moving to and from a bed to chair (including a wheelchair)?: A Little Help needed walking in hospital room?: A Little Help needed climbing 3-5 steps with a railing? : A Little 6 Click Score: 20    End of Session   Activity Tolerance: Patient tolerated treatment well;Patient limited by fatigue Patient left: in bed;with call bell/phone within reach Nurse Communication: Mobility status PT Visit Diagnosis: Unsteadiness on feet (R26.81);Other abnormalities of gait and mobility (R26.89);Muscle weakness (generalized) (M62.81)     Time: 1610-9604 PT Time Calculation (min) (ACUTE ONLY): 28 min  Charges:  $Gait Training: 8-22 mins $Therapeutic Exercise: 8-22 mins                    G Codes:  Functional Assessment Tool Used: AM-PAC 6 Clicks Basic Mobility Functional Limitation: Mobility: Walking and moving around Mobility: Walking and Moving Around Current Status (V4098): At least 20 percent but less than 40 percent impaired, limited or restricted Mobility: Walking and Moving Around Goal Status [phone]): At least 20 percent but less than 40 percent impaired, limited or restricted Mobility: Walking and Moving Around Discharge Status [phone]): At least 20 percent but less than 40 percent impaired, limited or restricted    2:37 PM, 01/01/17 Lonell Grandchild, MPT Physical Therapist with [facility] [phone] office [phone] mobile phone

## 2017-01-01 NOTE — Anesthesia Postprocedure Evaluation (Signed)
Anesthesia Post Note  Patient: Rhonda Briggs  Procedure(s) Performed: TOTAL KNEE ARTHROPLASTY (Left Knee)  Patient location during evaluation: Nursing Unit Anesthesia Type: Spinal Level of consciousness: awake and alert Pain management: satisfactory to patient Vital Signs Assessment: post-procedure vital signs reviewed and stable Respiratory status: spontaneous breathing Cardiovascular status: stable : nausea resolving with medication and fluid  Anesthetic complications: no     Last Vitals:  Vitals:   01/01/17 0400 01/01/17 0800  BP: (!) 96/48 (!) 123/49  Pulse: 82 68  Resp: 16 18  Temp: 37.5 C 37.1 C  SpO2: 99% 100%    Last Pain:  Vitals:   01/01/17 0800  TempSrc: Oral  PainSc:                  Drucie Opitz

## 2017-01-01 NOTE — Evaluation (Signed)
Occupational Therapy Evaluation Patient Details Name: Rhonda Briggs MRN: [id number] DOB: 28-Jan-1950 Today's Date: 01/01/2017    History of Present Illness Rhonda Briggs is a 66 y/o female s/p Left TKA 12/31/16, with the diagnosis of PRIMARY OSTEOARTHRITIS KNEE     Clinical Impression   Pt in bed upon therapy arrival and agreeable to participate in OT evaluation. Patient will have assistance from husband when discharged. Pt was educated on use of adaptive equipment that may increase her functional performance and allow her to complete tasks with increased safety. Patient would benefit from [facility] OT initially to assess safety in home.     Follow Up Recommendations  Home health OT    Equipment Recommendations  None recommended by OT       Precautions / Restrictions Precautions Precautions: Fall Precaution Comments: due to recent surgery Restrictions LLE Weight Bearing: Weight bearing as tolerated Other Position/Activity Restrictions: no pillows underneath left knee      Mobility Bed Mobility Overal bed mobility: Modified Independent Bed Mobility: Supine to Sit     Supine to sit: Modified independent (Device/Increase time)        Transfers Overall transfer level: Needs assistance Equipment used: Rolling walker (2 wheeled) Transfers: Sit to/from Stand Sit to Stand: Min assist         General transfer comment: Pt required constant verbal cues for safety and technique tranfering with walker. pt moves very quickly.        ADL either performed or assessed with clinical judgement   ADL Overall ADL's : Needs assistance/impaired                     Lower Body Dressing: Total assistance;Bed level Lower Body Dressing Details (indicate cue type and reason): donning hospital socks Toilet Transfer: Minimal assistance;RW Toilet Transfer Details (indicate cue type and reason): to recliner                 Vision Baseline Vision/History: No visual  deficits Patient Visual Report: No change from baseline              Pertinent Vitals/Pain Pain Assessment: 0-10 Pain Score: 4  Pain Location: left knee Pain Descriptors / Indicators: Aching;Sharp Pain Intervention(s): Ice applied;Repositioned;Patient requesting pain meds-RN notified     Hand Dominance Right   Extremity/Trunk Assessment Upper Extremity Assessment Upper Extremity Assessment: Overall WFL for tasks assessed   Lower Extremity Assessment Lower Extremity Assessment: Defer to PT evaluation       Communication Communication Communication: No difficulties   Cognition Arousal/Alertness: Awake/alert Behavior During Therapy: WFL for tasks assessed/performed Overall Cognitive Status: Within Functional Limits for tasks assessed                     Home Living Family/patient expects to be discharged to:: Private residence Living Arrangements: Spouse/significant other Available Help at Discharge: Family Type of Home: House Home Access: Ramped entrance     Home Layout: One level     Bathroom Shower/Tub: Teacher, early years/pre: Standard Bathroom Accessibility: Yes How Accessible: Accessible via walker Home Equipment: Walker - 2 wheels;Cane - single point;Shower seat;Bedside commode;Adaptive equipment Adaptive Equipment: [place]acher        Prior Functioning/Environment Level of Independence: Independent with assistive device(s)  AM-PAC PT "6 Clicks" Daily Activity     Outcome Measure Help from another person eating meals?: None Help from another person taking care of personal grooming?: None Help from another person toileting, which includes using toliet, bedpan, or urinal?: A Little Help from another person bathing (including washing, rinsing, drying)?: A Little Help from another person to put on and taking off regular upper body clothing?: None Help from another person to put on and  taking off regular lower body clothing?: A Lot 6 Click Score: 20   End of Session Equipment Utilized During Treatment: Gait belt;Rolling walker Nurse Communication: Patient requests pain meds  Activity Tolerance: Patient tolerated treatment well Patient left: in chair  OT Visit Diagnosis: Muscle weakness (generalized) (M62.81)                Time: 5465-0354 OT Time Calculation (min): 25 min Charges:  OT General Charges $OT Visit: 1 Visit OT Evaluation $OT Eval Low Complexity: 1 Low G-Codes: OT G-codes **NOT FOR INPATIENT CLASS** Functional Assessment Tool Used: AM-PAC 6 Clicks Daily Activity Functional Limitation: Self care Self Care Current Status (S5681): At least 20 percent but less than 40 percent impaired, limited or restricted Self Care Goal Status (E7517): At least 20 percent but less than 40 percent impaired, limited or restricted Self Care Discharge Status [phone]): At least 20 percent but less than 40 percent impaired, limited or restricted   Ailene Ravel, OTR/L,CBIS  [phone]   Rhonda Briggs, Clarene Duke 01/01/2017, 9:52 AM

## 2017-01-01 NOTE — Addendum Note (Signed)
Addendum  created 01/01/17 3735 by Vista Deck, CRNA   Sign clinical note

## 2017-01-01 NOTE — Care Management Important Message (Signed)
Important Message  Patient Details  Name: Rhonda Briggs MRN: [id number] Date of Birth: 12-Jan-1951   Medicare Important Message Given:  Yes    Sherald Barge, RN 01/01/2017, 2:21 PM

## 2017-01-01 NOTE — Progress Notes (Signed)
Patient ID: Rhonda Briggs, female   DOB: [birth year], 66 y.o.   MRN: [id number]   Postop day 1 status post left total knee arthroplasty  Patient complained of nausea which has improved with medication.  She did have some somnolence after 1 of the medications last night so we will adjust that  BP (!) 96/48 (BP Location: Right Arm)   Pulse 82   Temp 99.5 F (37.5 C) (Oral)   Resp 16   Ht 5\' 1"  (1.549 m)   Wt 136 lb (61.7 kg)   SpO2 99%   BMI 25.70 kg/m  CBC Latest Ref Rng & Units 01/01/2017 12/28/2016 08/21/2016  WBC 4.0 - 10.5 K/uL 11.1(H) 10.1 13.5(H)  Hemoglobin 12.0 - 15.0 g/dL 8.5(L) 11.5(L) 8.8(L)  Hematocrit 36.0 - 46.0 % 28.3(L) 36.8 27.8(L)  Platelets 150 - 400 K/uL 452(H) 588(H) 518(H)    BMP Latest Ref Rng & Units 01/01/2017 12/28/2016 08/20/2016  Glucose 65 - 99 mg/dL 99 96 97  BUN 6 - 20 mg/dL 19 26(H) 15  Creatinine 0.44 - 1.00 mg/dL 1.09(H) 1.19(H) 0.95  Sodium 135 - 145 mmol/L 133(L) 137 134(L)  Potassium 3.5 - 5.1 mmol/L 3.8 4.5 3.9  Chloride 101 - 111 mmol/L 102 99(L) 100(L)  CO2 22 - 32 mmol/L 24 26 27   Calcium 8.9 - 10.3 mg/dL 7.9(L) 9.7 8.1(L)    As noted last visit she did become anemic after surgery.  She will continue with iron fluids as needed.  Transfusion if necessary after 2 L of fluid or if hemoglobin drops below 8.

## 2017-01-01 NOTE — Clinical Social Work Note (Signed)
Patient referred to CSW for SNF. Patient is going with with CM arranging [place] services.  LCSW signing off.     Taquanna Borras, Clydene Pugh, LCSW

## 2017-01-01 NOTE — Care Management Note (Addendum)
Case Management Note  Patient Details  Name: Rhonda Briggs MRN: [id number] Date of Birth: 1950-08-24              S/p TKA. Pt from home, lives with  Her husband. Pt is ind with ADL's PTA. She has had previous TKA this year. She has all necessary DME. [employer] has received referral from MD office for [facility] and they will deliver to pt home day of DC. PT referral sent to [facility], pt out of their service area, per pt request CM has contacted [facility], they are unable to provide services at this time. Pt's next choice is [facility], previous provider. CM has given referral for Tresea Mall, [facility] rep, who will pull pt info from chart. Bedside RN will notify [facility] agency once pt discharges.          Expected Discharge Date:  01/02/17               Expected Discharge Plan:  [place]  In-House Referral:  NA  Discharge planning Services     Post Acute Care Choice:  Home Health Choice offered to:  Patient  Status of Service:  Completed, signed off  Sherald Barge, RN 01/01/2017, 2:22 PM

## 2017-01-02 LAB — CBC
HEMATOCRIT: 25.8 % — AB (ref 36.0–46.0)
Hemoglobin: 8 g/dL — ABNORMAL LOW (ref 12.0–15.0)
MCH: 23.8 pg — ABNORMAL LOW (ref 26.0–34.0)
MCHC: 31 g/dL (ref 30.0–36.0)
MCV: 76.8 fL — ABNORMAL LOW (ref 78.0–100.0)
Platelets: 437 10*3/uL — ABNORMAL HIGH (ref 150–400)
RBC: 3.36 MIL/uL — ABNORMAL LOW (ref 3.87–5.11)
RDW: 15.1 % (ref 11.5–15.5)
WBC: 13.3 10*3/uL — ABNORMAL HIGH (ref 4.0–10.5)

## 2017-01-02 NOTE — Progress Notes (Signed)
Patient ID: Rhonda Briggs, female   DOB: 1950-09-03, 66 y.o.   MRN: [id number] BP (!) 144/53 (BP Location: Right Arm)   Pulse 95   Temp 98.5 F (36.9 C) (Oral)   Resp 16   Ht 5\' 1"  (1.549 m)   Wt 136 lb (61.7 kg)   SpO2 97%   BMI 25.70 kg/m   CBC Latest Ref Rng & Units 01/02/2017 01/01/2017 12/28/2016  WBC 4.0 - 10.5 K/uL 13.3(H) 11.1(H) 10.1  Hemoglobin 12.0 - 15.0 g/dL 8.0(L) 8.5(L) 11.5(L)  Hematocrit 36.0 - 46.0 % 25.8(L) 28.3(L) 36.8  Platelets 150 - 400 K/uL 437(H) 452(H) 588(H)   BMP Latest Ref Rng & Units 01/01/2017 12/28/2016 08/20/2016  Glucose 65 - 99 mg/dL 99 96 97  BUN 6 - 20 mg/dL 19 26(H) 15  Creatinine 0.44 - 1.00 mg/dL 1.09(H) 1.19(H) 0.95  Sodium 135 - 145 mmol/L 133(L) 137 134(L)  Potassium 3.5 - 5.1 mmol/L 3.8 4.5 3.9  Chloride 101 - 111 mmol/L 102 99(L) 100(L)  CO2 22 - 32 mmol/L 24 26 27   Calcium 8.9 - 10.3 mg/dL 7.9(L) 9.7 8.1(L)    CONTINUE PT   REMOVED THE DRAIN

## 2017-01-02 NOTE — Progress Notes (Signed)
PT Cancellation Note  Patient Details Name: Rhonda Briggs MRN: [id number] DOB: 04-22-1950   Cancelled Treatment:    Reason Eval/Treat Not Completed: Other (comment).  Pt had just been assisted back into the CPM and declined therapy, stating she would just use [facility] for therapy substitute.  Will see again tomorrow as pt allows.   Ramond Dial 01/02/2017, 3:00 PM   Mee Hives, PT MS Acute Rehab Dept. Number: [place] and [place]

## 2017-01-03 LAB — CBC
HEMATOCRIT: 26.5 % — AB (ref 36.0–46.0)
Hemoglobin: 8.3 g/dL — ABNORMAL LOW (ref 12.0–15.0)
MCH: 24.2 pg — ABNORMAL LOW (ref 26.0–34.0)
MCHC: 31.3 g/dL (ref 30.0–36.0)
MCV: 77.3 fL — AB (ref 78.0–100.0)
Platelets: 483 10*3/uL — ABNORMAL HIGH (ref 150–400)
RBC: 3.43 MIL/uL — ABNORMAL LOW (ref 3.87–5.11)
RDW: 15.3 % (ref 11.5–15.5)
WBC: 12 10*3/uL — ABNORMAL HIGH (ref 4.0–10.5)

## 2017-01-03 NOTE — Progress Notes (Signed)
Physical Therapy Treatment Patient Details Name: Rhonda Briggs MRN: [id number] DOB: 1950-11-20 Today's Date: 01/03/2017    History of Present Illness Rhonda Briggs is a 66 y/o female s/p Left TKA 12/31/16, with the diagnosis of PRIMARY OSTEOARTHRITIS KNEE      PT Comments    Patient demonstrates increased endurance for gait training with fair/good return for left heel to toe stepping after verbal cues, mostly limited due to increasing right knee pain and tolerated sitting up in chair to eat lunch after therapy.  Patient will benefit from continued physical therapy in hospital and recommended venue below to increase strength, balance, endurance for safe ADLs and gait.    Follow Up Recommendations  Home health PT;Supervision for mobility/OOB     Equipment Recommendations  None recommended by PT    Recommendations for Other Services       Precautions / Restrictions Precautions Precautions: Fall Precaution Comments: due to recent surgery Restrictions Weight Bearing Restrictions: Yes LUE Weight Bearing: Weight bearing as tolerated LLE Weight Bearing: Weight bearing as tolerated    Mobility  Bed Mobility Overal bed mobility: Independent Bed Mobility: Supine to Sit              Transfers Overall transfer level: Needs assistance Equipment used: Rolling walker (2 wheeled) Transfers: Sit to/from [facility] Sit to Stand: Supervision Stand pivot transfers: Supervision       General transfer comment: requires VC's for proper hand placement during sit to stands  Ambulation/Gait Ambulation/Gait assistance: Supervision Ambulation Distance (Feet): 100 Feet Assistive device: Rolling walker (2 wheeled) Gait Pattern/deviations: Decreased step length - left;Decreased stance time - left;Decreased stride length   Gait velocity interpretation: Below normal speed for age/gender General Gait Details: demonstrates slightly labored slow cadence with VC's to attempt  left heel to toe stepping with fair/good carryover, no loss of balance, limited mostly due to right knee pain   Stairs            Wheelchair Mobility    Modified Rankin (Stroke Patients Only)       Balance Overall balance assessment: Needs assistance Sitting-balance support: No upper extremity supported;Feet supported Sitting balance-Leahy Scale: Good     Standing balance support: Bilateral upper extremity supported;During functional activity Standing balance-Leahy Scale: Fair Standing balance comment: fair/good with RW                            Cognition Arousal/Alertness: Awake/alert Behavior During Therapy: WFL for tasks assessed/performed Overall Cognitive Status: Within Functional Limits for tasks assessed                                        Exercises Total Joint Exercises Ankle Circles/Pumps: Supine;AROM;Strengthening;Both;10 reps Quad Sets: Supine;AROM;Strengthening;Left;10 reps Short Arc Quad: Supine;AROM;Strengthening;Left;10 reps Heel Slides: Supine;AROM;Strengthening;Left;10 reps    General Comments        Pertinent Vitals/Pain Pain Score: 8  Pain Location: left knee after walking, none at rest Pain Descriptors / Indicators: Aching;Sharp Pain Intervention(s): Limited activity within patient's tolerance;Monitored during session    Home Living                      Prior Function            PT Goals (current goals can now be found in the care plan section) Acute Rehab PT Goals Patient Stated Goal:  return home PT Goal Formulation: With patient Time For Goal Achievement: 01/04/17 Potential to Achieve Goals: Good Progress towards PT goals: Progressing toward goals    Frequency    7X/week      PT Plan Current plan remains appropriate    Co-evaluation              AM-PAC PT "6 Clicks" Daily Activity  Outcome Measure  Difficulty turning over in bed (including adjusting bedclothes, sheets  and blankets)?: None Difficulty moving from lying on back to sitting on the side of the bed? : None Difficulty sitting down on and standing up from a chair with arms (e.g., wheelchair, bedside commode, etc,.)?: A Little Help needed moving to and from a bed to chair (including a wheelchair)?: A Little Help needed walking in hospital room?: A Little Help needed climbing 3-5 steps with a railing? : A Little 6 Click Score: 20    End of Session   Activity Tolerance: Patient tolerated treatment well;Patient limited by pain Patient left: in chair;with call bell/phone within reach;with family/visitor present Nurse Communication: Mobility status PT Visit Diagnosis: Unsteadiness on feet (R26.81);Other abnormalities of gait and mobility (R26.89);Muscle weakness (generalized) (M62.81)     Time: 0981-1914 PT Time Calculation (min) (ACUTE ONLY): 26 min  Charges:  $Gait Training: 8-22 mins $Therapeutic Exercise: 8-22 mins                    G Codes:       12:05 PM, 2017-01-12 Rhonda Briggs, MPT Physical Therapist with [facility] [phone] office [phone] mobile phone

## 2017-01-03 NOTE — Progress Notes (Signed)
Patient ID: Rhonda Briggs, female   DOB: [birth year], 66 y.o.   MRN: [id number] BP (!) 133/54 (BP Location: Right Arm)   Pulse 92   Temp 98.5 F (36.9 C) (Oral)   Resp 17   Ht 5\' 1"  (1.549 m)   Wt 136 lb (61.7 kg)   SpO2 96%   BMI 25.70 kg/m   (SNOW STORM)   CBC Latest Ref Rng & Units 01/03/2017 01/02/2017 01/01/2017  WBC 4.0 - 10.5 K/uL 12.0(H) 13.3(H) 11.1(H)  Hemoglobin 12.0 - 15.0 g/dL 8.3(L) 8.0(L) 8.5(L)  Hematocrit 36.0 - 46.0 % 26.5(L) 25.8(L) 28.3(L)  Platelets 150 - 400 K/uL 483(H) 437(H) 452(H)   BMP Latest Ref Rng & Units 01/01/2017 12/28/2016 08/20/2016  Glucose 65 - 99 mg/dL 99 96 97  BUN 6 - 20 mg/dL 19 26(H) 15  Creatinine 0.44 - 1.00 mg/dL 1.09(H) 1.19(H) 0.95  Sodium 135 - 145 mmol/L 133(L) 137 134(L)  Potassium 3.5 - 5.1 mmol/L 3.8 4.5 3.9  Chloride 101 - 111 mmol/L 102 99(L) 100(L)  CO2 22 - 32 mmol/L 24 26 27   Calcium 8.9 - 10.3 mg/dL 7.9(L) 9.7 8.1(L)   HG TENDS TO RUN LOW CONTINUE FE. PT IF AVAILABLE   CPM CONTINUE

## 2017-01-04 NOTE — Progress Notes (Signed)
Physical Therapy Treatment Patient Details Name: Rhonda Briggs MRN: [id number] DOB: February 02, 1950 Today's Date: 01/04/2017    History of Present Illness Rhonda Briggs is a 66 y/o female s/p Left TKA 12/31/16, with the diagnosis of PRIMARY OSTEOARTHRITIS KNEE      PT Comments    Patient demonstrates increased left knee active/passive ROM and strength for gait training and completing exercises.  Patient will benefit from continued physical therapy in hospital and recommended venue below to increase strength, balance, endurance for safe ADLs and gait.    Follow Up Recommendations  Home health PT;Supervision for mobility/OOB     Equipment Recommendations  None recommended by PT    Recommendations for Other Services       Precautions / Restrictions Precautions Precautions: Fall Precaution Comments: due to recent surgery Restrictions Weight Bearing Restrictions: Yes LLE Weight Bearing: Weight bearing as tolerated    Mobility  Bed Mobility Overal bed mobility: Independent                Transfers Overall transfer level: Needs assistance Equipment used: Rolling walker (2 wheeled) Transfers: Sit to/from [employer] Transfers Sit to Stand: Modified independent (Device/Increase time) Stand pivot transfers: Modified independent (Device/Increase time)       General transfer comment: good return for transferring to [facility] in bathroom and to chair  Ambulation/Gait Ambulation/Gait assistance: Supervision Ambulation Distance (Feet): 120 Feet Assistive device: Rolling walker (2 wheeled) Gait Pattern/deviations: Step-through pattern;Decreased step length - left;Decreased stance time - left;Decreased stride length   Gait velocity interpretation: Below normal speed for age/gender General Gait Details: slightly labored slow cadence with good return for left heel to toe stepping, no loss of balance   Stairs            Wheelchair Mobility    Modified Rankin (Stroke  Patients Only)       Balance Overall balance assessment: Needs assistance Sitting-balance support: No upper extremity supported;Feet supported Sitting balance-Leahy Scale: Good     Standing balance support: Bilateral upper extremity supported;During functional activity Standing balance-Leahy Scale: Good                              Cognition Arousal/Alertness: Awake/alert Behavior During Therapy: WFL for tasks assessed/performed Overall Cognitive Status: Within Functional Limits for tasks assessed                                        Exercises Total Joint Exercises Ankle Circles/Pumps: Supine;AROM;Strengthening;Both;10 reps Quad Sets: Supine;AROM;Strengthening;Left;10 reps Short Arc Quad: Supine;AROM;Strengthening;Left;10 reps Heel Slides: Supine;AROM;Strengthening;Left;10 reps Goniometric ROM: LEFT KNEE PROM: 0-98 DEGREES    General Comments        Pertinent Vitals/Pain Pain Assessment: 0-10 Pain Score: 3  Pain Location: left knee  Pain Descriptors / Indicators: Aching;Discomfort Pain Intervention(s): Limited activity within patient's tolerance;Monitored during session    Home Living                      Prior Function            PT Goals (current goals can now be found in the care plan section) Acute Rehab PT Goals Patient Stated Goal: return home PT Goal Formulation: With patient Time For Goal Achievement: 01/06/17 Potential to Achieve Goals: Good Progress towards PT goals: Progressing toward goals    Frequency    7X/week  PT Plan Current plan remains appropriate    Co-evaluation              AM-PAC PT "6 Clicks" Daily Activity  Outcome Measure  Difficulty turning over in bed (including adjusting bedclothes, sheets and blankets)?: None Difficulty moving from lying on back to sitting on the side of the bed? : None Difficulty sitting down on and standing up from a chair with arms (e.g.,  wheelchair, bedside commode, etc,.)?: None Help needed moving to and from a bed to chair (including a wheelchair)?: A Little Help needed walking in hospital room?: A Little Help needed climbing 3-5 steps with a railing? : A Little 6 Click Score: 21    End of Session   Activity Tolerance: Patient tolerated treatment well;Patient limited by fatigue Patient left: in chair;with call bell/phone within reach Nurse Communication: Mobility status PT Visit Diagnosis: Unsteadiness on feet (R26.81);Other abnormalities of gait and mobility (R26.89);Muscle weakness (generalized) (M62.81)     Time: 6948-5462 PT Time Calculation (min) (ACUTE ONLY): 21 min  Charges:  $Therapeutic Activity: 8-22 mins                    G Codes:       2:03 PM, 01-30-2017 Lonell Grandchild, MPT Physical Therapist with [facility] [phone] office [phone] mobile phone

## 2017-01-04 NOTE — Progress Notes (Signed)
Patient ID: Rhonda Briggs, female   DOB: Jul 05, 1950, 66 y.o.   MRN: [id number]  BP (!) 117/46 (BP Location: Right Arm)   Pulse 80   Temp 98.2 F (36.8 C) (Oral)   Resp 18   Ht 5\' 1"  (1.549 m)   Wt 136 lb (61.7 kg)   SpO2 91%   BMI 25.70 kg/m   She doesn't have a ride home   So she'll have to stay in house

## 2017-01-05 ENCOUNTER — Encounter (HOSPITAL_COMMUNITY): Payer: Self-pay | Admitting: Orthopedic Surgery

## 2017-01-05 MED ORDER — BISACODYL 5 MG PO TBEC: 5.0000 mg | DELAYED_RELEASE_TABLET | Freq: Every day | ORAL | 0 refills | Status: DC | PRN

## 2017-01-05 MED ORDER — HYDROCODONE-ACETAMINOPHEN 10-325 MG PO TABS: 1.0000 | ORAL_TABLET | ORAL | 0 refills | Status: DC | PRN

## 2017-01-05 MED ORDER — ASPIRIN 325 MG PO TBEC: 325.0000 mg | DELAYED_RELEASE_TABLET | Freq: Every day | ORAL | 0 refills | Status: DC

## 2017-01-05 MED ORDER — GABAPENTIN 100 MG PO CAPS: 200.0000 mg | ORAL_CAPSULE | Freq: Three times a day (TID) | ORAL | 0 refills | Status: DC

## 2017-01-05 MED ORDER — HYDROCODONE-ACETAMINOPHEN 10-325 MG PO TABS: 1.0000 | ORAL_TABLET | ORAL | Status: DC | PRN

## 2017-01-05 MED ORDER — FERROUS SULFATE 325 (65 FE) MG PO TABS: 325.0000 mg | ORAL_TABLET | Freq: Three times a day (TID) | ORAL | 0 refills | Status: DC

## 2017-01-05 NOTE — Discharge Summary (Signed)
Physician Discharge Summary  Patient ID: Rhonda Briggs MRN: [id number] DOB/AGE: Dec 04, 1950 66 y.o.  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 rate 20, height 5\' 1"  (1.549 m), weight 136 lb (61.7 kg), SpO2 91 %. NEURO VASCULAR EXAM INTACT    Disposition: 01-Home or Self Care  Discharge Instructions    CPM   Complete by:  As directed    Continuous passive motion machine (CPM):      Use the CPM from 0 to 90 for 6 hours per day.      You may increase by 10 per day.  You may break it up into 2 or 3 sessions per day.      Use CPM for 2 weeks or until you are told to stop.   Call MD / Call 911   Complete by:  As directed    If you experience chest pain or shortness of breath, CALL 911 and be transported to the hospital emergency room.  If you develope a fever above 101 F, pus (white drainage) or increased drainage or redness at the wound, or calf pain, call your surgeon's office.   Change dressing   Complete by:  As directed    Change dressing ONLY IF NEEDED   Constipation Prevention   Complete by:  As directed    Drink plenty of fluids.  Prune juice may be helpful.  You may use a stool softener, such as Colace (over the counter) 100 mg twice a day.  Use MiraLax (over the counter) for constipation as needed.   Diet - low sodium heart healthy   Complete by:  As directed    Do not put a pillow under the knee. Place it under the heel.   Complete by:  As directed    Increase activity slowly as tolerated   Complete by:  As directed    TED hose   Complete by:  As  directed    Use stockings (TED hose) for 2 weeks     Allergies as of 01/05/2017   No Known Allergies     Medication List    STOP taking these medications   acetaminophen 325 MG tablet Commonly known as:  TYLENOL   HYDROcodone-acetaminophen 5-325 MG tablet Commonly known as:  NORCO Replaced by:  HYDROcodone-acetaminophen 10-325 MG tablet   ibuprofen 200 MG tablet Commonly known as:  ADVIL,MOTRIN     TAKE these medications   aspirin 325 MG EC tablet Take 1 tablet (325 mg total) by mouth daily with breakfast.   bisacodyl 5 MG EC tablet Commonly known as:  DULCOLAX Take 1 tablet (5 mg total) by mouth daily as needed for moderate constipation.   celecoxib 200 MG capsule Commonly known as:  CELEBREX Take 1 capsule (200 mg total) by mouth daily. Take with food.   ferrous sulfate 325 (65 FE) MG tablet Commonly known as:  FEOSOL Take 1 tablet (325 mg total) by mouth 3 (three) times daily with meals. What changed:  when to take this   gabapentin  100 MG capsule Commonly known as:  NEURONTIN Take 2 capsules (200 mg total) by mouth 3 (three) times daily.   hydrochlorothiazide 25 MG tablet Commonly known as:  HYDRODIURIL Take 25 mg by mouth daily.   HYDROcodone-acetaminophen 10-325 MG tablet Commonly known as:  NORCO Take 1 tablet by mouth every 4 (four) hours as needed for severe pain (May give 10 mg if the 7.5 mg is not working). Replaces:  HYDROcodone-acetaminophen 5-325 MG tablet   pramipexole 0.125 MG tablet Commonly known as:  MIRAPEX Take 0.25 mg by mouth at bedtime.            Discharge Care Instructions  (From admission, onward)        Start     Ordered   01/05/17 0000  Change dressing    Comments:  Change dressing ONLY IF NEEDED   01/05/17 0934     Follow-up Information    [facility].   Contact information: [address] [place] [address] [phone]        Carole Civil, MD Follow up on  01/13/2017.   Specialties:  Orthopedic Surgery, Radiology Contact information: [address] [place] [address] [phone]           Signed: Arther Abbott 01/05/2017, 9:35 AM

## 2017-01-05 NOTE — Progress Notes (Signed)
Physical Therapy Treatment Patient Details Name: Rhonda Briggs MRN: [id number] DOB: 05/02/1950 Today's Date: 01/05/2017    History of Present Illness Rhonda Briggs is a 66 y/o female s/p Left TKA 12/31/16, with the diagnosis of PRIMARY OSTEOARTHRITIS KNEE      PT Comments    Pt supine in bed and willing to partciipate with therapy today.  No reports of pain currently.  Pt presents with improved independence with bed mobility and transfers with no assistance required, did cue for proper hand placement with STS for safety.  Increased distance with gait training with supervision and cueing to improve heel strike and equalize stance phase to improve gait mechanics.  Improved activity tolerance with no fatigue following gait training and no LOB episodes during session.  EOS pt left in chair with call bell within reach and CPM set for -5-->95 degrees.  Included time for removal on communication board for RN to remove.  No reports of pain through session.   Follow Up Recommendations        Equipment Recommendations       Recommendations for Other Services       Precautions / Restrictions Precautions Precautions: Fall Precaution Comments: due to recent surgery Restrictions Weight Bearing Restrictions: Yes LLE Weight Bearing: Weight bearing as tolerated Other Position/Activity Restrictions: no pillows underneath left knee    Mobility  Bed Mobility Overal bed mobility: Independent Bed Mobility: Supine to Sit     Supine to sit: Independent        Transfers Overall transfer level: Modified independent Equipment used: Rolling walker (2 wheeled) Transfers: Sit to/from Stand Sit to Stand: Supervision         General transfer comment: cueing for handplacement for safety with STS  Ambulation/Gait Ambulation/Gait assistance: Supervision Ambulation Distance (Feet): 135 Feet Assistive device: Rolling walker (2 wheeled) Gait Pattern/deviations: Step-through pattern;Decreased step  length - left;Decreased stance time - left;Decreased stride length     General Gait Details: slight cueing for heel strike and equal leg stance to improve mechanics, no labored breathing or LOB through session.     Stairs            Wheelchair Mobility    Modified Rankin (Stroke Patients Only)       Balance                                            Cognition Arousal/Alertness: Awake/alert Behavior During Therapy: WFL for tasks assessed/performed Overall Cognitive Status: Within Functional Limits for tasks assessed                                        Exercises Total Joint Exercises Ankle Circles/Pumps: Supine;AROM;Strengthening;Both;10 reps Quad Sets: Supine;AROM;Strengthening;Left;10 reps Gluteal Sets: Supine;AROM;Strengthening;Both;5 reps Short Arc Quad: Supine;AROM;Strengthening;Left;10 reps Heel Slides: Supine;AROM;Strengthening;Left;10 reps Hip ABduction/ADduction: AROM;Left;10 reps;Strengthening;Supine Knee Flexion: Seated;PROM Goniometric ROM: AROM 6-85; PROM 0-100 degrees    General Comments        Pertinent Vitals/Pain Pain Assessment: No/denies pain    Home Living                      Prior Function            PT Goals (current goals can now be found in the care plan section)  Frequency    7X/week      PT Plan Current plan remains appropriate    Co-evaluation              AM-PAC PT "6 Clicks" Daily Activity  Outcome Measure  Difficulty turning over in bed (including adjusting bedclothes, sheets and blankets)?: None Difficulty moving from lying on back to sitting on the side of the bed? : None Difficulty sitting down on and standing up from a chair with arms (e.g., wheelchair, bedside commode, etc,.)?: None Help needed moving to and from a bed to chair (including a wheelchair)?: A Little Help needed walking in hospital room?: A Little Help needed climbing 3-5 steps with a  railing? : A Little 6 Click Score: 21    End of Session Equipment Utilized During Treatment: Gait belt Activity Tolerance: Patient tolerated treatment well;Patient limited by fatigue Patient left: in chair;with call bell/phone within reach;in CPM;with family/visitor present Nurse Communication: Mobility status PT Visit Diagnosis: Unsteadiness on feet (R26.81);Other abnormalities of gait and mobility (R26.89);Muscle weakness (generalized) (M62.81)     Time: 1950-9326 PT Time Calculation (min) (ACUTE ONLY): 26 min  Charges:  $Therapeutic Activity: 23-37 mins                    G Codes:       Ihor Austin, LPTA; CBIS [phone]  Aldona Lento 01/05/2017, 9:55 AM

## 2017-01-05 NOTE — Care Management Important Message (Signed)
Important Message  Patient Details  Name: Rhonda Briggs MRN: [id number] Date of Birth: 15-May-1950   Medicare Important Message Given:  Yes    Sherald Barge, RN 01/05/2017, 10:12 AM

## 2017-01-05 NOTE — Progress Notes (Signed)
Discharge instructions gone over with patient, verbalized understanding. IV removed, patient tolerated procedure well. 

## 2017-01-05 NOTE — Care Management (Signed)
CM has updated Tresea Mall, [facility] rep, aware of DC planned today and pt is on the schedule to be seen.

## 2017-01-12 DIAGNOSIS — Z96652 Presence of left artificial knee joint: Secondary | ICD-10-CM | POA: Insufficient documentation

## 2017-01-12 LAB — TYPE AND SCREEN
ABO/RH(D): A NEG
Antibody Screen: NEGATIVE
UNIT DIVISION: 0
Unit division: 0

## 2017-01-12 LAB — BPAM RBC
BLOOD PRODUCT EXPIRATION DATE: 201812252359
BLOOD PRODUCT EXPIRATION DATE: 201812262359
UNIT TYPE AND RH: 600
Unit Type and Rh: 600

## 2017-01-13 ENCOUNTER — Encounter: Payer: Self-pay | Admitting: Orthopedic Surgery

## 2017-01-13 ENCOUNTER — Ambulatory Visit (INDEPENDENT_AMBULATORY_CARE_PROVIDER_SITE_OTHER): Payer: Self-pay | Admitting: Orthopedic Surgery

## 2017-01-13 DIAGNOSIS — Z96652 Presence of left artificial knee joint: Secondary | ICD-10-CM

## 2017-01-13 MED ORDER — HYDROCODONE-ACETAMINOPHEN 10-325 MG PO TABS: 1.0000 | ORAL_TABLET | ORAL | 0 refills | Status: DC | PRN

## 2017-01-13 NOTE — Addendum Note (Signed)
Addended by: Arther Abbott E on: 01/13/2017 11:50 AM   Modules accepted: Orders

## 2017-01-13 NOTE — Patient Instructions (Signed)
Steps to Quit Smoking Smoking tobacco can be bad for your health. It can also affect almost every organ in your body. Smoking puts you and people around you at risk for many serious Nariyah Osias-lasting (chronic) diseases. Quitting smoking is hard, but it is one of the best things that you can do for your health. It is never too late to quit. What are the benefits of quitting smoking? When you quit smoking, you lower your risk for getting serious diseases and conditions. They can include:  Lung cancer or lung disease.  Heart disease.  Stroke.  Heart attack.  Not being able to have children (infertility).  Weak bones (osteoporosis) and broken bones (fractures).  If you have coughing, wheezing, and shortness of breath, those symptoms may get better when you quit. You may also get sick less often. If you are pregnant, quitting smoking can help to lower your chances of having a baby of low birth weight. What can I do to help me quit smoking? Talk with your doctor about what can help you quit smoking. Some things you can do (strategies) include:  Quitting smoking totally, instead of slowly cutting back how much you smoke over a period of time.  Going to in-person counseling. You are more likely to quit if you go to many counseling sessions.  Using resources and support systems, such as: ? Online chats with a counselor. ? Phone quitlines. ? Printed self-help materials. ? Support groups or group counseling. ? Text messaging programs. ? Mobile phone apps or applications.  Taking medicines. Some of these medicines may have nicotine in them. If you are pregnant or breastfeeding, do not take any medicines to quit smoking unless your doctor says it is okay. Talk with your doctor about counseling or other things that can help you.  Talk with your doctor about using more than one strategy at the same time, such as taking medicines while you are also going to in-person counseling. This can help make  quitting easier. What things can I do to make it easier to quit? Quitting smoking might feel very hard at first, but there is a lot that you can do to make it easier. Take these steps:  Talk to your family and friends. Ask them to support and encourage you.  Call phone quitlines, reach out to support groups, or work with a counselor.  Ask people who smoke to not smoke around you.  Avoid places that make you want (trigger) to smoke, such as: ? Bars. ? Parties. ? Smoke-break areas at work.  Spend time with people who do not smoke.  Lower the stress in your life. Stress can make you want to smoke. Try these things to help your stress: ? Getting regular exercise. ? Deep-breathing exercises. ? Yoga. ? Meditating. ? Doing a body scan. To do this, close your eyes, focus on one area of your body at a time from head to toe, and notice which parts of your body are tense. Try to relax the muscles in those areas.  Download or buy apps on your mobile phone or tablet that can help you stick to your quit plan. There are many free apps, such as QuitGuide from the CDC (Centers for Disease Control and Prevention). You can find more support from smokefree.gov and other websites.  This information is not intended to replace advice given to you by your health care provider. Make sure you discuss any questions you have with your health care provider. Document Released: 11/08/2008 Document   Revised: 09/10/2015 Document Reviewed: 05/29/2014 Elsevier Interactive Patient Education  2018 [employer]  

## 2017-01-13 NOTE — Progress Notes (Signed)
Chief Complaint  Patient presents with  . Routine Post Op    Left TKA DOS 12/31/16    Pod # 13    Current Outpatient Medications:  .  aspirin 325 MG EC tablet, Take 1 tablet (325 mg total) by mouth daily with breakfast., Disp: 30 tablet, Rfl: 0 .  bisacodyl (DULCOLAX) 5 MG EC tablet, Take 1 tablet (5 mg total) by mouth daily as needed for moderate constipation., Disp: 30 tablet, Rfl: 0 .  celecoxib (CELEBREX) 200 MG capsule, Take 1 capsule (200 mg total) by mouth daily. Take with food., Disp: 30 capsule, Rfl: 5 .  ferrous sulfate (FEOSOL) 325 (65 FE) MG tablet, Take 1 tablet (325 mg total) by mouth 3 (three) times daily with meals., Disp: 90 tablet, Rfl: 0 .  gabapentin (NEURONTIN) 100 MG capsule, Take 2 capsules (200 mg total) by mouth 3 (three) times daily., Disp: 60 capsule, Rfl: 0 .  hydrochlorothiazide (HYDRODIURIL) 25 MG tablet, Take 25 mg by mouth daily. , Disp: , Rfl:  .  HYDROcodone-acetaminophen (NORCO) 10-325 MG tablet, Take 1 tablet by mouth every 4 (four) hours as needed for severe pain (May give 10 mg if the 7.5 mg is not working)., Disp: 30 tablet, Rfl: 0 .  pramipexole (MIRAPEX) 0.125 MG tablet, Take 0.25 mg by mouth at bedtime. , Disp: , Rfl:   Wound looks clean dry and intact Steri-Strips applied  Patient says she is having more pain with this knee than the last knee but otherwise looks good she did well in therapy in the hospital does not require CPM machine  She has no signs of DVT  Staples taken out, stockings can be stopped aspirin should be continued follow-up in 4 weeks

## 2017-01-14 ENCOUNTER — Telehealth: Payer: Self-pay | Admitting: Orthopedic Surgery

## 2017-01-14 NOTE — Telephone Encounter (Signed)
When the ends start to peel she can take them off. I called to advise

## 2017-01-14 NOTE — Telephone Encounter (Signed)
Patient calling to ask about steri-strips (how long to leaven on).  Please advise

## 2017-01-25 ENCOUNTER — Other Ambulatory Visit: Payer: Self-pay | Admitting: Radiology

## 2017-01-25 DIAGNOSIS — Z96652 Presence of left artificial knee joint: Secondary | ICD-10-CM

## 2017-01-25 NOTE — Telephone Encounter (Addendum)
Patient called, she needs refill of Hydrocodone and Gabapentin  Uses [facility] pharmacy

## 2017-01-27 ENCOUNTER — Other Ambulatory Visit: Payer: Self-pay | Admitting: Orthopedic Surgery

## 2017-01-27 DIAGNOSIS — Z96659 Presence of unspecified artificial knee joint: Secondary | ICD-10-CM

## 2017-01-27 MED ORDER — GABAPENTIN 100 MG PO CAPS: 200.0000 mg | ORAL_CAPSULE | Freq: Three times a day (TID) | ORAL | 0 refills | Status: DC

## 2017-01-27 MED ORDER — HYDROCODONE-ACETAMINOPHEN 7.5-325 MG PO TABS: 1.0000 | ORAL_TABLET | ORAL | 0 refills | Status: DC | PRN

## 2017-01-27 NOTE — Telephone Encounter (Signed)
done

## 2017-01-27 NOTE — Progress Notes (Signed)
pmp aware

## 2017-02-03 ENCOUNTER — Encounter (HOSPITAL_COMMUNITY): Payer: Self-pay | Admitting: Orthopedic Surgery

## 2017-02-10 ENCOUNTER — Ambulatory Visit (INDEPENDENT_AMBULATORY_CARE_PROVIDER_SITE_OTHER): Payer: Self-pay | Admitting: Orthopedic Surgery

## 2017-02-10 ENCOUNTER — Encounter: Payer: Self-pay | Admitting: Orthopedic Surgery

## 2017-02-10 VITALS — BP 146/94 | HR 103 | Ht 61.0 in | Wt 135.0 lb

## 2017-02-10 DIAGNOSIS — Z96652 Presence of left artificial knee joint: Secondary | ICD-10-CM

## 2017-02-10 MED ORDER — HYDROCODONE-ACETAMINOPHEN 5-325 MG PO TABS: 1.0000 | ORAL_TABLET | Freq: Four times a day (QID) | ORAL | 0 refills | Status: DC | PRN

## 2017-02-10 MED ORDER — GABAPENTIN 100 MG PO CAPS: 200.0000 mg | ORAL_CAPSULE | Freq: Three times a day (TID) | ORAL | 0 refills | Status: DC

## 2017-02-10 NOTE — Progress Notes (Signed)
Routine postop status post left total knee on December 6 patient is doing well pain is decreasing she had a little bit of a stitch abscess at the top of the incision.  She wants her medication refilled hydrocodone and gabapentin which is working well  She has full extension 120 degrees of flexion she has no other therapy appointment she is been released she is not using any assistive devices I will see her in 3 months we will continue opioid tapering

## 2017-03-01 ENCOUNTER — Telehealth: Payer: Self-pay | Admitting: Orthopedic Surgery

## 2017-03-01 ENCOUNTER — Other Ambulatory Visit: Payer: Self-pay | Admitting: Orthopedic Surgery

## 2017-03-01 MED ORDER — HYDROCODONE-ACETAMINOPHEN 5-325 MG PO TABS: 1.0000 | ORAL_TABLET | Freq: Two times a day (BID) | ORAL | 0 refills | Status: DC | PRN

## 2017-03-01 NOTE — Telephone Encounter (Signed)
Ok send it there

## 2017-03-01 NOTE — Telephone Encounter (Signed)
Hydrocodone-Acetaminophen  5/325 MG  Qty  42 Tablets  Take 1 tablet by mouth every 6 (six) hours as needed for moderate pain.  Patient uses [facility]

## 2017-03-01 NOTE — Telephone Encounter (Signed)
Judeen Hammans from [facility] called this afternoon stating that Dr. Aline Brochure prescribed Keflex for Rhonda Briggs to take before her cleaning on Wednesday but they prefer the patients to take Amoxicillin.  She asks if we could send Amoxicillin to [facility] for this patient.  She then asked if we could let the patient know that this has been done.  Thanks

## 2017-03-02 ENCOUNTER — Other Ambulatory Visit: Payer: Self-pay | Admitting: Orthopedic Surgery

## 2017-03-02 ENCOUNTER — Telehealth: Payer: Self-pay | Admitting: Orthopedic Surgery

## 2017-03-02 MED ORDER — AMOXICILLIN 500 MG PO TABS: 2000.0000 mg | ORAL_TABLET | Freq: Once | ORAL | 0 refills | Status: AC

## 2017-03-02 NOTE — Telephone Encounter (Signed)
I have called patient to advise this has been done. Left message

## 2017-03-02 NOTE — Telephone Encounter (Signed)
Patient verified - [facility] is correct pharmacy for pain medication prescription; she has picked it up.

## 2017-03-02 NOTE — Telephone Encounter (Signed)
Patient requests refill on medication: gabapentin (NEURONTIN) 100 MG capsule 60 capsule  (200 mg total) by mouth 3 (three) times daily. - Oral  - [facility], [employer]

## 2017-03-02 NOTE — Telephone Encounter (Signed)
Patient called this morning to check about her refill request of pain medication. Patient's pain medication was sent to [place]. When she called in for her refill yesterday I asked her and she stated [facility]. This was put in the refill request. I told her this morning that it had been sent to [facility] and explained to her that the request does have [facility] on it. She stated she would call [facility]. Should we check with [facility] to see if she has picked it up or if we need to cancel that rx and do another to be sent to [facility].  Please advise

## 2017-03-10 ENCOUNTER — Telehealth: Payer: Self-pay | Admitting: Orthopedic Surgery

## 2017-03-10 MED ORDER — HYDROCODONE-ACETAMINOPHEN 5-325 MG PO TABS: 1.0000 | ORAL_TABLET | Freq: Two times a day (BID) | ORAL | 0 refills | Status: DC | PRN

## 2017-03-10 NOTE — Telephone Encounter (Signed)
Last written by Dr Aline Brochure on 03/01/17  Hydrocodone 5/325 quantity of 14  Surgery TKR on 08/19/16  Will you prescribe since he is out of the office?

## 2017-03-10 NOTE — Telephone Encounter (Signed)
I spoke to Dr Minor, we were recommending Keflex, but Rhonda Briggs told us they prefer Amox, so Amox was sent per their request. Dr Minor states she did not get the message from her office that it was okay for her to have the Amox. I advised her. She appreciated the information.

## 2017-03-10 NOTE — Telephone Encounter (Signed)
Call from Dr Minor at [facility] - on line, with patient in office - relays Keflex was to be ordered as pre-med, and states what patient rec'd is Keflex.

## 2017-03-16 ENCOUNTER — Telehealth: Payer: Self-pay | Admitting: Radiology

## 2017-03-16 MED ORDER — AMOXICILLIN 500 MG PO CAPS: 2000.0000 mg | ORAL_CAPSULE | Freq: Once | ORAL | 2 refills | Status: DC | PRN

## 2017-03-16 NOTE — Telephone Encounter (Signed)
Left message for patient, have sent in ABX for the dental work, per protocol (Dentist has asked for Amox and Dr Aline Brochure stated that was fine )

## 2017-03-16 NOTE — Telephone Encounter (Signed)
Dentist office left message that patient needs medical clearance for extractions or cleaning / they left fax number [phone] but no phone number. Dr Aline Brochure can not give medical clearance. She will need to get this from her primary care. I will call patient, can not call dental office, they did not leave a phone number.

## 2017-03-16 NOTE — Telephone Encounter (Signed)
Patient states she just needed the ABX< not clearance, have sent this in for her

## 2017-03-17 ENCOUNTER — Other Ambulatory Visit: Payer: Self-pay | Admitting: Radiology

## 2017-03-17 MED ORDER — AMOXICILLIN 500 MG PO CAPS: 2000.0000 mg | ORAL_CAPSULE | Freq: Once | ORAL | 2 refills | Status: DC | PRN

## 2017-03-23 ENCOUNTER — Other Ambulatory Visit: Payer: Self-pay | Admitting: Orthopedic Surgery

## 2017-03-23 ENCOUNTER — Telehealth: Payer: Self-pay | Admitting: Orthopedic Surgery

## 2017-03-23 ENCOUNTER — Telehealth: Payer: Self-pay | Admitting: Orthopaedic Surgery

## 2017-03-23 DIAGNOSIS — G8929 Other chronic pain: Secondary | ICD-10-CM

## 2017-03-23 MED ORDER — HYDROCODONE-ACETAMINOPHEN 5-325 MG PO TABS: 1.0000 | ORAL_TABLET | Freq: Two times a day (BID) | ORAL | 0 refills | Status: DC | PRN

## 2017-03-23 MED ORDER — GABAPENTIN 100 MG PO CAPS: ORAL_CAPSULE | ORAL | 0 refills | Status: DC

## 2017-03-23 NOTE — Progress Notes (Signed)
Data bank has been checked

## 2017-03-23 NOTE — Telephone Encounter (Signed)
Patient requests refill on Gabapentin (Neurontin) 100 mgs.  Qty  19  Sig: TAKE 2 CAPSULES(200 MG) BY MOUTH THREE TIMES DAILY  Patient states she uses [facility]

## 2017-03-23 NOTE — Telephone Encounter (Signed)
Patient requests refill on Hydrocodone/Acetaminophen 5-325  Mgs.   Qty  14       Sig: Take 1 tablet by mouth every 12 (twelve) hours as needed for moderate pain.     Patient states she uses [facility]

## 2017-04-12 ENCOUNTER — Other Ambulatory Visit: Payer: Self-pay | Admitting: Orthopedic Surgery

## 2017-04-12 ENCOUNTER — Telehealth: Payer: Self-pay | Admitting: Radiology

## 2017-04-12 DIAGNOSIS — G8929 Other chronic pain: Secondary | ICD-10-CM

## 2017-04-12 MED ORDER — GABAPENTIN 100 MG PO CAPS: ORAL_CAPSULE | ORAL | 2 refills | Status: DC

## 2017-04-12 NOTE — Telephone Encounter (Signed)
Hydrocodone-Acetaminophen  5/325mg   Qty 14 tablets  Take 1 tablet by mouth every 12 (twelve) hours as needed for moderate pain.  PATIENT USES [facility] [address]

## 2017-04-12 NOTE — Telephone Encounter (Signed)
I have advised patient to d/c the Hydrocodone, she states she will use Tylenol. She has the Gabapentin and will use it. She has declined pain management, and will let me know if she changes her mind.

## 2017-04-12 NOTE — Telephone Encounter (Signed)
Please try alternalt medication or lets refer to pain management

## 2017-04-12 NOTE — Telephone Encounter (Signed)
Gabapentin 100 mg  Qty 60 Capsules  Take 2 capsules (200 mg) by mouth three times daily.  PATIENT USES [facility] [address]

## 2017-05-12 ENCOUNTER — Ambulatory Visit (INDEPENDENT_AMBULATORY_CARE_PROVIDER_SITE_OTHER): Payer: Medicare Other | Admitting: Orthopedic Surgery

## 2017-05-12 VITALS — BP 111/71 | HR 90 | Ht 61.0 in | Wt 135.0 lb

## 2017-05-12 DIAGNOSIS — M542 Cervicalgia: Secondary | ICD-10-CM

## 2017-05-12 DIAGNOSIS — S6980XA Other specified injuries of unspecified wrist, hand and finger(s), initial encounter: Secondary | ICD-10-CM

## 2017-05-12 DIAGNOSIS — Z96652 Presence of left artificial knee joint: Secondary | ICD-10-CM

## 2017-05-12 DIAGNOSIS — Z96651 Presence of right artificial knee joint: Secondary | ICD-10-CM

## 2017-05-12 MED ORDER — CYCLOBENZAPRINE HCL 5 MG PO TABS: 5.0000 mg | ORAL_TABLET | Freq: Three times a day (TID) | ORAL | 0 refills | Status: DC | PRN

## 2017-05-12 MED ORDER — HYDROCODONE-ACETAMINOPHEN 5-325 MG PO TABS: 1.0000 | ORAL_TABLET | Freq: Four times a day (QID) | ORAL | 0 refills | Status: DC | PRN

## 2017-05-12 NOTE — Progress Notes (Signed)
Progress Note   Patient ID: Rhonda Briggs, female   DOB: 1950/04/10, 67 y.o.   MRN: [id number]  Chief Complaint  Patient presents with  . Follow-up    Recheck on left knee, DOS 12-31-16.    67yo female s/p left tka 12/2016 and rt tka July 2018   C/O pain right wrist and left wrist over the radial ulnar joint for several weeks not responding to splinting pain radiates into the small finger of the right and left hand  Complains of neck pain and stiffness for several weeks with painful and decreased rotation painful flexion without radicular symptoms  Celebrex and gabapentin not controlling pain in the wrist or the neck    Review of Systems  Constitutional: Negative.   Musculoskeletal: Positive for back pain, joint pain and neck pain.  Skin: Negative.   Neurological: Negative for tingling, sensory change and focal weakness.    Meds ordered this encounter  Medications  . cyclobenzaprine (FLEXERIL) 5 MG tablet    Sig: Take 1 tablet (5 mg total) by mouth 3 (three) times daily as needed for muscle spasms.    Dispense:  30 tablet    Refill:  0  . HYDROcodone-acetaminophen (NORCO/VICODIN) 5-325 MG tablet    Sig: Take 1 tablet by mouth every 6 (six) hours as needed for moderate pain.    Dispense:  30 tablet    Refill:  0   Past Medical History:  Diagnosis Date  . Anemia   . Arthritis   . History of kidney stones   . Hypertension   . Osteoporosis   . PONV (postoperative nausea and vomiting)    after hernia surgery    Current Meds  Medication Sig  . celecoxib (CELEBREX) 200 MG capsule Take 1 capsule (200 mg total) by mouth daily. Take with food.  . gabapentin (NEURONTIN) 100 MG capsule TAKE 2 CAPSULES(200 MG) BY MOUTH THREE TIMES DAILY  . hydrochlorothiazide (HYDRODIURIL) 25 MG tablet Take 25 mg by mouth daily.   . pramipexole (MIRAPEX) 0.125 MG tablet Take 0.25 mg by mouth at bedtime.     No Known Allergies   BP 111/71   Pulse 90   Ht 5\' 1"  (1.549 m)   Wt 135 lb (61.2  kg)   BMI 25.51 kg/m   Physical Exam  Constitutional: She is oriented to person, place, and time. She appears well-developed and well-nourished.  Musculoskeletal:       Arms: Neurological: She is alert and oriented to person, place, and time.  Psychiatric: She has a normal mood and affect. Judgment normal.  Vitals reviewed.    Medical decision-making Encounter Diagnoses  Name Primary?  . 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 Injury of triangular fibrocartilage complex (TFCC), unspecified laterality, initial encounter, bilateral right greater than left   . Cervicalgia    Splint right wrist   Fu 2-3 months   Take meds as ordered   (husband hospice)   Arther Abbott, MD 05/12/2017 11:13 AM

## 2017-05-17 ENCOUNTER — Other Ambulatory Visit: Payer: Self-pay | Admitting: Orthopedic Surgery

## 2017-06-28 ENCOUNTER — Other Ambulatory Visit: Payer: Self-pay | Admitting: Orthopedic Surgery

## 2017-08-11 ENCOUNTER — Ambulatory Visit: Payer: Medicare Other | Admitting: Orthopedic Surgery

## 2017-09-20 ENCOUNTER — Other Ambulatory Visit: Payer: Self-pay | Admitting: Orthopedic Surgery

## 2017-09-20 NOTE — Telephone Encounter (Signed)
Patient requests refill of:  gabapentin (NEURONTIN) 100 MG capsule 180 capsule  -pharmacy is [facility], [address], [place]

## 2017-09-21 ENCOUNTER — Other Ambulatory Visit: Payer: Self-pay | Admitting: Orthopedic Surgery

## 2017-09-21 MED ORDER — GABAPENTIN 100 MG PO CAPS: 100.0000 mg | ORAL_CAPSULE | Freq: Three times a day (TID) | ORAL | 1 refills | Status: DC

## 2017-09-21 MED ORDER — GABAPENTIN 100 MG PO CAPS: ORAL_CAPSULE | ORAL | 1 refills | Status: DC

## 2017-09-21 NOTE — Telephone Encounter (Signed)
Submission to pharmacy failed. Have resent

## 2017-09-21 NOTE — Addendum Note (Signed)
Addended byCandice Camp on: 09/21/2017 07:55 AM   Modules accepted: Orders

## 2017-09-23 ENCOUNTER — Telehealth: Payer: Self-pay | Admitting: Radiology

## 2017-09-23 MED ORDER — GABAPENTIN 100 MG PO CAPS: 100.0000 mg | ORAL_CAPSULE | Freq: Three times a day (TID) | ORAL | 1 refills | Status: DC

## 2017-09-23 NOTE — Telephone Encounter (Signed)
Called in/ called patient to advise. Left message.

## 2017-09-23 NOTE — Telephone Encounter (Signed)
-----   Message from Bo Mcclintock sent at 09/23/2017 10:44 AM EDT ----- Patient called stating that her prescription for Gabapentin has not be received at her pharmacy. It shows in the system that the transmission had failed and maybe resent on 09/21/17. She says she had spoken to her pharmacy this morning.  Can you check about this for her.  Thanks

## 2017-10-11 ENCOUNTER — Ambulatory Visit (INDEPENDENT_AMBULATORY_CARE_PROVIDER_SITE_OTHER): Payer: Medicare Other | Admitting: Orthopedic Surgery

## 2017-10-11 VITALS — BP 124/81 | HR 85 | Ht 61.0 in | Wt 145.0 lb

## 2017-10-11 DIAGNOSIS — M19031 Primary osteoarthritis, right wrist: Secondary | ICD-10-CM

## 2017-10-11 DIAGNOSIS — M24131 Other articular cartilage disorders, right wrist: Secondary | ICD-10-CM

## 2017-10-11 DIAGNOSIS — F17219 Nicotine dependence, cigarettes, with unspecified nicotine-induced disorders: Secondary | ICD-10-CM | POA: Diagnosis not present

## 2017-10-11 NOTE — Progress Notes (Signed)
Chief Complaint  Patient presents with  . Follow-up    Recheck on wrist.     This is a 67 years old had a left total knee comes in today for her right wrist had previous x-ray showing arthritis complaints of prominence of the ulna tenderness along the TFCC with pain on certain gripping activities  She had some neck pain the last time we saw her which was treated when she had her eyebrows waxed in the aesthetician did a massage of her head neck and the neck pain went away  Further review of systems bilateral elbow pain with pressure over the olecranon  BP 124/81   Pulse 85   Ht 5\' 1"  (1.549 m)   Wt 145 lb (65.8 kg)   BMI 27.40 kg/m   Physical Exam  Constitutional: She is oriented to person, place, and time. She appears well-developed and well-nourished.  Neurological: She is alert and oriented to person, place, and time.  Psychiatric: She has a normal mood and affect. Judgment normal.  Vitals reviewed.  Right wrist tenderness over the triangle fibrocartilage in the sulcus with positive sulcus sign painful grip painful palpation painful subluxation of the ulna wrist joint otherwise stable she has decreased flexion extension grip strength is normal skin is intact sensation and pulse are good  Prior x-ray 2018 is reviewed and it shows radiocarpal arthritis especially of the radial scaphoid area and the capitate and lunate and capitate and scaphoid.  Slight ulnar prominence ulnar positive wrist.  Encounter Diagnoses  Name Primary?  . Degenerative tear of triangular fibrocartilage complex (TFCC) of right wrist Yes  . Cigarette nicotine dependence with nicotine-induced disorder   . Primary osteoarthritis of right wrist    Triangular fibrocartilage injection  Skin is prepped with alcohol and ethyl chloride patient gave verbal consent pause was taken to confirm injection site.  One-to-one mixture Depo-Medrol lidocaine 1% was injected into the TFCC at the sulcus area  Tolerated well  without complication  I counseled her on smoking she is going to try to get back down to 1/2 pack/day she noted her smoking went up when her husband died recently  She also has arthritis of the wrist and we have advised that she may need a hand consult if this injection does not help

## 2017-10-11 NOTE — Patient Instructions (Signed)
Steps to Quit Smoking Smoking tobacco can be bad for your health. It can also affect almost every organ in your body. Smoking puts you and people around you at risk for many serious long-lasting (chronic) diseases. Quitting smoking is hard, but it is one of the best things that you can do for your health. It is never too late to quit. What are the benefits of quitting smoking? When you quit smoking, you lower your risk for getting serious diseases and conditions. They can include:  Lung cancer or lung disease.  Heart disease.  Stroke.  Heart attack.  Not being able to have children (infertility).  Weak bones (osteoporosis) and broken bones (fractures).  If you have coughing, wheezing, and shortness of breath, those symptoms may get better when you quit. You may also get sick less often. If you are pregnant, quitting smoking can help to lower your chances of having a baby of low birth weight. What can I do to help me quit smoking? Talk with your doctor about what can help you quit smoking. Some things you can do (strategies) include:  Quitting smoking totally, instead of slowly cutting back how much you smoke over a period of time.  Going to in-person counseling. You are more likely to quit if you go to many counseling sessions.  Using resources and support systems, such as: ? Online chats with a counselor. ? Phone quitlines. ? Printed self-help materials. ? Support groups or group counseling. ? Text messaging programs. ? Mobile phone apps or applications.  Taking medicines. Some of these medicines may have nicotine in them. If you are pregnant or breastfeeding, do not take any medicines to quit smoking unless your doctor says it is okay. Talk with your doctor about counseling or other things that can help you.  Talk with your doctor about using more than one strategy at the same time, such as taking medicines while you are also going to in-person counseling. This can help make  quitting easier. What things can I do to make it easier to quit? Quitting smoking might feel very hard at first, but there is a lot that you can do to make it easier. Take these steps:  Talk to your family and friends. Ask them to support and encourage you.  Call phone quitlines, reach out to support groups, or work with a counselor.  Ask people who smoke to not smoke around you.  Avoid places that make you want (trigger) to smoke, such as: ? Bars. ? Parties. ? Smoke-break areas at work.  Spend time with people who do not smoke.  Lower the stress in your life. Stress can make you want to smoke. Try these things to help your stress: ? Getting regular exercise. ? Deep-breathing exercises. ? Yoga. ? Meditating. ? Doing a body scan. To do this, close your eyes, focus on one area of your body at a time from head to toe, and notice which parts of your body are tense. Try to relax the muscles in those areas.  Download or buy apps on your mobile phone or tablet that can help you stick to your quit plan. There are many free apps, such as QuitGuide from the CDC (Centers for Disease Control and Prevention). You can find more support from smokefree.gov and other websites.  This information is not intended to replace advice given to you by your health care provider. Make sure you discuss any questions you have with your health care provider. Document Released: 11/08/2008 Document   Revised: 09/10/2015 Document Reviewed: 05/29/2014 Elsevier Interactive Patient Education  2018 Elsevier Inc.  

## 2017-12-15 ENCOUNTER — Other Ambulatory Visit: Payer: Self-pay | Admitting: Orthopedic Surgery

## 2017-12-15 ENCOUNTER — Other Ambulatory Visit (HOSPITAL_COMMUNITY): Payer: Self-pay | Admitting: Family Medicine

## 2017-12-15 DIAGNOSIS — J849 Interstitial pulmonary disease, unspecified: Secondary | ICD-10-CM

## 2018-01-04 ENCOUNTER — Ambulatory Visit (HOSPITAL_COMMUNITY)
Admission: RE | Admit: 2018-01-04 | Discharge: 2018-01-04 | Disposition: A | Discharge status: Home / Self Care | Payer: Medicare Other | Source: Ambulatory Visit | Attending: Family Medicine | Admitting: Family Medicine

## 2018-01-04 DIAGNOSIS — J849 Interstitial pulmonary disease, unspecified: Secondary | ICD-10-CM | POA: Diagnosis not present

## 2018-02-03 ENCOUNTER — Institutional Professional Consult (permissible substitution): Payer: Medicare Other | Admitting: Emergency Medicine

## 2018-02-08 ENCOUNTER — Other Ambulatory Visit: Payer: Self-pay | Admitting: Orthopedic Surgery

## 2018-02-09 ENCOUNTER — Other Ambulatory Visit: Payer: Self-pay | Admitting: Orthopedic Surgery

## 2018-02-23 ENCOUNTER — Encounter: Payer: Self-pay | Admitting: Orthopedic Surgery

## 2018-02-23 ENCOUNTER — Ambulatory Visit (INDEPENDENT_AMBULATORY_CARE_PROVIDER_SITE_OTHER): Payer: Medicare Other

## 2018-02-23 ENCOUNTER — Ambulatory Visit (INDEPENDENT_AMBULATORY_CARE_PROVIDER_SITE_OTHER): Payer: Medicare Other | Admitting: Orthopedic Surgery

## 2018-02-23 VITALS — BP 126/78 | HR 88 | Ht 61.0 in | Wt 145.0 lb

## 2018-02-23 DIAGNOSIS — Z96652 Presence of left artificial knee joint: Secondary | ICD-10-CM | POA: Diagnosis not present

## 2018-02-23 DIAGNOSIS — Z96651 Presence of right artificial knee joint: Secondary | ICD-10-CM

## 2018-02-23 NOTE — Progress Notes (Signed)
Chief Complaint  Patient presents with  . Knee Pain    right lateral knee pain stiffness     68 years old status post bilateral total knees left in December 2018 right July 2018 presents complaining that she got up felt acute sharp lateral right knee pain near the iliotibial band insertion at Gertie's tubercle had pain for several days.  She is going out of town for several months and she would like both knees x-rayed for her annual x-ray survey  Initial intense pain has calmed down.  She does feel some crepitance in both knees and says that the right leg is bigger than the left.  No treatment was required.  Pain has subsided.  Review of Systems  Constitutional: Negative for chills, fever, malaise/fatigue and weight loss.  All other systems reviewed and are negative.  Past Medical History:  Diagnosis Date  . Anemia   . Arthritis   . History of kidney stones   . Hypertension   . Osteoporosis   . PONV (postoperative nausea and vomiting)    after hernia surgery   BP 126/78   Pulse 88   Ht 5\' 1"  (1.549 m)   Wt 145 lb (65.8 kg)   BMI 27.40 kg/m   Physical Exam Vitals signs reviewed.  Constitutional:      Appearance: Normal appearance. She is well-developed.  Neurological:     Mental Status: She is alert and oriented to person, place, and time.     Gait: Gait normal.  Psychiatric:        Attention and Perception: Attention normal.        Mood and Affect: Mood and affect normal.        Speech: Speech normal.        Behavior: Behavior normal.        Thought Content: Thought content normal.        Judgment: Judgment normal.    Right knee tenderness at Gertie's tubercle range of motion 120 degrees stable strength normal skin intact no erythema no joint swelling mild peripheral edema normal sensation  Left knee flexion arc is about the same at 120 degrees no swelling no tenderness the knee is stable strength is normal skin is intact pulses are good sensation is  normal   Encounter Diagnoses  Name Primary?  . 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 S/P total knee replacement, left 12/31/16     X-rays were done of both knees  Report is been dictated Both implants look stable alignment is within normal range  Probable strain of the iliotibial band  Seems to have resolved  Follow-up in September as scheduled

## 2018-02-23 NOTE — Patient Instructions (Signed)
Call Dr Webb [place] podiatrist for an appointment  Location: [address], [address], [facility] Phone: [phone]

## 2018-05-02 ENCOUNTER — Other Ambulatory Visit: Payer: Self-pay | Admitting: Orthopedic Surgery

## 2018-09-30 ENCOUNTER — Telehealth: Payer: Self-pay | Admitting: Orthopedic Surgery

## 2018-09-30 NOTE — Telephone Encounter (Signed)
Patient called to inquire about her left knee pain (already has scheduled appt 10/10/18), history of total knee surgery in December of 2018 by Dr Aline Brochure - wanted to relay to nurse/clinic staff that the knee hurts at bend, and also when trying to get up from sitting position.  Asking if anything can be recommended/prescribed until appointment?  If so, [place].

## 2018-10-04 NOTE — Telephone Encounter (Signed)
Tylenol OTC 500 mg q6 and ben gay 4 x a day

## 2018-10-04 NOTE — Telephone Encounter (Signed)
Thanks I have called her to advise.  °

## 2018-10-04 NOTE — Telephone Encounter (Signed)
Since I can not prescribe or treat I am sending message to Dr Aline Brochure

## 2018-10-10 ENCOUNTER — Other Ambulatory Visit: Payer: Self-pay

## 2018-10-10 ENCOUNTER — Ambulatory Visit (INDEPENDENT_AMBULATORY_CARE_PROVIDER_SITE_OTHER): Payer: Medicare Other | Admitting: Orthopedic Surgery

## 2018-10-10 ENCOUNTER — Encounter: Payer: Self-pay | Admitting: Orthopedic Surgery

## 2018-10-10 VITALS — BP 153/66 | HR 87 | Ht 61.0 in | Wt 156.0 lb

## 2018-10-10 DIAGNOSIS — Z96652 Presence of left artificial knee joint: Secondary | ICD-10-CM | POA: Diagnosis not present

## 2018-10-10 DIAGNOSIS — M5410 Radiculopathy, site unspecified: Secondary | ICD-10-CM

## 2018-10-10 DIAGNOSIS — Z96651 Presence of right artificial knee joint: Secondary | ICD-10-CM | POA: Diagnosis not present

## 2018-10-10 MED ORDER — GABAPENTIN 100 MG PO CAPS: 100.0000 mg | ORAL_CAPSULE | Freq: Three times a day (TID) | ORAL | 2 refills | Status: DC

## 2018-10-10 NOTE — Progress Notes (Signed)
  Encounter Diagnoses  Name Primary?  . 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 S/P total knee replacement, left 12/31/16   . Radicular leg pain, left leg     Chief Complaint  Patient presents with  . Knee Pain    good days  bad days     68 had to total knees complains of left leg pain running down the lateral side she did have some anterior knee pain which resolved with ice she still having pain running down the left leg in the L5 distribution denies back pain  Other review of systems negative for weakness bowel bladder dysfunction  BP (!) 153/66   Pulse 87   Ht 5\' 1"  (1.549 m)   Wt 156 lb (70.8 kg)   BMI 29.48 kg/m   Exam shows a well-developed well-nourished female oriented x3 mood affect normal gait and station reasonable with no significant limp  Normal range of motion left knee well-healed anterior incision no tenderness around the knee joint except at the lateral aspect of the knee which tracks down the anterior compartment as well as the posterior lateral leg and left gluteal area and lower back  Encounter Diagnoses  Name Primary?  . 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 S/P total knee replacement, left 12/31/16   . Radicular leg pain, left leg    Meds ordered this encounter  Medications  . gabapentin (NEURONTIN) 100 MG capsule    Sig: Take 1 capsule (100 mg total) by mouth 3 (three) times daily.    Dispense:  180 capsule    Refill:  2    Recommend gabapentin resume  X-rays both knees in a year

## 2018-12-13 ENCOUNTER — Other Ambulatory Visit: Payer: Self-pay | Admitting: Orthopedic Surgery

## 2019-03-28 ENCOUNTER — Other Ambulatory Visit: Payer: Self-pay | Admitting: Orthopedic Surgery

## 2019-03-28 DIAGNOSIS — M5410 Radiculopathy, site unspecified: Secondary | ICD-10-CM

## 2019-03-28 MED ORDER — GABAPENTIN 100 MG PO CAPS: 100.0000 mg | ORAL_CAPSULE | Freq: Three times a day (TID) | ORAL | 2 refills | Status: DC

## 2019-03-28 NOTE — Telephone Encounter (Signed)
Refill request received from [facility] for: gabapentin (NEURONTIN) 100 MG capsule / 2 capsules by mouth 3 times daily  -

## 2019-06-30 NOTE — Telephone Encounter (Signed)
Error

## 2019-09-21 ENCOUNTER — Telehealth: Payer: Self-pay | Admitting: Orthopedic Surgery

## 2019-09-21 NOTE — Telephone Encounter (Signed)
Patient called to request medication Meloxicam for her arthritis if Dr Aline Brochure would approve. Aware she has been getting refills for Celebrex.* * Patient called directly back to ask me to disregard the message; states her pharmacy has already filled her Celebrex prescription. Aware of her upcoming appointment with Dr Aline Brochure in September. Closing note per patient request.

## 2019-10-09 ENCOUNTER — Ambulatory Visit: Payer: Medicare Other | Admitting: Orthopedic Surgery

## 2019-10-11 ENCOUNTER — Ambulatory Visit: Payer: Medicare Other | Admitting: Orthopedic Surgery

## 2019-10-25 ENCOUNTER — Encounter (HOSPITAL_COMMUNITY): Payer: Self-pay | Admitting: Physical Therapy

## 2019-10-25 ENCOUNTER — Ambulatory Visit (HOSPITAL_COMMUNITY): Payer: Medicare Other | Attending: Family Medicine | Admitting: Physical Therapy

## 2019-10-25 ENCOUNTER — Other Ambulatory Visit: Payer: Self-pay

## 2019-10-25 DIAGNOSIS — R293 Abnormal posture: Secondary | ICD-10-CM | POA: Insufficient documentation

## 2019-10-25 DIAGNOSIS — M542 Cervicalgia: Secondary | ICD-10-CM | POA: Diagnosis not present

## 2019-10-25 NOTE — Therapy (Signed)
[place], [place], [address] Phone: [phone]   Fax:  [phone]  Physical Therapy Evaluation  Patient Details  Name: Rhonda Briggs MRN: [id number] Date of Birth: January 17, 1951 Referring Provider (PT): Curtis Sites MD   Encounter Date: 10/25/2019   PT End of Session - 10/25/19 1604    Visit Number 1    Number of Visits 8    Date for PT Re-Evaluation 11/24/19    Authorization Type Medicare A    PT Start Time 2505    PT Stop Time 1430    PT Time Calculation (min) 45 min    Activity Tolerance Patient tolerated treatment well    Behavior During Therapy [facility] for tasks assessed/performed           Past Medical History:  Diagnosis Date  . Anemia   . Arthritis   . History of kidney stones   . Hypertension   . Osteoporosis   . PONV (postoperative nausea and vomiting)    after hernia surgery    Past Surgical History:  Procedure Laterality Date  . CARPAL TUNNEL RELEASE Right 2011  . HERNIA REPAIR Left    inguinal  . TOTAL KNEE ARTHROPLASTY Right 08/19/2016   Procedure: TOTAL KNEE ARTHROPLASTY;  Surgeon: Carole Civil, MD;  Location: [facility];  Service: Orthopedics;  Laterality: Right;  . TOTAL KNEE ARTHROPLASTY Left 12/31/2016   Procedure: TOTAL KNEE ARTHROPLASTY;  Surgeon: Carole Civil, MD;  Location: [facility];  Service: Orthopedics;  Laterality: Left;  . TUBAL LIGATION      There were no vitals filed for this visit.    Subjective Assessment - 10/25/19 1401    Subjective Patient presents to physical therapy with complaint of shooting pain in the back of her neck. Patient says this started 2 years ago when she got a message from a lady in [place]. Says pain went away on its own for 2 years, but is back now. Says it hurts when she turns her head. Patient says pain is constant. Worse with turning and looking up and down. Reports taking only Tylenol. Denies recent cervical imaging.    Limitations  Sitting;Reading;House hold activities    Patient Stated Goals Get pain to go away    Currently in Pain? Yes    Pain Score 7     Pain Location Neck    Pain Orientation Upper;Posterior    Pain Descriptors / Indicators Dull;Sharp    Pain Type Acute pain    Pain Onset 1 to 4 weeks ago    Pain Frequency Intermittent    Aggravating Factors  turning head, looking up or down    Pain Relieving Factors looking straight, tylenol    Effect of Pain on Daily Activities Limits              [facility] PT Assessment - 10/25/19 0001      Assessment   Medical Diagnosis Headaches     Referring Provider (PT) Curtis Sites MD    Onset Date/Surgical Date 10/11/19    Next MD Visit 11/24/19    Prior Therapy Yes for knees       Precautions   Precautions None      Restrictions   Weight Bearing Restrictions No      Balance Screen   Has the patient fallen in the past 6 months No      [place]    Living Arrangements Alone  Prior Function   Level of Independence Independent      Cognition   Overall Cognitive Status Within Functional Limits for tasks assessed      Posture/Postural Control   Posture/Postural Control Postural limitations    Postural Limitations Rounded Shoulders;Forward head;Increased thoracic kyphosis      ROM / Strength   AROM / PROM / Strength AROM      AROM   Overall AROM Comments Mod restriction in bilateral shoulder flexion     AROM Assessment Site Cervical    Cervical Flexion 30   pain    Cervical Extension 42    Cervical - Right Side Bend 29    Cervical - Left Side Bend 30   pain    Cervical - Right Rotation 35   pain    Cervical - Left Rotation 53      Palpation   Palpation comment Mod TTP about bilateral suboccipitals                       Objective measurements completed on examination: See above findings.       [facility] Adult PT Treatment/Exercise - 10/25/19 0001      Exercises    Exercises Neck      Neck Exercises: Seated   Other Seated Exercise scapular retraction 5 x5"     Other Seated Exercise seated postural correction 5 x 5"       Manual Therapy   Manual Therapy Soft tissue mobilization    Manual therapy comments Manual treatment completed separate form all other activity     Soft tissue mobilization STM to bilateral sub occipitals and sub occipital release 4 x 60" with patient in supine                   PT Education - 10/25/19 1403    Education Details on evaluation findings, POC and HEP    Person(s) Educated Patient    Methods Explanation    Comprehension Verbalized understanding            PT Short Term Goals - 10/25/19 1609      PT SHORT TERM GOAL #1   Title Patient will be independent with initial HEP and self-management strategies to improve functional outcomes    Time 2    Period Weeks    Status New    Target Date 11/10/19             PT Long Term Goals - 10/25/19 1609      PT LONG TERM GOAL #1   Title Patient improve RT cervical rotation by 15 degrees in order to improve ability to scan environment for safety and while driving.    Time 4    Period Weeks    Status New    Target Date 11/24/19      PT LONG TERM GOAL #2   Title Patient will report at least 75% overall improvement in subjective complaint to indicate improvement in ability to perform ADLs.    Time 4    Period Weeks    Status New    Target Date 11/24/19      PT LONG TERM GOAL #3   Title Patient will be able to sit > 60 minutes with pain not to exceed 3/10 in neck to improve ability to perform ADLs, eating, resting, reading, leisure    Time 4    Period Weeks    Status New    Target Date 11/24/19  Plan - 10/25/19 1606    Clinical Impression Statement Patient is a 69 y.o. female who presents to physical therapy with complaint of neck pain and headaches. Patient demonstrates decreased ROM restriction, reduced flexibility,  increased tenderness to palpation and postural abnormalities which are likely contributing to symptoms of pain and are negatively impacting patient ability to perform ADLs. Patient will benefit from skilled physical therapy services to address these deficits to reduce pain and improve level of function with ADLs    Examination-Activity Limitations Sleep;Sit;Reach Overhead;Lift    Examination-Participation Restrictions Yard Work;Cleaning;Laundry    Stability/Clinical Decision Making Stable/Uncomplicated    Clinical Decision Making Low    Rehab Potential Good    PT Frequency 2x / week    PT Duration 4 weeks    PT Treatment/Interventions ADLs/Self Care Home Management;Aquatic Therapy;Biofeedback;Electrical Stimulation;Contrast Bath;Therapeutic exercise;Orthotic Fit/Training;Patient/family education;Therapeutic activities;Functional mobility training;Stair training;Balance training;Neuromuscular re-education;Manual techniques;Manual lymph drainage;Compression bandaging;Parrafin;Fluidtherapy;Cryotherapy;Ultrasound;Traction;Moist Heat;Iontophoresis 4mg /ml Dexamethasone;DME Instruction;Gait training;Scar mobilization;Passive range of motion;Dry needling;Energy conservation;Splinting;Taping;Vasopneumatic Device;Joint Manipulations;Spinal Manipulations    PT Next Visit Plan Review goals and HEP. Assess response to manual and HEP. Continue manual and progress postural strengthening/ cervical mobility as tolerated    PT Home Exercise Plan 10/25/19: postural correction, scap retraction    Consulted and Agree with Plan of Care Patient           Patient will benefit from skilled therapeutic intervention in order to improve the following deficits and impairments:  Pain, Improper body mechanics, Increased fascial restricitons, Impaired flexibility, Hypomobility, Decreased range of motion, Postural dysfunction  Visit Diagnosis: Cervicalgia  Abnormal posture     Problem List Patient Active Problem List    Diagnosis Date Noted  . 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 Primary osteoarthritis 08/19/2016  . 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 Primary osteoarthritis of right knee     4:13 PM, 10/25/19 Josue Hector PT DPT  Physical Therapist with [facility]  [phone] [place] [address] [place], [place], [address] Phone: [phone]   Fax:  [phone]  Name: Rhonda Briggs MRN: [id number] Date of Birth: 1950/03/12

## 2019-10-25 NOTE — Patient Instructions (Signed)
Access Code: [email or web address] URL: [email or web address] Date: 10/25/2019 Prepared by: Josue Hector  Exercises Correct Seated Posture - 3 x daily - 7 x weekly - 2 sets - 10 reps - 5 sec hold Seated Scapular Retraction - 3 x daily - 7 x weekly - 2 sets - 10 reps - 5 sec hold

## 2019-10-30 ENCOUNTER — Ambulatory Visit (HOSPITAL_COMMUNITY): Payer: Medicare Other | Attending: Family Medicine | Admitting: Physical Therapy

## 2019-10-30 ENCOUNTER — Encounter (HOSPITAL_COMMUNITY): Payer: Self-pay | Admitting: Physical Therapy

## 2019-10-30 ENCOUNTER — Other Ambulatory Visit: Payer: Self-pay

## 2019-10-30 ENCOUNTER — Ambulatory Visit: Payer: Medicare Other | Admitting: Orthopedic Surgery

## 2019-10-30 DIAGNOSIS — M542 Cervicalgia: Secondary | ICD-10-CM | POA: Insufficient documentation

## 2019-10-30 DIAGNOSIS — R293 Abnormal posture: Secondary | ICD-10-CM | POA: Diagnosis present

## 2019-10-30 NOTE — Patient Instructions (Signed)
Access Code: [email or web address] URL: [email or web address] Date: 10/30/2019 Prepared by: Josue Hector  Exercises Seated Upper Trapezius Stretch - 2 x daily - 7 x weekly - 1 sets - 3 reps - 30 sec hold Seated Cervical Retraction - 2 x daily - 7 x weekly - 2 sets - 10 reps

## 2019-10-30 NOTE — Therapy (Signed)
[place], [place], [address] Phone: [phone]   Fax:  [phone]  Physical Therapy Treatment  Patient Details  Name: Rhonda Briggs MRN: [id number] Date of Birth: [birth year] Referring Provider (PT): Curtis Sites MD   Encounter Date: 10/30/2019   PT End of Session - 10/30/19 1312    Visit Number 2    Number of Visits 8    Date for PT Re-Evaluation 11/24/19    Authorization Type Medicare A    PT Start Time 1305    PT Stop Time 1343    PT Time Calculation (min) 38 min    Activity Tolerance Patient tolerated treatment well    Behavior During Therapy [facility] for tasks assessed/performed           Past Medical History:  Diagnosis Date   Anemia    Arthritis    History of kidney stones    Hypertension    Osteoporosis    PONV (postoperative nausea and vomiting)    after hernia surgery    Past Surgical History:  Procedure Laterality Date   CARPAL TUNNEL RELEASE Right 2011   HERNIA REPAIR Left    inguinal   TOTAL KNEE ARTHROPLASTY Right 08/19/2016   Procedure: TOTAL KNEE ARTHROPLASTY;  Surgeon: Carole Civil, MD;  Location: [facility];  Service: Orthopedics;  Laterality: Right;   TOTAL KNEE ARTHROPLASTY Left 12/31/2016   Procedure: TOTAL KNEE ARTHROPLASTY;  Surgeon: Carole Civil, MD;  Location: [facility];  Service: Orthopedics;  Laterality: Left;   TUBAL LIGATION      There were no vitals filed for this visit.   Subjective Assessment - 10/30/19 1308    Subjective Patient says "I can feel it a little bit this morning"    Limitations Sitting;Reading;House hold activities    Patient Stated Goals Get pain to go away    Currently in Pain? Yes    Pain Score 3     Pain Location Neck    Pain Orientation Posterior;Upper    Pain Descriptors / Indicators Dull;Sharp    Pain Type Acute pain    Pain Onset 1 to 4 weeks ago                             [facility] Adult PT  Treatment/Exercise - 10/30/19 0001      Neck Exercises: Seated   Neck Retraction 10 reps;5 secs    Other Seated Exercise scapular retraction 10 x5", 3D cervical excursions x 10 each      Other Seated Exercise seated postural correction 10 x 5"       Manual Therapy   Manual Therapy Soft tissue mobilization    Manual therapy comments Manual treatment completed separate form all other activity     Soft tissue mobilization STM to bilateral sub occipitals and sub occipital release 4 x 60" with patient in supine       Neck Exercises: Stretches   Upper Trapezius Stretch Right;Left;2 reps;30 seconds                    PT Short Term Goals - 10/25/19 1609      PT SHORT TERM GOAL #1   Title Patient will be independent with initial HEP and self-management strategies to improve functional outcomes    Time 2    Period Weeks    Status New    Target Date 11/10/19  PT Long Term Goals - 10/25/19 1609      PT LONG TERM GOAL #1   Title Patient improve RT cervical rotation by 15 degrees in order to improve ability to scan environment for safety and while driving.    Time 4    Period Weeks    Status New    Target Date 11/24/19      PT LONG TERM GOAL #2   Title Patient will report at least 75% overall improvement in subjective complaint to indicate improvement in ability to perform ADLs.    Time 4    Period Weeks    Status New    Target Date 11/24/19      PT LONG TERM GOAL #3   Title Patient will be able to sit > 60 minutes with pain not to exceed 3/10 in neck to improve ability to perform ADLs, eating, resting, reading, leisure    Time 4    Period Weeks    Status New    Target Date 11/24/19                 Plan - 10/30/19 1345    Clinical Impression Statement Began session with therapy goals and HEP review. Progressed cervical mobility and postural strengthening. Patient cued on proper form and function of all added ther ex. Patient cued on performing  cervical mobility through pain free ROM. Patient with noted trigger points ongoing about bilateral suboccipital. Improved to some degree with manual treatment. Patient educated on and issued updated HEP handout.    Examination-Activity Limitations Sleep;Sit;Reach Overhead;Lift    Examination-Participation Restrictions Yard Work;Cleaning;Laundry    Stability/Clinical Decision Making Stable/Uncomplicated    Rehab Potential Good    PT Frequency 2x / week    PT Duration 4 weeks    PT Treatment/Interventions ADLs/Self Care Home Management;Aquatic Therapy;Biofeedback;Electrical Stimulation;Contrast Bath;Therapeutic exercise;Orthotic Fit/Training;Patient/family education;Therapeutic activities;Functional mobility training;Stair training;Balance training;Neuromuscular re-education;Manual techniques;Manual lymph drainage;Compression bandaging;Parrafin;Fluidtherapy;Cryotherapy;Ultrasound;Traction;Moist Heat;Iontophoresis 4mg /ml Dexamethasone;DME Instruction;Gait training;Scar mobilization;Passive range of motion;Dry needling;Energy conservation;Splinting;Taping;Vasopneumatic Device;Joint Manipulations;Spinal Manipulations    PT Next Visit Plan Continue manual and progress postural strengthening/ cervical mobility as tolerated    PT Home Exercise Plan 10/25/19: postural correction, scap retraction 10/30/19: UT stretch, chin tuck    Consulted and Agree with Plan of Care Patient           Patient will benefit from skilled therapeutic intervention in order to improve the following deficits and impairments:  Pain, Improper body mechanics, Increased fascial restricitons, Impaired flexibility, Hypomobility, Decreased range of motion, Postural dysfunction  Visit Diagnosis: Cervicalgia  Abnormal posture     Problem List Patient Active Problem List   Diagnosis Date Noted   S/P total knee replacement, left 12/31/16 01/12/2017   Primary osteoarthritis 08/19/2016   S/P TKR (total knee replacement), right  08/19/2016    Primary osteoarthritis of right knee     1:48 PM, 10/30/19 Josue Hector PT DPT  Physical Therapist with [facility]  [phone] [place], [place], [address] Phone: [phone]   Fax:  [phone]  Name: Rhonda Briggs MRN: [id number] Date of Birth: 02/22/1950

## 2019-11-01 ENCOUNTER — Other Ambulatory Visit: Payer: Self-pay

## 2019-11-01 ENCOUNTER — Ambulatory Visit (HOSPITAL_COMMUNITY): Payer: Medicare Other | Admitting: Physical Therapy

## 2019-11-01 DIAGNOSIS — M542 Cervicalgia: Secondary | ICD-10-CM | POA: Diagnosis not present

## 2019-11-01 DIAGNOSIS — R293 Abnormal posture: Secondary | ICD-10-CM

## 2019-11-01 NOTE — Therapy (Signed)
Rhonda Briggs, [place], [address] Phone: [phone]   Fax:  [phone]  Physical Therapy Treatment  Patient Details  Name: Rhonda Briggs MRN: [id number] Date of Birth: [birth year] Referring Provider (PT): Curtis Sites MD   Encounter Date: 11/01/2019   PT End of Session - 11/01/19 1555    Visit Number 3    Number of Visits 8    Date for PT Re-Evaluation 11/24/19    Authorization Type Medicare A    PT Start Time 1534    PT Stop Time 1612    PT Time Calculation (min) 38 min    Activity Tolerance Patient tolerated treatment well    Behavior During Therapy [facility] for tasks assessed/performed           Past Medical History:  Diagnosis Date  . Anemia   . Arthritis   . History of kidney stones   . Hypertension   . Osteoporosis   . PONV (postoperative nausea and vomiting)    after hernia surgery    Past Surgical History:  Procedure Laterality Date  . CARPAL TUNNEL RELEASE Right 2011  . HERNIA REPAIR Left    inguinal  . TOTAL KNEE ARTHROPLASTY Right 08/19/2016   Procedure: TOTAL KNEE ARTHROPLASTY;  Surgeon: Carole Civil, MD;  Location: [facility];  Service: Orthopedics;  Laterality: Right;  . TOTAL KNEE ARTHROPLASTY Left 12/31/2016   Procedure: TOTAL KNEE ARTHROPLASTY;  Surgeon: Carole Civil, MD;  Location: [facility];  Service: Orthopedics;  Laterality: Left;  . TUBAL LIGATION      There were no vitals filed for this visit.   Subjective Assessment - 11/01/19 1542    Subjective pt states it is feeling better at 3/10.  has not been having the sharp shooting pains    Currently in Pain? Yes    Pain Score 3     Pain Location Neck    Pain Orientation Left    Pain Descriptors / Indicators Aching;Dull    Pain Radiating Towards with Lt sided headache and into shoulder                             [facility] Adult PT Treatment/Exercise - 11/01/19 0001      Neck Exercises: Seated   Neck  Retraction 10 reps;5 secs    X to V 10 reps    W Back 10 reps    Shoulder Shrugs 10 reps    Other Seated Exercise scapular retraction 10 x5", 3D cervical excursions x 10 each      Other Seated Exercise thoracic extensions in chair 10 reps      Manual Therapy   Manual Therapy Soft tissue mobilization    Manual therapy comments Manual treatment completed separate form all other activity     Soft tissue mobilization STM to bilateral sub occipitals and sub occipital release 4 x 60" with patient in supine       Neck Exercises: Stretches   Upper Trapezius Stretch Right;Left;2 reps;30 seconds                    PT Short Term Goals - 11/01/19 1554      PT SHORT TERM GOAL #1   Title Patient will be independent with initial HEP and self-management strategies to improve functional outcomes    Time 2    Period Weeks    Status New    Target Date  11/10/19             PT Long Term Goals - 11/01/19 1554      PT LONG TERM GOAL #1   Title Patient improve RT cervical rotation by 15 degrees in order to improve ability to scan environment for safety and while driving.    Time 4    Period Weeks    Status On-going      PT LONG TERM GOAL #2   Title Patient will report at least 75% overall improvement in subjective complaint to indicate improvement in ability to perform ADLs.    Time 4    Period Weeks    Status On-going      PT LONG TERM GOAL #3   Title Patient will be able to sit > 60 minutes with pain not to exceed 3/10 in neck to improve ability to perform ADLs, eating, resting, reading, leisure    Time 4    Period Weeks    Status On-going                 Plan - 11/01/19 1613    Clinical Impression Statement continued to focus on improving cervical ROM and postural strength.  Added thoracic extension in chair., scapular retractions and X-V exercise with cues for form and posturing. Cues to hold stretches longer and isolate mm rather than moving entire body into  stretch.  Completed manual both seated to bil UT/scap region and in supine to utilized suboccipital release maneuvers.  Pt reported overall improvment at end of session.    Examination-Activity Limitations Sleep;Sit;Reach Overhead;Lift    Examination-Participation Restrictions Yard Work;Cleaning;Laundry    Stability/Clinical Decision Making Stable/Uncomplicated    Rehab Potential Good    PT Frequency 2x / week    PT Duration 4 weeks    PT Treatment/Interventions ADLs/Self Care Home Management;Aquatic Therapy;Biofeedback;Electrical Stimulation;Contrast Bath;Therapeutic exercise;Orthotic Fit/Training;Patient/family education;Therapeutic activities;Functional mobility training;Stair training;Balance training;Neuromuscular re-education;Manual techniques;Manual lymph drainage;Compression bandaging;Parrafin;Fluidtherapy;Cryotherapy;Ultrasound;Traction;Moist Heat;Iontophoresis 4mg /ml Dexamethasone;DME Instruction;Gait training;Scar mobilization;Passive range of motion;Dry needling;Energy conservation;Splinting;Taping;Vasopneumatic Device;Joint Manipulations;Spinal Manipulations    PT Next Visit Plan Continue manual and progress postural strengthening/ cervical mobility as tolerated    PT Home Exercise Plan 10/25/19: postural correction, scap retraction 10/30/19: UT stretch, chin tuck    Consulted and Agree with Plan of Care Patient           Patient will benefit from skilled therapeutic intervention in order to improve the following deficits and impairments:  Pain, Improper body mechanics, Increased fascial restricitons, Impaired flexibility, Hypomobility, Decreased range of motion, Postural dysfunction  Visit Diagnosis: Cervicalgia  Abnormal posture     Problem List Patient Active Problem List   Diagnosis Date Noted  . 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 Primary osteoarthritis 08/19/2016  . 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 Primary osteoarthritis of  right knee    Rhonda Briggs, PTA/CLT [phone]  Rhonda Briggs 11/01/2019, 4:16 PM  [place] [address] [place], [place], [address] Phone: [phone]   Fax:  [phone]  Name: Rhonda Briggs MRN: [id number] Date of Birth: June 30, 1950

## 2019-11-07 ENCOUNTER — Other Ambulatory Visit: Payer: Self-pay | Admitting: Orthopedic Surgery

## 2019-11-08 ENCOUNTER — Encounter (HOSPITAL_COMMUNITY): Payer: Medicare Other | Admitting: Physical Therapy

## 2019-11-09 ENCOUNTER — Ambulatory Visit (INDEPENDENT_AMBULATORY_CARE_PROVIDER_SITE_OTHER): Payer: Medicare Other | Admitting: Orthopedic Surgery

## 2019-11-09 ENCOUNTER — Ambulatory Visit: Payer: Medicare Other

## 2019-11-09 ENCOUNTER — Ambulatory Visit (HOSPITAL_COMMUNITY): Payer: Medicare Other | Admitting: Physical Therapy

## 2019-11-09 ENCOUNTER — Other Ambulatory Visit: Payer: Self-pay

## 2019-11-09 VITALS — Ht 61.0 in | Wt 153.0 lb

## 2019-11-09 DIAGNOSIS — Z96651 Presence of right artificial knee joint: Secondary | ICD-10-CM

## 2019-11-09 DIAGNOSIS — M1712 Unilateral primary osteoarthritis, left knee: Secondary | ICD-10-CM

## 2019-11-09 DIAGNOSIS — M1711 Unilateral primary osteoarthritis, right knee: Secondary | ICD-10-CM

## 2019-11-09 DIAGNOSIS — M1990 Unspecified osteoarthritis, unspecified site: Secondary | ICD-10-CM | POA: Diagnosis not present

## 2019-11-09 DIAGNOSIS — M542 Cervicalgia: Secondary | ICD-10-CM

## 2019-11-09 DIAGNOSIS — R293 Abnormal posture: Secondary | ICD-10-CM

## 2019-11-09 DIAGNOSIS — Z96652 Presence of left artificial knee joint: Secondary | ICD-10-CM

## 2019-11-09 MED ORDER — PREDNISONE 10 MG PO TABS: 10.0000 mg | ORAL_TABLET | Freq: Every day | ORAL | 0 refills | Status: DC

## 2019-11-09 NOTE — Patient Instructions (Addendum)
1-2 tablets once a week of prednisone

## 2019-11-09 NOTE — Therapy (Signed)
[place], [place], [address] Phone: [phone]   Fax:  [phone]  Physical Therapy Treatment  Patient Details  Name: Rhonda Briggs MRN: [id number] Date of Birth: [birth year] Referring Provider (PT): Curtis Sites MD   Encounter Date: 11/09/2019   PT End of Session - 11/09/19 1652    Visit Number 4    Number of Visits 8    Date for PT Re-Evaluation 11/24/19    Authorization Type Medicare A    PT Start Time 1027    PT Stop Time 1623    PT Time Calculation (min) 38 min    Activity Tolerance Patient tolerated treatment well    Behavior During Therapy [facility] for tasks assessed/performed           Past Medical History:  Diagnosis Date  . Anemia   . Arthritis   . History of kidney stones   . Hypertension   . Osteoporosis   . PONV (postoperative nausea and vomiting)    after hernia surgery    Past Surgical History:  Procedure Laterality Date  . CARPAL TUNNEL RELEASE Right 2011  . HERNIA REPAIR Left    inguinal  . TOTAL KNEE ARTHROPLASTY Right 08/19/2016   Procedure: TOTAL KNEE ARTHROPLASTY;  Surgeon: Carole Civil, MD;  Location: [facility];  Service: Orthopedics;  Laterality: Right;  . TOTAL KNEE ARTHROPLASTY Left 12/31/2016   Procedure: TOTAL KNEE ARTHROPLASTY;  Surgeon: Carole Civil, MD;  Location: [facility];  Service: Orthopedics;  Laterality: Left;  . TUBAL LIGATION      There were no vitals filed for this visit.   Subjective Assessment - 11/09/19 1550    Subjective pt states she has had no pain since last weeks visit.    Currently in Pain? No/denies                             [facility] Adult PT Treatment/Exercise - 11/09/19 0001      Neck Exercises: Theraband   Shoulder Extension Red;10 reps    Rows Red;10 reps      Neck Exercises: Seated   Neck Retraction 10 reps;5 secs    X to V 10 reps    W Back 10 reps    Shoulder Shrugs 10 reps    Other Seated Exercise  scapular retraction 10 x5", 3D cervical excursions x 10 each        Manual Therapy   Manual Therapy Soft tissue mobilization;Myofascial release    Manual therapy comments Manual treatment completed separate form all other activity     Soft tissue mobilization seated to bil scaps and traps    Myofascial Release occipital release in supine 4X      Neck Exercises: Stretches   Upper Trapezius Stretch Right;Left;2 reps;30 seconds                    PT Short Term Goals - 11/01/19 1554      PT SHORT TERM GOAL #1   Title Patient will be independent with initial HEP and self-management strategies to improve functional outcomes    Time 2    Period Weeks    Status New    Target Date 11/10/19             PT Long Term Goals - 11/01/19 1554      PT LONG TERM GOAL #1   Title Patient improve RT cervical rotation  by 15 degrees in order to improve ability to scan environment for safety and while driving.    Time 4    Period Weeks    Status On-going      PT LONG TERM GOAL #2   Title Patient will report at least 75% overall improvement in subjective complaint to indicate improvement in ability to perform ADLs.    Time 4    Period Weeks    Status On-going      PT LONG TERM GOAL #3   Title Patient will be able to sit > 60 minutes with pain not to exceed 3/10 in neck to improve ability to perform ADLs, eating, resting, reading, leisure    Time 4    Period Weeks    Status On-going                 Plan - 11/09/19 1731    Clinical Impression Statement Pt returns today painfree X 1 week and no radiating symptoms into UE's.  continued with postural strengthening with addition of theraband exercises.  Slightly challenging due to difficulty grasping with Rt hand, however able to complete.  Noted improvement and increased ROM when completing seated postural exercises this session. Did require cues to sit up straight rather than leaning back towards back of chair.   Manual  continued with less tightness, however did have a trigger point in Lt upper trap which elicited radiating pain into hand.  Able to reduce this spasm but not completely resolve. No other spasms palpated.    Examination-Activity Limitations Sleep;Sit;Reach Overhead;Lift    Examination-Participation Restrictions Yard Work;Cleaning;Laundry    Stability/Clinical Decision Making Stable/Uncomplicated    Rehab Potential Good    PT Frequency 2x / week    PT Duration 4 weeks    PT Treatment/Interventions ADLs/Self Care Home Management;Aquatic Therapy;Biofeedback;Electrical Stimulation;Contrast Bath;Therapeutic exercise;Orthotic Fit/Training;Patient/family education;Therapeutic activities;Functional mobility training;Stair training;Balance training;Neuromuscular re-education;Manual techniques;Manual lymph drainage;Compression bandaging;Parrafin;Fluidtherapy;Cryotherapy;Ultrasound;Traction;Moist Heat;Iontophoresis 4mg /ml Dexamethasone;DME Instruction;Gait training;Scar mobilization;Passive range of motion;Dry needling;Energy conservation;Splinting;Taping;Vasopneumatic Device;Joint Manipulations;Spinal Manipulations    PT Next Visit Plan Continue manual and progress postural strengthening/ cervical mobility as tolerated.  Update HEP.    PT Home Exercise Plan 10/25/19: postural correction, scap retraction 10/30/19: UT stretch, chin tuck    Consulted and Agree with Plan of Care Patient           Patient will benefit from skilled therapeutic intervention in order to improve the following deficits and impairments:  Pain, Improper body mechanics, Increased fascial restricitons, Impaired flexibility, Hypomobility, Decreased range of motion, Postural dysfunction  Visit Diagnosis: Cervicalgia  Abnormal posture     Problem List Patient Active Problem List   Diagnosis Date Noted  . 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 Primary osteoarthritis 08/19/2016  . S/P TKR (total knee replacement),  right 08/19/2016   . Primary osteoarthritis of right knee    Teena Irani, PTA/CLT [phone]  Teena Irani 11/09/2019, 5:32 PM  [place] [address] [place], [place], [address] Phone: [phone]   Fax:  [phone]  Name: Jobina Maita MRN: [id number] Date of Birth: 03-Aug-1950

## 2019-11-09 NOTE — Progress Notes (Signed)
Chief Complaint  Patient presents with  . Follow-up    Recheck on bilateral knees left 12-31-16, right 08-19-16.   Annual follow-up bilateral knees both done in 2018 patient complains of pain all over  Has multiple joint aches and pains no trauma  Knees are functioning well other than when it is colder weather changes  Full extension on both knees both knees are stable flexion arc is 120 degrees both x-rays show stable implants  Patient will be put on a low-dose prednisone once a week to address her multiple joint aches and pains  Follow-up in a year repeat both films  Encounter Diagnoses  Name Primary?  . S/P total knee replacement, left 12/31/16   . 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 Primary osteoarthritis of right knee   . Unilateral primary osteoarthritis, left knee   . Chronic inflammatory arthritis Yes    Meds ordered this encounter  Medications  . predniSONE (DELTASONE) 10 MG tablet    Sig: Take 1 tablet (10 mg total) by mouth daily.    Dispense:  60 tablet    Refill:  0

## 2019-11-13 ENCOUNTER — Telehealth: Payer: Self-pay | Admitting: Orthopedic Surgery

## 2019-11-13 NOTE — Telephone Encounter (Signed)
Patient will be put on a low-dose prednisone once a week to address her multiple joint aches and pains The rx is written for once daily she just wanted to double check  I told her once weekly per Dr Althia Forts note  To you FYI

## 2019-11-13 NOTE — Telephone Encounter (Signed)
Patient has a question regarding the Prednisone  She wants for you to call her please

## 2019-11-15 ENCOUNTER — Ambulatory Visit (HOSPITAL_COMMUNITY): Payer: Medicare Other | Admitting: Physical Therapy

## 2019-11-17 ENCOUNTER — Ambulatory Visit (HOSPITAL_COMMUNITY): Payer: Medicare Other

## 2019-11-21 ENCOUNTER — Encounter (HOSPITAL_COMMUNITY): Payer: Medicare Other

## 2019-11-23 ENCOUNTER — Encounter (HOSPITAL_COMMUNITY): Payer: Medicare Other | Admitting: Physical Therapy

## 2019-12-15 ENCOUNTER — Institutional Professional Consult (permissible substitution): Payer: Medicare Other | Admitting: Internal Medicine

## 2019-12-25 ENCOUNTER — Other Ambulatory Visit: Payer: Self-pay

## 2019-12-25 ENCOUNTER — Ambulatory Visit (INDEPENDENT_AMBULATORY_CARE_PROVIDER_SITE_OTHER): Payer: Medicare Other | Admitting: Internal Medicine

## 2019-12-25 ENCOUNTER — Encounter: Payer: Self-pay | Admitting: Internal Medicine

## 2019-12-25 DIAGNOSIS — M0510 Rheumatoid lung disease with rheumatoid arthritis of unspecified site: Secondary | ICD-10-CM | POA: Diagnosis not present

## 2019-12-25 DIAGNOSIS — F17210 Nicotine dependence, cigarettes, uncomplicated: Secondary | ICD-10-CM | POA: Diagnosis not present

## 2019-12-25 NOTE — Assessment & Plan Note (Signed)
Counseled re importance of smoking cessation but did not meet time criteria for separate billing           Each maintenance medication was reviewed in detail including emphasizing most importantly the difference between maintenance and prns and under what circumstances the prns are to be triggered using an action plan format where appropriate.  Total time for H and P, chart review, counseling,   directly observing portions of ambulatory 02 saturation study/ and generating customized AVS unique to this office visit / charting  > 60 min

## 2019-12-25 NOTE — Patient Instructions (Addendum)
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   We will try to arrange for you to have a CT chest/ echocardiogram and PFTs prior to your next visit with me in about 6 weeks

## 2019-12-25 NOTE — Assessment & Plan Note (Signed)
Onset symptoms around 2017/18 - HRCT 01/04/2018 Imaging findings suggestive of interstitial lung disease, as above. At this time, based on current ATS guidelines, findings are considered indeterminate for usual interstitial pneumonia (UIP), however, given the lack of a craniocaudal gradient and sparing of the lung bases, findings are favored to reflect an alternative Diagnosis -  12/25/2019   Walked RA  approx   300 ft  @ slow pace  stopped due to  Sob with sats still 94%     DDx for pulmonary fibrosis  includes idiopathic pulmonary fibrosis, pulmonary fibrosis associated with rheumatologic diseases (which have a relatively benign course in most cases) , adverse effect from  drugs such as chemotherapy or amiodarone exposure, nonspecific interstitial pneumonia which is typically steroid responsive, and chronic hypersensitivity pneumonitis.   In active  smokers Langerhan's Cell  Histiocyctosis (eosinophilic granuomatosis),  DIP,  and Respiratory Bronchiolitis ILD also need to be considered also but less likely than RA lung dz   Key here is to control underlying RA, advised will need rheum eval   >>> f/u here in 6 weeks with HRCT, pfts and ECHO as has calcified Aortic and MV on CT with symptoms overlapping ILD/copd and valvular ht dz

## 2019-12-25 NOTE — Progress Notes (Signed)
Rhonda Briggs, female    DOB: Nov 28, 1950   MRN: [id number]   Brief patient profile:  32 yowf active smoker with likely RA since 2017/18 (never seen rheumatogist/just ortho)  with onset new sob/cough assoc with "drainage" sensation since around 2018 / 2019 > w/u pfts and hrct but never saw specialist and now back in [place] since June 2020 and rx with prednisone by orthopedic since sept 2021 for ? RA and albuterol once a week at most and referred to pulmonary clinic 12/25/2019 by Zhour-Talbert MD.      History of Present Illness  12/25/2019  Pulmonary/ 1st office eval/ Rhonda Briggs / [place] / rx for incruse but not using  Chief Complaint  Patient presents with  . Consult    Patient has shortness of breath with exertion and then her back hurts and makes its worse and she has to sit down. Patient has productive cough with beige color sputum.  Dyspnea:  Limited by back > breathing since years  Cough: due to drainage/ worse in am's all mucoid sputum  Sleep: fine one pillow bed is flat  SABA use: albuterol   No obvious day to day or daytime variability or assoc excess/ purulent sputum or mucus plugs or hemoptysis or cp or chest tightness, subjective wheeze or overt sinus or hb symptoms.   sleeping without nocturnal   exacerbation  of respiratory  c/o's or need for noct saba. Also denies any obvious fluctuation of symptoms with weather or environmental changes or other aggravating or alleviating factors except as outlined above   No unusual exposure hx or h/o childhood pna/ asthma or knowledge of premature birth.  Current Allergies, Complete Past Medical History, Past Surgical History, Family History, and Social History were reviewed in [employer] record.  ROS  The following are not active complaints unless bolded Hoarseness, sore throat, dysphagia, dental problems, itching, sneezing,  nasal congestion or discharge of excess mucus or purulent secretions, ear ache,    fever, chills, sweats, unintended wt loss or wt gain, classically pleuritic or exertional cp,  orthopnea pnd or arm/hand swelling  or leg swelling, presyncope, palpitations, abdominal pain, anorexia, nausea, vomiting, diarrhea  or change in bowel habits or change in bladder habits, change in stools or change in urine, dysuria, hematuria,  rash, arthralgias, visual complaints, headache, numbness, weakness or ataxia or problems with walking or coordination,  change in mood or  memory.           Past Medical History:  Diagnosis Date  . Anemia   . Arthritis   . History of kidney stones   . Hypertension   . Osteoporosis   . PONV (postoperative nausea and vomiting)    after hernia surgery    Outpatient Medications Prior to Visit  Medication Sig Dispense Refill  . amoxicillin (AMOXIL) 500 MG capsule Take 4 capsules (2,000 mg total) by mouth once as needed for up to 1 dose. 4 capsule 2  . celecoxib (CELEBREX) 200 MG capsule TAKE 1 CAPSULE BY MOUTH ONCE DAILY WITH FOOD 30 capsule 0  . ferrous sulfate (FEOSOL) 325 (65 FE) MG tablet Take 1 tablet (325 mg total) by mouth 3 (three) times daily with meals. 90 tablet 0  . gabapentin (NEURONTIN) 100 MG capsule Take 1 capsule (100 mg total) by mouth 3 (three) times daily. 180 capsule 2  . hydrochlorothiazide (HYDRODIURIL) 25 MG tablet Take 25 mg by mouth daily.     . pramipexole (MIRAPEX) 0.125 MG tablet Take 0.25  mg by mouth at bedtime.     . predniSONE (DELTASONE) 10 MG tablet Take 1 tablet (10 mg total) by mouth daily. 60 tablet 0  . VENTOLIN HFA 108 (90 Base) MCG/ACT inhaler     .           Objective:     BP (!) 150/100 (BP Location: Left Arm, Patient Position: Sitting, Cuff Size: Normal) Comment: patient states she took BP meds this morning  Pulse (!) 102   Temp 97.7 F (36.5 C) (Temporal)   Ht 5\' 1"  (1.549 m)   Wt 153 lb (69.4 kg)   SpO2 97%   BMI 28.91 kg/m   SpO2: 97 %   Elderly wf slow gait     HEENT : pt wearing mask not  removed for exam due to covid - 19 concerns.   NECK :  without JVD/Nodes/TM/ nl carotid upstrokes bilaterally   LUNGS: no acc muscle use,  Min barrel  contour chest wall with bilateral  slightly decreased bs s audible wheeze and  without cough on insp or exp maneuvers and min  Hyperresonant  to  percussion bilaterally     CV:  RRR  no s3 or murmur or increase in P2, and no edema   ABD:  soft and nontender with pos end  insp Hoover's  in the supine position. No bruits or organomegaly appreciated, bowel sounds nl  MS:  slow gait/  ext warm with classic RA deformities MCPs bilaterally with ulnar deviation, calf tenderness, cyanosis or clubbing No obvious joint restrictions   SKIN: warm and dry without lesions    NEURO:  alert, approp, nl sensorium with  no motor or cerebellar deficits apparent.           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 At this time, based on current ATS guidelines, findings are considered indeterminate for usual interstitial pneumonia (UIP), however, given the lack of a craniocaudal gradient and sparing of the lung bases, findings are favored to reflect an alternative Diagnosis -  12/25/2019   Walked RA  approx   300 ft  @ slow pace  stopped due to  Sob with sats still 94%     DDx for pulmonary fibrosis  includes idiopathic pulmonary fibrosis, pulmonary fibrosis associated with rheumatologic diseases (which have a relatively benign course in most cases) , adverse effect from  drugs such as chemotherapy or amiodarone exposure, nonspecific interstitial pneumonia which is typically steroid responsive, and chronic hypersensitivity pneumonitis.   In active  smokers Langerhan's Cell  Histiocyctosis (eosinophilic granuomatosis),  DIP,  and Respiratory Bronchiolitis ILD also need to be considered also but less likely than RA lung dz   Key here is  to control underlying RA, advised will need rheum eval   >>> f/u here in 6 weeks with HRCT, pfts and ECHO as has calcified Aortic and MV on CT with symptoms overlapping ILD/copd and valvular ht dz     Cigarette smoker Counseled re importance of smoking cessation but did not meet time criteria for separate billing       Each maintenance medication was reviewed in detail including emphasizing most importantly the difference between maintenance and prns and under what circumstances the prns are to be triggered using an action plan format where appropriate.  Total time for H and P, chart review, counseling,   directly observing portions of ambulatory 02 saturation study/  and generating customized AVS unique to this office visit / charting  > 60 min           Christinia Gully, MD 12/25/2019

## 2020-01-31 ENCOUNTER — Telehealth: Payer: Self-pay | Admitting: Internal Medicine

## 2020-01-31 DIAGNOSIS — R0600 Dyspnea, unspecified: Secondary | ICD-10-CM

## 2020-01-31 DIAGNOSIS — M0510 Rheumatoid lung disease with rheumatoid arthritis of unspecified site: Secondary | ICD-10-CM

## 2020-01-31 DIAGNOSIS — R0609 Other forms of dyspnea: Secondary | ICD-10-CM

## 2020-01-31 DIAGNOSIS — F17210 Nicotine dependence, cigarettes, uncomplicated: Secondary | ICD-10-CM

## 2020-01-31 NOTE — Telephone Encounter (Signed)
Called and spoke with patient to let her know that orders would be placed and checked on and we would be in touch with her to get them scheduled when we could. She expressed understanding. Teams message sent to [facility].  Nothing further needed at this time.

## 2020-02-02 ENCOUNTER — Other Ambulatory Visit (HOSPITAL_COMMUNITY)
Admission: RE | Admit: 2020-02-02 | Discharge: 2020-02-02 | Disposition: A | Discharge status: Home / Self Care | Payer: Medicare Other | Source: Ambulatory Visit | Attending: Pulmonary Disease | Admitting: Pulmonary Disease

## 2020-02-02 ENCOUNTER — Other Ambulatory Visit: Payer: Self-pay

## 2020-02-02 DIAGNOSIS — Z20822 Contact with and (suspected) exposure to covid-19: Secondary | ICD-10-CM | POA: Insufficient documentation

## 2020-02-02 DIAGNOSIS — Z01812 Encounter for preprocedural laboratory examination: Secondary | ICD-10-CM | POA: Insufficient documentation

## 2020-02-03 LAB — SARS CORONAVIRUS 2 (TAT 6-24 HRS): SARS Coronavirus 2: NEGATIVE

## 2020-02-05 ENCOUNTER — Telehealth: Payer: Self-pay | Admitting: Family Medicine

## 2020-02-05 NOTE — Telephone Encounter (Signed)
Pre-cert Verification for the following procedure    ECHO    DATE:    02/06/2020  LOCATION: [facility]

## 2020-02-06 ENCOUNTER — Ambulatory Visit (HOSPITAL_BASED_OUTPATIENT_CLINIC_OR_DEPARTMENT_OTHER)
Admission: RE | Admit: 2020-02-06 | Discharge: 2020-02-06 | Disposition: A | Discharge status: Home / Self Care | Payer: Medicare Other | Source: Ambulatory Visit | Attending: Pulmonary Disease | Admitting: Pulmonary Disease

## 2020-02-06 ENCOUNTER — Ambulatory Visit (HOSPITAL_COMMUNITY)
Admission: RE | Admit: 2020-02-06 | Discharge: 2020-02-06 | Disposition: A | Discharge status: Home / Self Care | Payer: Medicare Other | Source: Ambulatory Visit | Attending: Pulmonary Disease | Admitting: Pulmonary Disease

## 2020-02-06 ENCOUNTER — Other Ambulatory Visit: Payer: Self-pay

## 2020-02-06 DIAGNOSIS — R0600 Dyspnea, unspecified: Secondary | ICD-10-CM | POA: Insufficient documentation

## 2020-02-06 DIAGNOSIS — M0510 Rheumatoid lung disease with rheumatoid arthritis of unspecified site: Secondary | ICD-10-CM

## 2020-02-06 DIAGNOSIS — R0609 Other forms of dyspnea: Secondary | ICD-10-CM | POA: Diagnosis not present

## 2020-02-06 DIAGNOSIS — I700 Atherosclerosis of aorta: Secondary | ICD-10-CM | POA: Insufficient documentation

## 2020-02-06 DIAGNOSIS — J449 Chronic obstructive pulmonary disease, unspecified: Secondary | ICD-10-CM | POA: Diagnosis not present

## 2020-02-06 DIAGNOSIS — F17210 Nicotine dependence, cigarettes, uncomplicated: Secondary | ICD-10-CM | POA: Insufficient documentation

## 2020-02-06 DIAGNOSIS — I10 Essential (primary) hypertension: Secondary | ICD-10-CM | POA: Diagnosis not present

## 2020-02-06 LAB — ECHOCARDIOGRAM COMPLETE
AR max vel: 3.04 cm2
AV Area VTI: 2.74 cm2
AV Area mean vel: 2.72 cm2
AV Mean grad: 9 mmHg
AV Peak grad: 12.8 mmHg
Ao pk vel: 1.79 m/s
Area-P 1/2: 3.65 cm2
S' Lateral: 2.5 cm

## 2020-02-06 LAB — PULMONARY FUNCTION TEST
DL/VA % pred: 67 %
DL/VA: 2.88 ml/min/mmHg/L
DLCO unc % pred: 68 %
DLCO unc: 12.1 ml/min/mmHg
FEF 25-75 Post: 0.48 L/sec
FEF 25-75 Pre: 0.5 L/sec
FEF2575-%Change-Post: -5 %
FEF2575-%Pred-Post: 26 %
FEF2575-%Pred-Pre: 28 %
FEV1-%Change-Post: -2 %
FEV1-%Pred-Post: 52 %
FEV1-%Pred-Pre: 53 %
FEV1-Post: 1.05 L
FEV1-Pre: 1.08 L
FEV1FVC-%Change-Post: -2 %
FEV1FVC-%Pred-Pre: 75 %
FEV6-%Change-Post: 1 %
FEV6-%Pred-Post: 72 %
FEV6-%Pred-Pre: 71 %
FEV6-Post: 1.85 L
FEV6-Pre: 1.83 L
FEV6FVC-%Change-Post: 1 %
FEV6FVC-%Pred-Post: 102 %
FEV6FVC-%Pred-Pre: 101 %
FVC-%Change-Post: 0 %
FVC-%Pred-Post: 70 %
FVC-%Pred-Pre: 70 %
FVC-Post: 1.89 L
FVC-Pre: 1.89 L
Post FEV1/FVC ratio: 56 %
Post FEV6/FVC ratio: 98 %
Pre FEV1/FVC ratio: 57 %
Pre FEV6/FVC Ratio: 96 %
RV % pred: 253 %
RV: 5.12 L
TLC % pred: 154 %
TLC: 7.13 L

## 2020-02-06 MED ORDER — ALBUTEROL SULFATE (2.5 MG/3ML) 0.083% IN NEBU
2.5000 mg | INHALATION_SOLUTION | Freq: Once | RESPIRATORY_TRACT | Status: AC
  Administered 2020-02-06: 2.5 mg via RESPIRATORY_TRACT

## 2020-02-06 NOTE — Progress Notes (Signed)
*  PRELIMINARY RESULTS* Echocardiogram 2D Echocardiogram has been performed.  Samuel Germany 02/06/2020, 11:35 AM

## 2020-02-07 ENCOUNTER — Encounter: Payer: Self-pay | Admitting: *Deleted

## 2020-02-15 ENCOUNTER — Encounter: Payer: Self-pay | Admitting: Internal Medicine

## 2020-02-15 DIAGNOSIS — J441 Chronic obstructive pulmonary disease with (acute) exacerbation: Secondary | ICD-10-CM | POA: Insufficient documentation

## 2020-02-15 DIAGNOSIS — J449 Chronic obstructive pulmonary disease, unspecified: Secondary | ICD-10-CM | POA: Insufficient documentation

## 2020-02-19 ENCOUNTER — Telehealth: Payer: Self-pay | Admitting: Internal Medicine

## 2020-02-19 NOTE — Telephone Encounter (Signed)
Patient made aware of previous results from Dr. Melvyn Novas  And OV made to discuss results in detail and bring all meds to office visit .  03/05/20 at 1130 .

## 2020-02-22 ENCOUNTER — Ambulatory Visit (HOSPITAL_COMMUNITY)
Admission: RE | Admit: 2020-02-22 | Discharge: 2020-02-22 | Disposition: A | Discharge status: Home / Self Care | Payer: Medicare Other | Source: Ambulatory Visit | Attending: Pulmonary Disease | Admitting: Pulmonary Disease

## 2020-02-22 ENCOUNTER — Other Ambulatory Visit: Payer: Self-pay

## 2020-02-22 DIAGNOSIS — M0510 Rheumatoid lung disease with rheumatoid arthritis of unspecified site: Secondary | ICD-10-CM | POA: Diagnosis not present

## 2020-02-26 ENCOUNTER — Encounter: Payer: Self-pay | Admitting: Internal Medicine

## 2020-02-26 DIAGNOSIS — E041 Nontoxic single thyroid nodule: Secondary | ICD-10-CM | POA: Insufficient documentation

## 2020-02-28 ENCOUNTER — Other Ambulatory Visit: Payer: Self-pay | Admitting: Internal Medicine

## 2020-02-28 ENCOUNTER — Telehealth: Payer: Self-pay | Admitting: Internal Medicine

## 2020-02-28 DIAGNOSIS — E041 Nontoxic single thyroid nodule: Secondary | ICD-10-CM

## 2020-02-28 NOTE — Progress Notes (Signed)
Spoke with pt and notified of results per Dr. Melvyn Novas. Pt verbalized understanding and denied any questions. She was agreeable to [place] and prefers we follow. I have placed the order for this.

## 2020-02-29 NOTE — Telephone Encounter (Signed)
Per pt's chart, Judeen Hammans with [facility] called to schedule pt's thyroid US. Pt has already been given information and spoken to today. Will close encounter.

## 2020-03-05 ENCOUNTER — Other Ambulatory Visit: Payer: Self-pay

## 2020-03-05 ENCOUNTER — Encounter: Payer: Self-pay | Admitting: Internal Medicine

## 2020-03-05 ENCOUNTER — Ambulatory Visit (INDEPENDENT_AMBULATORY_CARE_PROVIDER_SITE_OTHER): Payer: Medicare Other | Admitting: Internal Medicine

## 2020-03-05 DIAGNOSIS — F17210 Nicotine dependence, cigarettes, uncomplicated: Secondary | ICD-10-CM

## 2020-03-05 DIAGNOSIS — M0510 Rheumatoid lung disease with rheumatoid arthritis of unspecified site: Secondary | ICD-10-CM

## 2020-03-05 DIAGNOSIS — J449 Chronic obstructive pulmonary disease, unspecified: Secondary | ICD-10-CM | POA: Diagnosis not present

## 2020-03-05 DIAGNOSIS — E041 Nontoxic single thyroid nodule: Secondary | ICD-10-CM

## 2020-03-05 NOTE — Progress Notes (Signed)
Rhonda Briggs, female    DOB: October 25, 1950   MRN: [id number]   Brief patient profile:  71 yowf active smoker with likely RA since 2017/18 (had  never seen rheumatogist/just ortho)  with onset new sob/cough assoc with "drainage" sensation since around 2018 / 2019 > w/u pfts and hrct but never saw specialist and now back in [place] since June 2020 and rx with prednisone by orthopedic since sept 2021 for ? RA and albuterol once a week at most and referred to pulmonary clinic 12/25/2019 by Zhour-Talbert MD.      History of Present Illness  12/25/2019  Pulmonary/ 1st office eval/ Shundra Wirsing / [place] / rx for incruse but not using  Chief Complaint  Patient presents with  . Consult    Patient has shortness of breath with exertion and then her back hurts and makes its worse and she has to sit down. Patient has productive cough with beige color sputum.  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  We will try to arrange for you to have a CT chest/ echocardiogram and PFTs prior to your next visit with me in about 6 weeks     03/05/2020  f/u ov/[place] office/Kalub Morillo re: doe / still smoking  Chief Complaint  Patient presents with  . Follow-up    Productive cough with cream colored phlegm  Dyspnea:  [employer], can't do mall = MMRC3 = can't walk 100 yards  even at a slow pace at a flat grade s stopping due to  Cough: minimal mucoid Sleeping: bed is flat, one pillow no am exac  SABA use: bid but not really helping / never pre challenges 02: none  Covid status: vax x 2, last 03/15/19      No obvious day to day or daytime variability or assoc excess/ purulent sputum or mucus plugs or hemoptysis or cp or chest tightness, subjective wheeze or overt sinus or hb symptoms.   Sleeping  without nocturnal  or early am exacerbation  of respiratory  c/o's or need for noct saba. Also denies any obvious fluctuation of symptoms with weather or environmental changes or other aggravating or alleviating factors except as outlined above   No unusual exposure hx or h/o childhood pna/ asthma or knowledge of premature birth.  Current Allergies, Complete Past Medical History, Past Surgical History, Family History, and Social History were reviewed in [employer] record.  ROS  The following are not active complaints unless bolded Hoarseness, sore throat, dysphagia, dental problems, itching, sneezing,  nasal congestion or discharge of excess mucus or purulent secretions, ear ache,   fever, chills, sweats, unintended wt loss or wt gain, classically pleuritic or exertional cp,  orthopnea pnd or arm/hand swelling  or leg swelling, presyncope,  palpitations, abdominal pain, anorexia, nausea, vomiting, diarrhea  or change in bowel habits or change in bladder habits, change in stools or change in urine, dysuria, hematuria,  rash, arthralgias, visual complaints, headache, numbness, weakness or ataxia or problems with walking or coordination,  change in mood or  memory.        Current Meds  Medication Sig  . Ascorbic Acid (VITAMIN C) 1000 MG tablet Take 1,000 mg by mouth daily.  . calcium gluconate 500 MG tablet Take 1 tablet by mouth 2 (two) times daily.  . cholecalciferol (VITAMIN D) 25 MCG (1000 UNIT) tablet Take 1,000 Units by mouth daily. Takes 2 a day   . ergocalciferol (VITAMIN D2) 1.25 MG (50000 UT) capsule Take 50,000 Units by mouth once a week.  . ferrous sulfate (FEOSOL) 325 (65 FE) MG tablet Take 1 tablet (325 mg total) by mouth 3 (three) times daily with meals.  . folic acid (FOLVITE) 1 MG tablet Take 1 mg by mouth daily.  Marland Kitchen gabapentin (NEURONTIN) 100 MG capsule Take 1 capsule (100 mg total) by mouth 3 (three) times daily.  . hydrochlorothiazide (HYDRODIURIL) 25 MG tablet Take 25 mg by mouth daily.   . methotrexate 2.5 MG tablet Take 2.5 mg by mouth once a week. Caution:Chemotherapy. Protect from light. Takes 5 tablets every Friday night before bedtime  . pramipexole (MIRAPEX) 0.125 MG tablet Take 0.25 mg by mouth at bedtime.   . pravastatin (PRAVACHOL) 20 MG tablet Take 20 mg by mouth daily.  . predniSONE (DELTASONE) 10 MG tablet Take 1 tablet (10 mg total) by mouth daily.  Enid Cutter HFA 108 (90 Base) MCG/ACT inhaler                     Past Medical History:  Diagnosis Date  . Anemia   . Arthritis   . History of kidney stones   . Hypertension   . Osteoporosis   . PONV (postoperative nausea and vomiting)    after hernia surgery         Objective:     Wt Readings from Last 3 Encounters:  03/05/20 160 lb 6.4 oz (72.8 kg)  12/25/19 153 lb (69.4 kg)  11/09/19 153 lb (69.4 kg)      Vital signs reviewed  03/05/2020  - Note at rest 02 sats  96% on RA    General appearance:    Ambulatory wf  nad   HEENT : pt wearing mask not removed for exam due to covid - 19 concerns.    NECK :  without JVD/Nodes/TM/ nl carotid upstrokes bilaterally   LUNGS: no acc muscle use,  Mild barrel  contour chest wall with bilateral  Distant bs s audible wheeze and  without cough on insp or exp maneuvers  and mild  Hyperresonant  to  percussion bilaterally     CV:  RRR  no s3 or murmur or increase in P2, and no edema   ABD:  soft and nontender with pos end  insp Hoover's  in the supine position. No bruits or organomegaly appreciated,  bowel sounds nl  MS:   Nl gait/  ext warm with  RA changes both hands/no calf tenderness, cyanosis or clubbing No obvious joint restrictions   SKIN: warm and dry without lesions    NEURO:  alert, approp, nl sensorium with  no motor or cerebellar deficits apparent.  Assessment

## 2020-03-05 NOTE — Assessment & Plan Note (Signed)
Detected on CT chest 02/22/20 > rec u/s per guidelines  Scheduled for 03/06/20   Each maintenance medication was reviewed in detail including emphasizing most importantly the difference between maintenance and prns and under what circumstances the prns are to be triggered using an action plan format where appropriate.  Total time for H and P, chart review, counseling, reviewing use of saba device(s) , directly observing portions of ambulatory 02 saturation study/ and generating customized AVS unique to this office visit / same day charting = 22 min

## 2020-03-05 NOTE — Assessment & Plan Note (Signed)
Active smoker PFT's  02/06/20  FEV1 1.08 (53 % ) ratio 0.57  p 0 % improvement from saba p 0 prior to study with DLCO  12.40 (68%) corrects to 2.88 (67%)  for alv volume and FV curve mod concavity  - Echo  02/06/20 ok/ no significant cor pulmonale   As I explained to this patient in detail:  although there iscopd present, it may not be clinically relevant:   it does not appear to be limiting activity tolerance and I don't recommend aggressive pulmonary rx at this point unless limiting symptoms arise or acute exacerbations become as issue, neither of which is the case now.  I asked the patient to contact this office at any time in the future should either of these problems arise.    Re saba: I spent extra time with pt today reviewing appropriate use of albuterol for prn use on exertion with the following points: 1) saba is for relief of sob that does not improve by walking a slower pace or resting but rather if the pt does not improve after trying this first. 2) If the pt is convinced, as many are, that saba helps recover from activity faster then it's easy to tell if this is the case by re-challenging : ie stop, take the inhaler, then p 5 minutes try the exact same activity (intensity of workload) that just caused the symptoms and see if they are substantially diminished or not after saba 3) if there is an activity that reproducibly causes the symptoms, try the saba 15 min before the activity on alternate days   If in fact the saba really does help, then fine to continue to use it prn but advised may need to look closer at the maintenance regimen being used to achieve better control of airways disease with exertion.

## 2020-03-05 NOTE — Patient Instructions (Signed)
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      Make sure you check your oxygen saturation at your highest level of activity to be sure it stays over 90% and keep track of it at least once a week, more often if breathing getting worse, and let me know if losing ground.    Please schedule a follow up visit in 6 months but call sooner if needed

## 2020-03-05 NOTE — Assessment & Plan Note (Signed)
4-5 min discussion re active cigarette smoking in addition to office E&M  Ask about tobacco use:   ongoing Advise quitting:   Only proven way to bend the curve of  progressive dz Assess willingness:  Not committed at this point Assist in quit attempt:  Per PCP when ready Arrange follow up:   Follow up per Primary Care planned

## 2020-03-05 NOTE — Assessment & Plan Note (Signed)
Onset symptoms around 2017/18 - HRCT 01/04/2018 Imaging findings suggestive of interstitial lung disease, as above. At this time, based on current ATS guidelines, findings are considered indeterminate for usual interstitial pneumonia (UIP), however, given the lack of a craniocaudal gradient and sparing of the lung bases, findings are favored to reflect an alternative diagnosis  -  12/25/2019   Walked RA  approx   300 ft  @ slow pace  stopped due to  Sob with sats still 94% -  Echo 02/06/20   Ok  CT 02/22/20: 1. There is again seen some evidence of irregular peripheral interstitial opacity and bronchiolectasis particularly in the anterior upper lobes, however these findings are somewhat difficult to distinguish from underlying paraseptal emphysema. There is no evidence of fibrotic interstitial lung disease at the lung bases. 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 2. Moderate centrilobular and paraseptal emphysema. Diffuse bilateral bronchial wall thickening, consistent with nonspecific infectious or inflammatory bronchitis. 3. Exophytic nodule posterior to the left lobe of the thyroid measuring at least 3.1 x 2.0 cm. Recommend thyroid US > rec 02/26/2020  (see sep a/p)  03/05/2020   Walked RA  approx   600 ft  @ moderately fast pace  stopped due to  End of study, sob but sats still 96%   No evidence of dz progression  / advised: Make sure you check your oxygen saturation at your highest level of activity to be sure it stays over 90% and keep track of it at least once a week, more often if breathing getting worse, and let me know if losing ground.   Key to rx is control of RA

## 2020-03-06 ENCOUNTER — Ambulatory Visit (HOSPITAL_COMMUNITY): Payer: Medicare Other

## 2020-03-06 ENCOUNTER — Other Ambulatory Visit: Payer: Self-pay | Admitting: Orthopedic Surgery

## 2020-03-06 DIAGNOSIS — M5410 Radiculopathy, site unspecified: Secondary | ICD-10-CM

## 2020-03-11 ENCOUNTER — Other Ambulatory Visit: Payer: Medicare Other

## 2020-03-14 ENCOUNTER — Other Ambulatory Visit: Payer: Self-pay

## 2020-03-14 ENCOUNTER — Ambulatory Visit (HOSPITAL_COMMUNITY)
Admission: RE | Admit: 2020-03-14 | Discharge: 2020-03-14 | Disposition: A | Discharge status: Home / Self Care | Payer: Medicare Other | Source: Ambulatory Visit | Attending: Internal Medicine | Admitting: Internal Medicine

## 2020-03-14 DIAGNOSIS — E041 Nontoxic single thyroid nodule: Secondary | ICD-10-CM | POA: Diagnosis present

## 2020-03-18 NOTE — Progress Notes (Signed)
Spoke with pt and notified of results/recs per Dr. Melvyn Novas. Pt verbalized understanding. She states that she prefers to not do any further workup on this for now "it doesn't bother me". She stated that she would call us or PCP if she changes her mind. Forwarding to Dr Melvyn Novas and Dr Ludger Nutting as Juluis Rainier.

## 2020-04-29 ENCOUNTER — Other Ambulatory Visit: Payer: Self-pay | Admitting: Orthopedic Surgery

## 2020-04-29 DIAGNOSIS — M5410 Radiculopathy, site unspecified: Secondary | ICD-10-CM

## 2020-07-26 ENCOUNTER — Other Ambulatory Visit: Payer: Self-pay | Admitting: Orthopedic Surgery

## 2020-07-26 DIAGNOSIS — M5410 Radiculopathy, site unspecified: Secondary | ICD-10-CM

## 2020-09-05 ENCOUNTER — Encounter: Payer: Self-pay | Admitting: Internal Medicine

## 2020-09-05 ENCOUNTER — Other Ambulatory Visit: Payer: Self-pay

## 2020-09-05 ENCOUNTER — Ambulatory Visit (INDEPENDENT_AMBULATORY_CARE_PROVIDER_SITE_OTHER): Payer: Medicare Other | Admitting: Internal Medicine

## 2020-09-05 VITALS — BP 128/68 | HR 90 | Ht 60.0 in | Wt 166.0 lb

## 2020-09-05 DIAGNOSIS — M0510 Rheumatoid lung disease with rheumatoid arthritis of unspecified site: Secondary | ICD-10-CM

## 2020-09-05 DIAGNOSIS — E041 Nontoxic single thyroid nodule: Secondary | ICD-10-CM | POA: Diagnosis not present

## 2020-09-05 DIAGNOSIS — J449 Chronic obstructive pulmonary disease, unspecified: Secondary | ICD-10-CM

## 2020-09-05 NOTE — Assessment & Plan Note (Signed)
Active smoker PFT's  02/06/20  FEV1 1.08 (53 % ) ratio 0.57  p 0 % improvement from saba p 0 prior to study with DLCO  12.40 (68%) corrects to 2.88 (67%)  for alv volume and FV curve mod concavity  - Echo  02/06/20 ok/ no significant cor pulmonale  - 09/05/2020  After extensive coaching inhaler device,  effectiveness =    75% (short ti)   She has mostly reprodicuble doe "better p saba"  And while on prednisone for RA does not tend to have aecopd so offered start on lama/laba but declined  Re saba use: I spent extra time with pt today reviewing appropriate use of albuterol for prn use on exertion with the following points: 1) saba is for relief of sob that does not improve by walking a slower pace or resting but rather if the pt does not improve after trying this first. 2) If the pt is convinced, as many are, that saba helps recover from activity faster then it's easy to tell if this is the case by re-challenging : ie stop, take the inhaler, then p 5 minutes try the exact same activity (intensity of workload) that just caused the symptoms and see if they are substantially diminished or not after saba 3) if there is an activity that reproducibly causes the symptoms, try the saba 15 min before the activity on alternate days   If in fact the saba really does help, then fine to continue to use it prn but advised may need to look closer at the maintenance regimen being used to achieve better control of airways disease with exertion (bevespi if prefers hfa)

## 2020-09-05 NOTE — Assessment & Plan Note (Signed)
Onset symptoms around 2017/18 - HRCT 01/04/2018 Imaging findings suggestive of interstitial lung disease, as above. At this time, based on current ATS guidelines, findings are considered indeterminate for usual interstitial pneumonia (UIP), however, given the lack of a craniocaudal gradient and sparing of the lung bases, findings are favored to reflect an alternative diagnosis  -  12/25/2019   Walked RA  approx   300 ft  @ slow pace  stopped due to  Sob with sats still 94% -  Echo 02/06/20   Ok  CT 02/22/20: 1. There is again seen some evidence of irregular peripheral interstitial opacity and bronchiolectasis particularly in the anterior upper lobes, however these findings are somewhat difficult to distinguish from underlying paraseptal emphysema. There is no evidence of fibrotic interstitial lung disease at the lung bases. 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 2. Moderate centrilobular and paraseptal emphysema. Diffuse bilateral bronchial wall thickening, consistent with nonspecific infectious or inflammatory bronchitis. 3. Exophytic nodule posterior to the left lobe of the thyroid measuring at least 3.1 x 2.0 cm. Recommend thyroid US   03/05/2020   Walked RA  approx   600 ft  @ moderately fast pace  stopped due to  End of study, sob but sats still 96%    -  09/05/2020   Walked RA  3 laps @ approx 162f each @ mod  pace  stopped due to end of study mild sob at end with lowest sats = 93%  Key is to control systemic RA, no change needed in resp rx

## 2020-09-05 NOTE — Progress Notes (Signed)
Rhonda Briggs, female    DOB: May 30, 1950   MRN: [id number]   Brief patient profile:  27 yowf active smoker with likely RA since 2017/18 (had  never seen rheumatogist/just ortho)  with onset new sob/cough assoc with "drainage" sensation since around 2018 / 2019 > w/u pfts and hrct but never saw specialist and now back in [place] since June 2020 and rx with prednisone by orthopedic since sept 2021 for ? RA and albuterol once a week at most and referred to pulmonary clinic 12/25/2019 by Zhour-Talbert MD.      History of Present Illness  12/25/2019  Pulmonary/ 1st office eval/ Rechelle Niebla / [place] / rx for incruse but not using  Chief Complaint  Patient presents with   Consult    Patient has shortness of breath with exertion and then her back hurts and makes its worse and she has to sit down. Patient has productive cough with beige color sputum.  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  We will try to arrange for you to have a CT chest/ echocardiogram and PFTs prior to your next visit with me in about 6 weeks     03/05/2020  f/u ov/[place] office/Lyzette Reinhardt re: doe / still smoking  Chief Complaint  Patient presents with   Follow-up    Productive cough with cream colored phlegm  Dyspnea:  [employer], can't do mall = MMRC3 = can't walk 100 yards  even at a slow pace at a flat grade s stopping due to  Cough: minimal mucoid Sleeping: bed is flat, one pillow no am exac  SABA use: bid but not really helping / never pre challenges 02: none  Covid status: vax x 2, last 03/15/19  Rec 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 Summer 2021  Chief Complaint  Patient presents with   Follow-up  Dyspnea:  walking in mall x nl pace x 30 min / toting groceries into house < 25 ft gives out Cough: am smoker's rattle > white  Sleeping: flat pillow one pillow  SABA use: with activity never pre or re challenges  02: none  Covid status: x 3 vax      No obvious day to day or daytime variability or assoc  purulent sputum or mucus plugs or hemoptysis or cp or chest tightness, subjective wheeze or overt sinus or hb  symptoms.   Sleeping as above  without nocturnal  exacerbation  of respiratory  c/o's or need for noct saba. Also denies any obvious fluctuation of symptoms with weather or environmental changes or other aggravating or alleviating factors except as outlined above   No unusual exposure hx or h/o childhood pna/ asthma or knowledge of premature birth.  Current Allergies, Complete Past Medical History, Past Surgical History, Family History, and Social History were reviewed in [employer] record.  ROS  The following are not active complaints unless bolded Hoarseness, sore throat, dysphagia, dental problems, itching, sneezing,  nasal congestion or discharge of excess mucus or purulent secretions, ear ache,   fever, chills, sweats, unintended wt loss or wt gain, classically pleuritic or exertional cp,   orthopnea pnd or arm/hand swelling  or leg swelling, presyncope, palpitations, abdominal pain, anorexia, nausea, vomiting, diarrhea  or change in bowel habits or change in bladder habits, change in stools or change in urine, dysuria, hematuria,  rash, arthralgias, visual complaints, headache, numbness, weakness or ataxia or problems with walking or coordination,  change in mood or  memory.        Current Meds  Medication Sig   Ascorbic Acid (VITAMIN C) 1000 MG tablet Take 1,000 mg by mouth daily.   calcium gluconate 500 MG tablet Take 1 tablet by mouth 2 (two) times daily.   cholecalciferol (VITAMIN D) 25 MCG (1000 UNIT) tablet Take 1,000 Units by mouth daily. Takes 2 a day   ergocalciferol (VITAMIN D2) 1.25 MG (50000 UT) capsule Take 50,000 Units by mouth once a week.   ferrous sulfate (FEOSOL) 325 (65 FE) MG tablet Take 1 tablet (325 mg total) by mouth 3 (three) times daily with meals.   folic acid (FOLVITE) 1 MG tablet Take 1 mg by mouth daily.   gabapentin (NEURONTIN) 100 MG capsule TAKE (1) CAPSULE BY MOUTH THREE TIMES DAILY   hydrochlorothiazide (HYDRODIURIL) 25 MG tablet Take 25 mg by mouth daily.    methotrexate 2.5 MG tablet Take 2.5 mg by mouth once a week. Caution:Chemotherapy. Protect from light. Takes 5 tablets every Friday night before bedtime   pramipexole (MIRAPEX) 0.125 MG tablet Take 0.25 mg by mouth at bedtime.    pravastatin (PRAVACHOL) 20 MG tablet Take 20 mg by mouth daily.   predniSONE (DELTASONE) 10 MG tablet Take 1 tablet (10 mg total) by mouth daily.   VENTOLIN HFA 108 (90 Base) MCG/ACT inhaler                        Past Medical History:  Diagnosis Date   Anemia    Arthritis    History of kidney stones    Hypertension    Osteoporosis    PONV (postoperative nausea and vomiting)    after hernia surgery         Objective:     09/05/2020       166  03/05/20 160 lb 6.4 oz (72.8 kg)  12/25/19 153 lb (69.4 kg)  11/09/19 153 lb (69.4 kg)     Vital  signs reviewed  09/05/2020  - Note at rest 02 sats  97% on RA   General appearance:    amb wf smoker's rattle     HEENT : pt wearing mask not removed for exam due to covid - 19 concerns.    NECK :  without JVD/Nodes/TM/ nl carotid upstrokes bilaterally   LUNGS: no acc muscle use,  Mild barrel  contour  chest wall with bilateral  Distant bs s audible wheeze and  without cough on insp or exp maneuvers  and mild  Hyperresonant  to  percussion bilaterally     CV:  RRR  no s3 or murmur or increase in P2, and no edema   ABD:  soft and nontender with pos end  insp Hoover's  in the supine position. No bruits or organomegaly appreciated, bowel sounds nl  MS:   Nl gait/  ext warm with RA hand deformities, calf tenderness, cyanosis or clubbing No obvious joint restrictions   SKIN: warm and dry without lesions    NEURO:  alert, approp, nl sensorium with  no motor or cerebellar deficits apparent.        I personally reviewed images/ impression as follows:  CXR:   08/19/20 No acute changes               Assessment

## 2020-09-05 NOTE — Patient Instructions (Addendum)
Only use your albuterol as a rescue medication to be used if you can't catch your breath by resting or doing a relaxed purse lip breathing pattern.  - The less you use it, the better it will work when you need it. - Ok to use up to 2 puffs  every 4 hours if you must but call for immediate appointment if use goes up over your usual need - Don't leave home without it !!  (think of it like starter fluid for your car)   Ok to Try albuterol 15 min before an activity (on alternating days)  that you know would usually make you short of breath and see if it makes any difference and if makes none then don't take albuterol after activity unless you can't catch your breath as this means it's the resting that helps, not the albuterol.   Add:   referral to IR for [place] bx of thyroid   Please schedule a follow up visit in 6  months but call sooner if needed

## 2020-09-05 NOTE — Assessment & Plan Note (Signed)
Detected on CT chest 02/22/20 > rec u/s per guidelines done 03/14/2020 > rec bx > pt deferred - Bx rec 09/05/2020 >>>  Discussed in detail all the  indications, usual  risks and alternatives  relative to the benefits with patient who agrees to proceed with w/u as outlined.      Each maintenance medication was reviewed in detail including emphasizing most importantly the difference between maintenance and prns and under what circumstances the prns are to be triggered using an action plan format where appropriate.  Total time for H and P, chart review, counseling, reviewing hfa device(s) , directly observing portions of ambulatory 02 saturation study/ and generating customized AVS unique to this office visit / same day charting  > 30 min

## 2020-09-12 ENCOUNTER — Ambulatory Visit (HOSPITAL_COMMUNITY): Payer: Medicare Other

## 2020-09-18 ENCOUNTER — Ambulatory Visit (HOSPITAL_COMMUNITY): Admission: RE | Admit: 2020-09-18 | Payer: Medicare Other | Source: Ambulatory Visit

## 2020-10-09 ENCOUNTER — Other Ambulatory Visit: Payer: Self-pay

## 2020-10-09 ENCOUNTER — Ambulatory Visit (HOSPITAL_COMMUNITY)
Admission: RE | Admit: 2020-10-09 | Discharge: 2020-10-09 | Disposition: A | Discharge status: Home / Self Care | Payer: Medicare Other | Source: Ambulatory Visit | Attending: Internal Medicine | Admitting: Internal Medicine

## 2020-10-09 ENCOUNTER — Encounter (HOSPITAL_COMMUNITY): Payer: Self-pay

## 2020-10-09 DIAGNOSIS — D440 Neoplasm of uncertain behavior of thyroid gland: Secondary | ICD-10-CM | POA: Diagnosis not present

## 2020-10-09 DIAGNOSIS — E041 Nontoxic single thyroid nodule: Secondary | ICD-10-CM

## 2020-10-09 MED ORDER — LIDOCAINE HCL (PF) 2 % IJ SOLN: 10.0000 mL | Freq: Once | INTRAMUSCULAR | Status: AC

## 2020-10-09 MED ORDER — LIDOCAINE HCL (PF) 2 % IJ SOLN
INTRAMUSCULAR | Status: AC
  Administered 2020-10-09: 10 mL
  Filled 2020-10-09: qty 10

## 2020-10-09 NOTE — Progress Notes (Signed)
PT tolerated thyroid biopsy procedure well today. Labs obtained and sent for pathology. PT ambulatory at discharge with no acute distress noted, given ice pack and verbalized understanding of discharge instructions.

## 2020-10-10 LAB — CYTOLOGY - NON PAP

## 2020-10-11 ENCOUNTER — Telehealth: Payer: Self-pay | Admitting: Internal Medicine

## 2020-10-11 DIAGNOSIS — E041 Nontoxic single thyroid nodule: Secondary | ICD-10-CM

## 2020-10-11 NOTE — Progress Notes (Signed)
Tried calling the pt and there was no answer- LMTCB.  

## 2020-10-11 NOTE — Telephone Encounter (Signed)
Call patient :  Study is not diagnostic but can't be sure it's not early tumor > would like her to see a thyroid surgeon for a second opinion > refer  to Dr Harlow Asa   I have LM on VM for the pt to call back about her results per MW>

## 2020-10-11 NOTE — Telephone Encounter (Signed)
Call made to patient, confirmed DOB. Made aware of biopsy results. Referral placed to Dr Harlow Asa. Voiced understanding.   Nothing further needed at this time.

## 2020-10-14 ENCOUNTER — Telehealth: Payer: Self-pay | Admitting: Internal Medicine

## 2020-10-14 DIAGNOSIS — M0510 Rheumatoid lung disease with rheumatoid arthritis of unspecified site: Secondary | ICD-10-CM

## 2020-10-14 DIAGNOSIS — E041 Nontoxic single thyroid nodule: Secondary | ICD-10-CM

## 2020-10-14 NOTE — Telephone Encounter (Signed)
Referral ha been placed to be seen with endocrinology.  Nothing further is needed.

## 2020-10-14 NOTE — Telephone Encounter (Signed)
MW pt is requesting to be seen at [facility].  Can we change her referral in the computer or do you want her to see Dr. Harlow Asa?   thanks

## 2020-10-14 NOTE — Telephone Encounter (Signed)
Fine with me to do endocrine first

## 2020-10-15 ENCOUNTER — Telehealth: Payer: Self-pay | Admitting: Internal Medicine

## 2020-10-15 NOTE — Telephone Encounter (Signed)
The referral is in proficient and records are attached they should be able to see it now

## 2020-10-15 NOTE — Telephone Encounter (Signed)
PCC's please advise on this referral.  Pt is trying to set this appt up and the office has not received the referral yet.  Thanks

## 2020-10-15 NOTE — Telephone Encounter (Signed)
Called and spoke with pt and she is aware of referral that has been released.  Nothing further is needed.

## 2020-10-24 ENCOUNTER — Encounter (HOSPITAL_COMMUNITY): Payer: Self-pay | Admitting: Radiology

## 2020-11-07 ENCOUNTER — Ambulatory Visit: Payer: Medicare Other | Admitting: Orthopedic Surgery

## 2020-11-11 ENCOUNTER — Other Ambulatory Visit: Payer: Self-pay

## 2020-11-11 ENCOUNTER — Encounter: Payer: Self-pay | Admitting: "Endocrinology

## 2020-11-11 ENCOUNTER — Ambulatory Visit (INDEPENDENT_AMBULATORY_CARE_PROVIDER_SITE_OTHER): Payer: Medicare Other | Admitting: "Endocrinology

## 2020-11-11 VITALS — Ht 60.0 in | Wt 164.2 lb

## 2020-11-11 DIAGNOSIS — F17200 Nicotine dependence, unspecified, uncomplicated: Secondary | ICD-10-CM

## 2020-11-11 DIAGNOSIS — E049 Nontoxic goiter, unspecified: Secondary | ICD-10-CM | POA: Diagnosis not present

## 2020-11-11 NOTE — Progress Notes (Signed)
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   Procedure: TOTAL KNEE ARTHROPLASTY;  Surgeon: Carole Civil, MD;  Location: [facility];  Service: Orthopedics;  Laterality: Right;   TOTAL KNEE ARTHROPLASTY Left 12/31/2016   Procedure: TOTAL KNEE ARTHROPLASTY;  Surgeon: Carole Civil, MD;  Location: [facility];  Service: Orthopedics;  Laterality: Left;   TUBAL LIGATION     Social History   Socioeconomic History   Marital status: Married    Spouse name: Not on file   Number of children: Not on file   Years of education: Not on file   Highest education level: Not on file  Occupational History   Not on file  Tobacco Use   Smoking status: Every Day    Packs/day: 0.50    Years: 48.00    Pack years: 24.00    Types: Cigarettes   Smokeless tobacco: Never   Tobacco comments:    smokes 10 cigarettes per day 03/05/2020  Vaping Use   Vaping Use: Never used  Substance and Sexual Activity   Alcohol use: No   Drug use: No   Sexual activity: Yes    Birth control/protection: Surgical  Other Topics Concern   Not on file  Social History Narrative   Not on file   Social Determinants of Health   Financial Resource Strain: Not on file  Food Insecurity: Not on file  Transportation Needs: Not on file  Physical Activity: Not on file  Stress: Not on file  Social Connections: Not on file   Family History  Problem Relation Age of Onset   Hypertension Mother     Lung disease Mother    Cancer Father    Urticaria Neg Hx    Eczema Neg Hx    Immunodeficiency Neg Hx    Atopy Neg Hx    Asthma Neg Hx    Angioedema Neg Hx    Allergic rhinitis Neg Hx    Outpatient Encounter Medications as of 11/11/2020  Medication Sig   Multiple Vitamins-Minerals (CENTRUM SILVER 50+WOMEN PO) Take 1 tablet by mouth daily in the afternoon.   Ascorbic Acid (VITAMIN C) 1000 MG tablet Take 1,000 mg by mouth daily. (Patient not taking: Reported on 11/11/2020)   calcium gluconate 500 MG tablet Take 1 tablet by mouth 2 (two) times daily.   cholecalciferol (VITAMIN D) 25 MCG (1000 UNIT) tablet Take 1,000 Units by mouth daily. Takes 2 a day   ergocalciferol (VITAMIN D2) 1.25 MG (50000 UT) capsule Take 50,000 Units by mouth once a week. (Patient not taking: Reported on 11/11/2020)   ferrous sulfate (FEOSOL) 325 (65 FE) MG tablet Take 1  tablet (325 mg total) by mouth 3 (three) times daily with meals. (Patient not taking: Reported on 94/85/4627)   folic acid (FOLVITE) 1 MG tablet Take 1 mg by mouth daily.   gabapentin (NEURONTIN) 100 MG capsule TAKE (1) CAPSULE BY MOUTH THREE TIMES DAILY (Patient not taking: Reported on 11/11/2020)   hydrochlorothiazide (HYDRODIURIL) 25 MG tablet Take 25 mg by mouth daily.    methotrexate 2.5 MG tablet Take 2.5 mg by mouth once a week. Caution:Chemotherapy. Protect from light. Takes 5 tablets every Friday night before bedtime (Patient not taking: Reported on 11/11/2020)   pramipexole (MIRAPEX) 0.125 MG tablet Take 0.25 mg by mouth at bedtime.    pravastatin (PRAVACHOL) 20 MG tablet Take 20 mg by mouth daily.   predniSONE (DELTASONE) 10 MG tablet Take 1 tablet (10 mg total) by mouth daily.   VENTOLIN HFA 108 (90 Base) MCG/ACT inhaler    No facility-administered encounter medications on file as of 11/11/2020.   ALLERGIES: No Known Allergies  VACCINATION STATUS: Immunization History  Administered Date(s) Administered   Fluad Quad(high Dose  65+) 11/16/2019   Moderna Sars-Covid-2 Vaccination 02/17/2019, 03/15/2019   Pneumococcal Polysaccharide-23 11/16/2019    HPI Rhonda Briggs is 70 y.o. female who presents today with a medical history as above. she is being seen in consultation for nodular goiter requested by Zhou-Talbert, Elwyn Lade, MD. History is obtained directly from the patient as well as chart review.  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  3.2 cm suspicious nodule from the left lobe was biopsied on October 09, 2020.  Biopsy findings were consistent with atypia of undetermined significance.  A sample was sent for Afirma molecular testing which returned approximately 4% risk of malignancy.  This is considered benign finding.  She does not have thyroid function test to review.  She is a chronic heavy smoker.  She does not have family history of thyroid malignancy. She reports cough, wheezing, and dyspnea on exertion.  She denies dysphagia.   Review of Systems  Constitutional: + Minimally fluctuating body weight, +fatigue, no subjective hyperthermia, no subjective hypothermia Eyes: no blurry vision, no xerophthalmia ENT: no sore throat, no nodules palpated in throat, no dysphagia/odynophagia, no hoarseness Cardiovascular: no Chest Pain, no Shortness of Breath, no palpitations, no leg swelling Respiratory: no cough, no shortness of breath Gastrointestinal: no Nausea/Vomiting/Diarhhea Musculoskeletal: no muscle/joint aches Skin: no rashes Neurological: no tremors, no numbness, no tingling, no dizziness Psychiatric: no depression, no anxiety  Objective:    Vitals with BMI 11/11/2020 10/09/2020 10/09/2020  Height 5\' 0"  - -  Weight 164 lbs 3 oz - -  BMI 03.50 - -  Systolic - 093 818  Diastolic - 70 77  Pulse - 82 87    Ht 5' (1.524 m)   Wt 164 lb 3.2 oz (74.5 kg)   BMI 32.07 kg/m   Wt Readings from Last 3 Encounters:  11/11/20 164 lb 3.2 oz (74.5 kg)  09/05/20 166 lb (75.3 kg)   03/05/20 160 lb 6.4 oz (72.8 kg)    Physical Exam  Constitutional:  Body mass index is 32.07 kg/m.,  not in acute distress, normal state of mind Eyes: PERRLA, EOMI, no exophthalmos ENT: moist mucous membranes, no gross thyromegaly, no gross cervical lymphadenopathy Cardiovascular: normal precordial activity, Regular Rate and Rhythm, no Murmur/Rubs/Gallops Respiratory:  adequate breathing efforts, no gross chest deformity,  + wheezes on bilateral lung fields.   Gastrointestinal: abdomen soft, Non -tender, No distension, Bowel Sounds  present, no gross organomegaly Musculoskeletal: no gross deformities, strength intact in all four extremities Skin: moist, warm, no rashes Neurological: no tremor with outstretched hands, Deep tendon reflexes normal in bilateral lower extremities.  CMP ( most recent) CMP     Component Value Date/Time   NA 133 (L) 01/01/2017 0427   K 3.8 01/01/2017 0427   CL 102 01/01/2017 0427   CO2 24 01/01/2017 0427   GLUCOSE 99 01/01/2017 0427   BUN 19 01/01/2017 0427   CREATININE 1.09 (H) 01/01/2017 0427   CALCIUM 7.9 (L) 01/01/2017 0427   GFRNONAA 52 (L) 01/01/2017 0427   GFRAA 60 (L) 01/01/2017 0427     Thyroid ultrasound from March 14, 2020: Right lobe 3.6 cm with no discrete nodule.  Left lobe 4.7 cm with 3.2 cm suspicious nodule.  Fine-needle aspiration on October 09, 2020-atypia of undetermined significance. Afirma molecular study  ~4% risk of malignancy    Assessment & Plan:   1. Nodular goiter  - Sintia Mckissic  is being seen at a kind request of Zhou-Talbert, Elwyn Lade, MD. - I have reviewed her available thyroid records and clinically evaluated the patient.  This discussion included on her imaging and biopsy results. - Based on these reviews, she has benign nodular goiter, will not need surgical intervention at this time -She will need thyroid function tests including TSH, free T4/free T3, and antithyroid antibodies. -she will return in 1  week to review her repeat labs.  - I did not initiate any new prescriptions today.  The single most important medical decision for her to do will be smoking cessation.  She is in the precontemplative phase.  Will need continuous, subsequent follow-up. The patient was counseled on the dangers of tobacco use, and was advised to quit.  Reviewed strategies to maximize success, including removing cigarettes and smoking materials from environment.    - she is advised to maintain close follow up with Zhou-Talbert, Elwyn Lade, MD for primary care needs.   - Time spent with the patient: 50 minutes, of which >50% was spent in  counseling her about her nodular goiter and the rest in obtaining information about her symptoms, reviewing her previous labs/studies ( including abstractions from other facilities),  evaluations, and treatments,  and developing a plan to confirm diagnosis and long term treatment based on the latest standards of care/guidelines; and documenting her care.  Rhonda Briggs participated in the discussions, expressed understanding, and voiced agreement with the above plans.  All questions were answered to her satisfaction. she is encouraged to contact clinic should she have any questions or concerns prior to her return visit.  Follow up plan: Return in about 1 week (around 11/18/2020) for Labs Today- Non-Fasting Ok.   Glade Lloyd, MD [facility] [address] [place], [place] [address] Phone: [phone]  Fax: [phone]     11/11/2020, 4:03 PM  This note was partially dictated with voice recognition software. Similar sounding words can be transcribed inadequately or may not  be corrected upon review.

## 2020-11-12 LAB — T3, FREE: T3, Free: 3 pg/mL (ref 2.0–4.4)

## 2020-11-12 LAB — T4, FREE: Free T4: 1.78 ng/dL — ABNORMAL HIGH (ref 0.82–1.77)

## 2020-11-12 LAB — THYROGLOBULIN ANTIBODY: Thyroglobulin Antibody: <1 IU/mL (ref 0.0–0.9)

## 2020-11-12 LAB — TSH: TSH: 0.825 u[IU]/mL (ref 0.450–4.500)

## 2020-11-12 LAB — THYROID PEROXIDASE ANTIBODY: Thyroperoxidase Ab SerPl-aCnc: 10 IU/mL (ref 0–34)

## 2020-11-18 ENCOUNTER — Ambulatory Visit (INDEPENDENT_AMBULATORY_CARE_PROVIDER_SITE_OTHER): Payer: Medicare Other | Admitting: "Endocrinology

## 2020-11-18 ENCOUNTER — Encounter: Payer: Self-pay | Admitting: "Endocrinology

## 2020-11-18 VITALS — BP 122/80 | HR 92 | Ht 60.0 in | Wt 165.2 lb

## 2020-11-18 DIAGNOSIS — R7303 Prediabetes: Secondary | ICD-10-CM | POA: Insufficient documentation

## 2020-11-18 DIAGNOSIS — E049 Nontoxic goiter, unspecified: Secondary | ICD-10-CM

## 2020-11-18 DIAGNOSIS — F17200 Nicotine dependence, unspecified, uncomplicated: Secondary | ICD-10-CM

## 2020-11-18 NOTE — Patient Instructions (Signed)

## 2020-11-18 NOTE — Progress Notes (Signed)
11/18/2020, 5:41 PM   Endocrinology follow-up note  Subjective:    Patient ID: Rhonda Briggs, female    DOB: 1950/06/29, PCP Zhou-Talbert, Elwyn Lade, MD   Past Medical History:  Diagnosis Date   Anemia    Arthritis    History of kidney stones    Hyperlipidemia    Hypertension    Osteoporosis    PONV (postoperative nausea and vomiting)    after hernia surgery   Past Surgical History:  Procedure Laterality Date   CARPAL TUNNEL RELEASE Right 2011   HERNIA REPAIR Left    inguinal   TOTAL KNEE ARTHROPLASTY Right 08/19/2016   Procedure: TOTAL KNEE ARTHROPLASTY;  Surgeon: Carole Civil, MD;  Location: [facility];  Service: Orthopedics;  Laterality: Right;   TOTAL KNEE ARTHROPLASTY Left 12/31/2016   Procedure: TOTAL KNEE ARTHROPLASTY;  Surgeon: Carole Civil, MD;  Location: [facility];  Service: Orthopedics;  Laterality: Left;   TUBAL LIGATION     Social History   Socioeconomic History   Marital status: Married    Spouse name: Not on file   Number of children: Not on file   Years of education: Not on file   Highest education level: Not on file  Occupational History   Not on file  Tobacco Use   Smoking status: Every Day    Packs/day: 0.50    Years: 48.00    Pack years: 24.00    Types: Cigarettes   Smokeless tobacco: Never   Tobacco comments:    smokes 10 cigarettes per day 03/05/2020  Vaping Use   Vaping Use: Never used  Substance and Sexual Activity   Alcohol use: No   Drug use: No   Sexual activity: Yes    Birth control/protection: Surgical  Other Topics Concern   Not on file  Social History Narrative   Not on file   Social Determinants of Health   Financial Resource Strain: Not on file  Food Insecurity: Not on file  Transportation Needs: Not on file  Physical Activity: Not on file  Stress: Not on file  Social Connections: Not on file   Family History  Problem Relation Age of Onset   Hypertension  Mother    Lung disease Mother    Cancer Father    Urticaria Neg Hx    Eczema Neg Hx    Immunodeficiency Neg Hx    Atopy Neg Hx    Asthma Neg Hx    Angioedema Neg Hx    Allergic rhinitis Neg Hx    Outpatient Encounter Medications as of 11/18/2020  Medication Sig   calcium gluconate 500 MG tablet Take 1 tablet by mouth 2 (two) times daily.   cholecalciferol (VITAMIN D) 25 MCG (1000 UNIT) tablet Take 1,000 Units by mouth daily. Takes 2 a day   folic acid (FOLVITE) 1 MG tablet Take 1 mg by mouth daily.   hydrochlorothiazide (HYDRODIURIL) 25 MG tablet Take 25 mg by mouth daily.    Multiple Vitamins-Minerals (CENTRUM SILVER 50+WOMEN PO) Take 1 tablet by mouth daily in the afternoon.   pramipexole (MIRAPEX) 0.125 MG tablet Take 0.25 mg by mouth at bedtime.    pravastatin (PRAVACHOL) 20 MG tablet Take 20 mg by mouth daily.  predniSONE (DELTASONE) 10 MG tablet Take 1 tablet (10 mg total) by mouth daily.   VENTOLIN HFA 108 (90 Base) MCG/ACT inhaler    [DISCONTINUED] Ascorbic Acid (VITAMIN C) 1000 MG tablet Take 1,000 mg by mouth daily. (Patient not taking: Reported on 11/11/2020)   [DISCONTINUED] ergocalciferol (VITAMIN D2) 1.25 MG (50000 UT) capsule Take 50,000 Units by mouth once a week. (Patient not taking: Reported on 11/11/2020)   [DISCONTINUED] ferrous sulfate (FEOSOL) 325 (65 FE) MG tablet Take 1 tablet (325 mg total) by mouth 3 (three) times daily with meals. (Patient not taking: Reported on 11/11/2020)   [DISCONTINUED] gabapentin (NEURONTIN) 100 MG capsule TAKE (1) CAPSULE BY MOUTH THREE TIMES DAILY (Patient not taking: Reported on 11/11/2020)   [DISCONTINUED] methotrexate 2.5 MG tablet Take 2.5 mg by mouth once a week. Caution:Chemotherapy. Protect from light. Takes 5 tablets every Friday night before bedtime (Patient not taking: Reported on 11/11/2020)   No facility-administered encounter medications on file as of 11/18/2020.   ALLERGIES: No Known Allergies  VACCINATION  STATUS: Immunization History  Administered Date(s) Administered   Fluad Quad(high Dose 65+) 11/16/2019   Moderna Sars-Covid-2 Vaccination 02/17/2019, 03/15/2019   Pneumococcal Polysaccharide-23 11/16/2019    HPI Rhonda Briggs is 70 y.o. female who presents today with a medical history as above. she is being seen in  follow up with [facility] after she was seen in consultation for nodular goiter requested by Zhou-Talbert, Elwyn Lade, MD. See notes from the previous visit.  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  3.2 cm suspicious nodule from the left lobe was biopsied on October 09, 2020.  Biopsy findings were consistent with atypia of undetermined significance.  A sample was sent for Afirma molecular testing which returned approximately 4% risk of malignancy.  This is considered benign finding.  She does not have thyroid function test to review.  She is a chronic heavy smoker.  She does not have family history of thyroid malignancy. She reports cough, wheezing, and dyspnea on exertion.  She denies dysphagia.  Her previsit thyroid function tests are consistent with euthyroid state.  She remains a heavy smoker. She also gives history of prediabetes in the past.  She is not on any intervention.   Review of Systems  Constitutional: + Minimally fluctuating body weight, +fatigue, no subjective hyperthermia, no subjective hypothermia Eyes: no blurry vision, no xerophthalmia ENT: no sore throat, no nodules palpated in throat, no dysphagia/odynophagia, no hoarseness Cardiovascular: no Chest Pain, no Shortness of Breath, no palpitations, no leg swelling Respiratory: no cough, no shortness of breath Gastrointestinal: no Nausea/Vomiting/Diarhhea Musculoskeletal: no muscle/joint aches Skin: no rashes Neurological: no tremors, no numbness, no tingling, no dizziness Psychiatric: no depression, no anxiety  Objective:    Vitals with BMI 11/18/2020 11/11/2020 10/09/2020   Height 5\' 0"  5\' 0"  -  Weight 165 lbs 3 oz 164 lbs 3 oz -  BMI 07.37 10.62 -  Systolic 694 - 854  Diastolic 80 - 70  Pulse 92 - 82    BP 122/80   Pulse 92   Ht 5' (1.524 m)   Wt 165 lb 3.2 oz (74.9 kg)   BMI 32.26 kg/m   Wt Readings from Last 3 Encounters:  11/18/20 165 lb 3.2 oz (74.9 kg)  11/11/20 164 lb 3.2 oz (74.5 kg)  09/05/20 166 lb (75.3 kg)    Physical Exam  Constitutional:  Body mass index is 32.26 kg/m.,  not in acute distress, normal state of mind  Eyes: PERRLA, EOMI, no exophthalmos ENT: moist mucous membranes, no gross thyromegaly, no gross cervical lymphadenopathy Cardiovascular: normal precordial activity, Regular Rate and Rhythm, no Murmur/Rubs/Gallops Respiratory:  adequate breathing efforts, no gross chest deformity,  + wheezes on bilateral lung fields.   Gastrointestinal: abdomen soft, Non -tender, No distension, Bowel Sounds present, no gross organomegaly Musculoskeletal: no gross deformities, strength intact in all four extremities Skin: moist, warm, no rashes Neurological: no tremor with outstretched hands, Deep tendon reflexes normal in bilateral lower extremities.  CMP ( most recent) CMP     Component Value Date/Time   NA 133 (L) 01/01/2017 0427   K 3.8 01/01/2017 0427   CL 102 01/01/2017 0427   CO2 24 01/01/2017 0427   GLUCOSE 99 01/01/2017 0427   BUN 19 01/01/2017 0427   CREATININE 1.09 (H) 01/01/2017 0427   CALCIUM 7.9 (L) 01/01/2017 0427   GFRNONAA 52 (L) 01/01/2017 0427   GFRAA 60 (L) 01/01/2017 0427     Thyroid ultrasound from March 14, 2020: Right lobe 3.6 cm with no discrete nodule.  Left lobe 4.7 cm with 3.2 cm suspicious nodule.  Fine-needle aspiration on October 09, 2020-atypia of undetermined significance. Afirma molecular study  ~4% risk of malignancy  Recent Results (from the past 2160 hour(s))  Cytology - Non PAP;     Status: None   Collection Time: 10/09/20 11:36 AM  Result Value Ref Range   CYTOLOGY - NON GYN       CYTOLOGY - NON PAP CASE: [id number] PATIENT: Agnes Alspaugh Non-Gynecological Cytology Report     Clinical History: 3.2 cm LLL Specimen Submitted:  A. 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  GROSS: Received is/are 3 slides in 95% Ethyl alcohol, 3 air dried slides for diff stain, and 30 ccs of pale pink Cytolyt solution. (CM:cm) Prepared: Smears:  6 Concentration Method (Thin Prep):  1 Cell Block:  Cell block attempted, not obtained. Additional Studies:  Also there was an Afirma collected.     Final Diagnosis performed by Vicente Males, MD.   Electronically signed 10/10/2020 Technical component performed at [facility], [place] [address], [place], [place] [address].  Professional component performed at [facility], [place] [address], [place], [place] [address].  Immunohistochemistry Technical component (if applicable) was performed at [facility]. [address] [place], [place], [place] [address].   IMMUNOHISTOCHEMISTRY DISCLAIMER (if applicable): Some of these immunohistochemical stains may have been developed and the performance characteristics determine by [facility]. Some may not have been cleared or approved by the U.S. Food and Drug Administration. The FDA has determined that such clearance or approval is not necessary. This test is used for clinical purposes. It should not be regarded as investigational or for research. This laboratory is certified under the [place] (CLIA-88) as qualified to perform high complexity clinical laboratory testing.  The controls stained appropriately.   TSH     Status: None   Collection Time: 11/11/20  2:15 PM   Result Value Ref Range   TSH 0.825 0.450 - 4.500 uIU/mL  T4, free     Status: Abnormal   Collection Time: 11/11/20  2:15 PM  Result Value Ref Range   Free T4 1.78 (H) 0.82 - 1.77 ng/dL  T3, free  Status: None   Collection Time: 11/11/20  2:15 PM  Result Value Ref Range   T3, Free 3.0 2.0 - 4.4 pg/mL  Thyroid peroxidase antibody     Status: None   Collection Time: 11/11/20  2:15 PM  Result Value Ref Range   Thyroperoxidase Ab SerPl-aCnc 10 0 - 34 IU/mL  Thyroglobulin antibody     Status: None   Collection Time: 11/11/20  2:15 PM  Result Value Ref Range   Thyroglobulin Antibody <1.0 0.0 - 0.9 IU/mL    Comment: Thyroglobulin Antibody measured by [employer] Methodology     Assessment & Plan:   1. Nodular goiter  Her previsit thyroid function tests are consistent with euthyroid state. - I have reviewed her available thyroid records and clinically evaluated the patient.  This discussion included on her imaging and biopsy results. - Based on these reviews, she has benign nodular goiter, will not need surgical intervention at this time No antithyroid antibodies. -she will return in 6 months with repeat thyroid function test for evaluation.   Regarding her reported history of prediabetes: - she acknowledges that there is a room for improvement in her food and drink choices. - Suggestion is made for her to avoid simple carbohydrates  from her diet including Cakes, Sweet Desserts, Ice Cream, Soda (diet and regular), Sweet Tea, Candies, Chips, Cookies, Store Bought Juices, Alcohol in Excess of  1-2 drinks a day, Artificial Sweeteners,  Coffee Creamer, and "Sugar-free" Products, Lemonade. This will help patient to have more stable blood glucose profile and potentially avoid unintended weight gain. 1 week to review her repeat labs. She will have point-of-care A1c during her next visit.  - I did not initiate any new prescriptions today.  The single most important medical decision  for her to do will be smoking cessation.  She is in the precontemplative phase.  Will need continuous, subsequent follow-up.  The patient was counseled on the dangers of tobacco use, and was advised to quit.  Reviewed strategies to maximize success, including removing cigarettes and smoking materials from environment.    - she is advised to maintain close follow up with Zhou-Talbert, Elwyn Lade, MD for primary care needs.   I spent 31 minutes in the care of the patient today including review of labs from Thyroid Function, CMP, and other relevant labs ; imaging/biopsy records (current and previous including abstractions from other facilities); face-to-face time discussing  her lab results and symptoms, medications doses, her options of short and long term treatment based on the latest standards of care / guidelines;   and documenting the encounter.  Rhonda Briggs  participated in the discussions, expressed understanding, and voiced agreement with the above plans.  All questions were answered to her satisfaction. she is encouraged to contact clinic should she have any questions or concerns prior to her return visit.   Follow up plan: Return in about 6 months (around 05/19/2021) for F/U with Pre-visit Labs, A1c -NV.   Glade Lloyd, MD [facility] [address] [place], [place] [address] Phone: [phone]  Fax: [phone]     11/18/2020, 5:41 PM  This note was partially dictated with voice recognition software. Similar sounding words can be transcribed inadequately or may not  be corrected upon review.

## 2021-02-07 ENCOUNTER — Encounter (HOSPITAL_COMMUNITY): Payer: Self-pay | Admitting: Emergency Medicine

## 2021-02-07 ENCOUNTER — Emergency Department (HOSPITAL_COMMUNITY)
Admission: EM | Admit: 2021-02-07 | Discharge: 2021-02-07 | Disposition: A | Discharge status: Home / Self Care | Payer: Medicare HMO | Attending: Emergency Medicine | Admitting: Emergency Medicine

## 2021-02-07 ENCOUNTER — Other Ambulatory Visit: Payer: Self-pay

## 2021-02-07 DIAGNOSIS — B0230 Zoster ocular disease, unspecified: Secondary | ICD-10-CM | POA: Diagnosis not present

## 2021-02-07 DIAGNOSIS — H1089 Other conjunctivitis: Secondary | ICD-10-CM | POA: Diagnosis not present

## 2021-02-07 DIAGNOSIS — H109 Unspecified conjunctivitis: Secondary | ICD-10-CM

## 2021-02-07 DIAGNOSIS — R519 Headache, unspecified: Secondary | ICD-10-CM | POA: Diagnosis present

## 2021-02-07 MED ORDER — OFLOXACIN 0.3 % OP SOLN
1.0000 [drp] | Freq: Four times a day (QID) | OPHTHALMIC | Status: DC
  Administered 2021-02-07: 1 [drp] via OPHTHALMIC
  Filled 2021-02-07: qty 5

## 2021-02-07 NOTE — ED Triage Notes (Signed)
Pt with shingles to left side of forehead and bilateral eyes, with drainage and swelling to both eyes. Arrives via ems from doctor's office at [facility] per pcp orders. Symptoms started last Saturday and pt diagnosed on the 9th. Tx started with prednisone, amoxicillin, and valtrex but symptoms became progressively worse. States pcp concerned about swelling.

## 2021-02-07 NOTE — Discharge Instructions (Signed)
1) STOP taking your Pred Forte eye drop. 2) Take your Ofloxacin eye drop 4 times a day. 3) if you are no better by Sunday please call Dr. Carolynn Sayers to be seen by him as soon as possible  Get help right away if: You have a fever and your symptoms suddenly get worse. You have severe pain when you move your eye. You have facial pain, redness, or swelling. You have a sudden loss of vision.

## 2021-02-07 NOTE — ED Notes (Signed)
Correction to triage note. Shingle noted to right side of forehead, not left. Mostly crusted over, but drainage noted to eyes.

## 2021-02-07 NOTE — ED Notes (Signed)
Visual Acuity - right eye swollen shut, pt unable to open enough to see. Left eye pt was able to read to 20/20 level on snellen chart.

## 2021-02-07 NOTE — ED Provider Notes (Signed)
[facility] EMERGENCY DEPARTMENT Provider Note   CSN: [id number] Arrival date & time: 02/07/21  1244     History  Chief Complaint  Patient presents with   Herpes Zoster    Rhonda Briggs is a 71 y.o. female who presents emergency department for evaluation of right facial pain and eye discharge.  She has known diagnosis of shingles of the left side of her forehead and eye.  She has been seen by ophthalmology is on Pred forte drops for the right eye along with pain medication and Valtrex for treatment of her zoster infection.  She saw her PCP who was concerned because of worsening redness and swelling.  Patient states that she came in because "it done got worse."  She complains of eye pain and discharge.  She she declines pain medication at this time  HPI     Home Medications Prior to Admission medications   Medication Sig Start Date End Date Taking? Authorizing Provider  calcium gluconate 500 MG tablet Take 1 tablet by mouth 2 (two) times daily.    [provider]  cholecalciferol (VITAMIN D) 25 MCG (1000 UNIT) tablet Take 1,000 Units by mouth daily. Takes 2 a day    [provider]  folic acid (FOLVITE) 1 MG tablet Take 1 mg by mouth daily.    [provider]  hydrochlorothiazide (HYDRODIURIL) 25 MG tablet Take 25 mg by mouth daily.     [provider]  Multiple Vitamins-Minerals (CENTRUM SILVER 50+WOMEN PO) Take 1 tablet by mouth daily in the afternoon.    [provider]  pramipexole (MIRAPEX) 0.125 MG tablet Take 0.25 mg by mouth at bedtime.     [provider]  pravastatin (PRAVACHOL) 20 MG tablet Take 20 mg by mouth daily.    [provider]  predniSONE (DELTASONE) 10 MG tablet Take 1 tablet (10 mg total) by mouth daily. 11/09/19   Carole Civil, MD  VENTOLIN HFA 108 [phone] Base) MCG/ACT inhaler  02/10/18   [provider]      Allergies    Patient has no known allergies.    Review of Systems   Review  of Systems Per hpi Physical Exam Updated Vital Signs BP (!) 134/47 (BP Location: Right Arm)    Pulse 73    Temp 98.3 F (36.8 C) (Oral)    Resp 18    Ht 5' (1.524 m)    Wt 73.9 kg    SpO2 98%    BMI 31.83 kg/m  Physical Exam Vitals and nursing note reviewed.  Constitutional:      General: She is not in acute distress.    Appearance: She is well-developed. She is not diaphoretic.  HENT:     Head: Normocephalic and atraumatic.     Comments: Erythematous rash over the right side of the scalp and face with vesicular, bullous formation in various states of the right shin, right thigh is significantly swollen with significant chemosis and discharge from the right eye    Right Ear: External ear normal.     Left Ear: External ear normal.     Nose: Nose normal.     Mouth/Throat:     Mouth: Mucous membranes are moist.  Eyes:     General: No scleral icterus.    Conjunctiva/sclera: Conjunctivae normal.  Cardiovascular:     Rate and Rhythm: Normal rate and regular rhythm.     Heart sounds: Normal heart sounds. No murmur heard.   No friction rub. No  gallop.  Pulmonary:     Effort: Pulmonary effort is normal. No respiratory distress.     Breath sounds: Normal breath sounds.  Abdominal:     General: Bowel sounds are normal. There is no distension.     Palpations: Abdomen is soft. There is no mass.     Tenderness: There is no abdominal tenderness. There is no guarding.  Musculoskeletal:     Cervical back: Normal range of motion.  Skin:    General: Skin is warm and dry.  Neurological:     Mental Status: She is alert and oriented to person, place, and time.  Psychiatric:        Behavior: Behavior normal.    ED Results / Procedures / Treatments   Labs (all labs ordered are listed, but only abnormal results are displayed) Labs Reviewed - No data to display  EKG None  Radiology No results found.  Procedures Procedures    Medications Ordered in ED Medications  ofloxacin  (OCUFLOX) 0.3 % ophthalmic solution 1 drop (1 drop Right Eye Given 02/07/21 1746)    ED Course/ Medical Decision Making/ A&P                           Medical Decision Making  71 year old female here with known herpes zoster and herpes zoster ophthalmicus of the right eye. Differential diagnosis includes secondary bacterial conjunctivitis, septal cellulitis.  Patient is able to move the eye without pain.  This appears to be secondary bacterial conjunctivitis.  Case discussed with Dr. Midge Aver.  Patient will need coverage with ofloxacin which is what he recommends 1 drop 4 times daily.  She is advised that if she does not have improvement in the next 24 to 36 hours she should call Dr. [name] Fitch he will see her in his office.  Patient is comfortable with plan.  She understands reasons to seek immediate medical care.  She was appropriate for discharge this time Final Clinical Impression(s) / ED Diagnoses Final diagnoses:  Herpes zoster ophthalmicus of right eye  Bacterial conjunctivitis of right eye    Rx / DC Orders ED Discharge Orders     None         Margarita Mail, PA-C 02/07/21 1851    Milton Ferguson, MD 02/08/21 [phone]

## 2021-03-11 ENCOUNTER — Ambulatory Visit: Payer: Medicare Other | Admitting: Internal Medicine

## 2021-03-17 ENCOUNTER — Emergency Department (HOSPITAL_COMMUNITY): Payer: Medicare HMO

## 2021-03-17 ENCOUNTER — Inpatient Hospital Stay (HOSPITAL_COMMUNITY)
Admission: EM | Admit: 2021-03-17 | Discharge: 2021-03-20 | DRG: 378 | Disposition: A | Discharge status: Home / Self Care | Payer: Medicare HMO | Attending: Family Medicine | Admitting: Family Medicine

## 2021-03-17 ENCOUNTER — Other Ambulatory Visit: Payer: Self-pay

## 2021-03-17 ENCOUNTER — Encounter (HOSPITAL_COMMUNITY): Payer: Self-pay

## 2021-03-17 DIAGNOSIS — Z800 Family history of malignant neoplasm of digestive organs: Secondary | ICD-10-CM

## 2021-03-17 DIAGNOSIS — D649 Anemia, unspecified: Secondary | ICD-10-CM

## 2021-03-17 DIAGNOSIS — Z20822 Contact with and (suspected) exposure to covid-19: Secondary | ICD-10-CM | POA: Diagnosis present

## 2021-03-17 DIAGNOSIS — K5731 Diverticulosis of large intestine without perforation or abscess with bleeding: Secondary | ICD-10-CM | POA: Diagnosis not present

## 2021-03-17 DIAGNOSIS — I129 Hypertensive chronic kidney disease with stage 1 through stage 4 chronic kidney disease, or unspecified chronic kidney disease: Secondary | ICD-10-CM | POA: Diagnosis present

## 2021-03-17 DIAGNOSIS — K559 Vascular disorder of intestine, unspecified: Secondary | ICD-10-CM | POA: Diagnosis present

## 2021-03-17 DIAGNOSIS — Z6833 Body mass index (BMI) 33.0-33.9, adult: Secondary | ICD-10-CM

## 2021-03-17 DIAGNOSIS — F17210 Nicotine dependence, cigarettes, uncomplicated: Secondary | ICD-10-CM | POA: Diagnosis present

## 2021-03-17 DIAGNOSIS — K222 Esophageal obstruction: Secondary | ICD-10-CM | POA: Diagnosis present

## 2021-03-17 DIAGNOSIS — J449 Chronic obstructive pulmonary disease, unspecified: Secondary | ICD-10-CM | POA: Diagnosis present

## 2021-03-17 DIAGNOSIS — Z79899 Other long term (current) drug therapy: Secondary | ICD-10-CM

## 2021-03-17 DIAGNOSIS — E669 Obesity, unspecified: Secondary | ICD-10-CM | POA: Diagnosis present

## 2021-03-17 DIAGNOSIS — D631 Anemia in chronic kidney disease: Secondary | ICD-10-CM | POA: Diagnosis present

## 2021-03-17 DIAGNOSIS — K921 Melena: Secondary | ICD-10-CM | POA: Diagnosis not present

## 2021-03-17 DIAGNOSIS — K2970 Gastritis, unspecified, without bleeding: Secondary | ICD-10-CM | POA: Diagnosis present

## 2021-03-17 DIAGNOSIS — M810 Age-related osteoporosis without current pathological fracture: Secondary | ICD-10-CM | POA: Diagnosis present

## 2021-03-17 DIAGNOSIS — G2581 Restless legs syndrome: Secondary | ICD-10-CM | POA: Diagnosis present

## 2021-03-17 DIAGNOSIS — Z96653 Presence of artificial knee joint, bilateral: Secondary | ICD-10-CM | POA: Diagnosis present

## 2021-03-17 DIAGNOSIS — N1832 Chronic kidney disease, stage 3b: Secondary | ICD-10-CM | POA: Diagnosis not present

## 2021-03-17 DIAGNOSIS — E785 Hyperlipidemia, unspecified: Secondary | ICD-10-CM | POA: Diagnosis present

## 2021-03-17 DIAGNOSIS — Z791 Long term (current) use of non-steroidal anti-inflammatories (NSAID): Secondary | ICD-10-CM

## 2021-03-17 DIAGNOSIS — D62 Acute posthemorrhagic anemia: Secondary | ICD-10-CM | POA: Diagnosis present

## 2021-03-17 DIAGNOSIS — Z8249 Family history of ischemic heart disease and other diseases of the circulatory system: Secondary | ICD-10-CM

## 2021-03-17 DIAGNOSIS — J441 Chronic obstructive pulmonary disease with (acute) exacerbation: Secondary | ICD-10-CM | POA: Diagnosis present

## 2021-03-17 DIAGNOSIS — I9589 Other hypotension: Secondary | ICD-10-CM | POA: Diagnosis present

## 2021-03-17 DIAGNOSIS — K2980 Duodenitis without bleeding: Secondary | ICD-10-CM | POA: Diagnosis present

## 2021-03-17 DIAGNOSIS — K648 Other hemorrhoids: Secondary | ICD-10-CM | POA: Diagnosis present

## 2021-03-17 DIAGNOSIS — I10 Essential (primary) hypertension: Secondary | ICD-10-CM

## 2021-03-17 DIAGNOSIS — Z8739 Personal history of other diseases of the musculoskeletal system and connective tissue: Secondary | ICD-10-CM

## 2021-03-17 DIAGNOSIS — K449 Diaphragmatic hernia without obstruction or gangrene: Secondary | ICD-10-CM | POA: Diagnosis present

## 2021-03-17 DIAGNOSIS — K922 Gastrointestinal hemorrhage, unspecified: Secondary | ICD-10-CM | POA: Diagnosis not present

## 2021-03-17 DIAGNOSIS — K635 Polyp of colon: Secondary | ICD-10-CM | POA: Diagnosis present

## 2021-03-17 DIAGNOSIS — Z880 Allergy status to penicillin: Secondary | ICD-10-CM

## 2021-03-17 DIAGNOSIS — N183 Chronic kidney disease, stage 3 unspecified: Secondary | ICD-10-CM | POA: Diagnosis present

## 2021-03-17 LAB — CBC WITH DIFFERENTIAL/PLATELET
Abs Immature Granulocytes: 0.13 10*3/uL — ABNORMAL HIGH (ref 0.00–0.07)
Basophils Absolute: 0.1 10*3/uL (ref 0.0–0.1)
Basophils Relative: 0 %
Eosinophils Absolute: 0.2 10*3/uL (ref 0.0–0.5)
Eosinophils Relative: 2 %
HCT: 36.5 % (ref 36.0–46.0)
Hemoglobin: 11.5 g/dL — ABNORMAL LOW (ref 12.0–15.0)
Immature Granulocytes: 1 %
Lymphocytes Relative: 19 %
Lymphs Abs: 2.7 10*3/uL (ref 0.7–4.0)
MCH: 28.8 pg (ref 26.0–34.0)
MCHC: 31.5 g/dL (ref 30.0–36.0)
MCV: 91.5 fL (ref 80.0–100.0)
Monocytes Absolute: 0.9 10*3/uL (ref 0.1–1.0)
Monocytes Relative: 6 %
Neutro Abs: 10.4 10*3/uL — ABNORMAL HIGH (ref 1.7–7.7)
Neutrophils Relative %: 72 %
Platelets: 449 10*3/uL — ABNORMAL HIGH (ref 150–400)
RBC: 3.99 MIL/uL (ref 3.87–5.11)
RDW: 18.8 % — ABNORMAL HIGH (ref 11.5–15.5)
WBC: 14.4 10*3/uL — ABNORMAL HIGH (ref 4.0–10.5)
nRBC: 0 % (ref 0.0–0.2)

## 2021-03-17 LAB — COMPREHENSIVE METABOLIC PANEL
ALT: 14 U/L (ref 0–44)
AST: 14 U/L — ABNORMAL LOW (ref 15–41)
Albumin: 3.4 g/dL — ABNORMAL LOW (ref 3.5–5.0)
Alkaline Phosphatase: 47 U/L (ref 38–126)
Anion gap: 11 (ref 5–15)
BUN: 32 mg/dL — ABNORMAL HIGH (ref 8–23)
CO2: 27 mmol/L (ref 22–32)
Calcium: 8.7 mg/dL — ABNORMAL LOW (ref 8.9–10.3)
Chloride: 99 mmol/L (ref 98–111)
Creatinine, Ser: 1.65 mg/dL — ABNORMAL HIGH (ref 0.44–1.00)
GFR, Estimated: 33 mL/min — ABNORMAL LOW (ref >60)
Glucose, Bld: 150 mg/dL — ABNORMAL HIGH (ref 70–99)
Potassium: 4.1 mmol/L (ref 3.5–5.1)
Sodium: 137 mmol/L (ref 135–145)
Total Bilirubin: 0.6 mg/dL (ref 0.3–1.2)
Total Protein: 5.8 g/dL — ABNORMAL LOW (ref 6.5–8.1)

## 2021-03-17 LAB — PREPARE RBC (CROSSMATCH)

## 2021-03-17 LAB — HEMOGLOBIN AND HEMATOCRIT, BLOOD
HCT: 30 % — ABNORMAL LOW (ref 36.0–46.0)
Hemoglobin: 9.4 g/dL — ABNORMAL LOW (ref 12.0–15.0)

## 2021-03-17 LAB — PROTIME-INR
INR: 0.9 (ref 0.8–1.2)
Prothrombin Time: 12.6 seconds (ref 11.4–15.2)

## 2021-03-17 LAB — RESP PANEL BY RT-PCR (FLU A&B, COVID) ARPGX2
Influenza A by PCR: NEGATIVE
Influenza B by PCR: NEGATIVE
SARS Coronavirus 2 by RT PCR: NEGATIVE

## 2021-03-17 LAB — CBG MONITORING, ED: Glucose-Capillary: 112 mg/dL — ABNORMAL HIGH (ref 70–99)

## 2021-03-17 MED ORDER — ACETAMINOPHEN 325 MG PO TABS
650.0000 mg | ORAL_TABLET | Freq: Four times a day (QID) | ORAL | Status: DC | PRN
  Administered 2021-03-17 – 2021-03-20 (×6): 650 mg via ORAL
  Filled 2021-03-17 (×7): qty 2

## 2021-03-17 MED ORDER — SODIUM CHLORIDE 0.9 % IV SOLN
10.0000 mL/h | Freq: Once | INTRAVENOUS | Status: AC
Start: 2021-03-17 — End: 2021-03-17
  Administered 2021-03-17: 10 mL/h via INTRAVENOUS

## 2021-03-17 MED ORDER — SODIUM CHLORIDE 0.9 % IV BOLUS
500.0000 mL | Freq: Once | INTRAVENOUS | Status: AC
  Administered 2021-03-17: 500 mL via INTRAVENOUS

## 2021-03-17 MED ORDER — PANTOPRAZOLE SODIUM 40 MG IV SOLR
40.0000 mg | Freq: Two times a day (BID) | INTRAVENOUS | Status: DC
Start: 2021-03-17 — End: 2021-03-20
  Administered 2021-03-17 – 2021-03-20 (×6): 40 mg via INTRAVENOUS
  Filled 2021-03-17 (×6): qty 10

## 2021-03-17 MED ORDER — PRAMIPEXOLE DIHYDROCHLORIDE 0.25 MG PO TABS
0.2500 mg | ORAL_TABLET | Freq: Every day | ORAL | Status: DC
  Administered 2021-03-17 – 2021-03-19 (×3): 0.25 mg via ORAL
  Filled 2021-03-17 (×4): qty 1

## 2021-03-17 MED ORDER — PANTOPRAZOLE SODIUM 40 MG PO TBEC: 40.0000 mg | DELAYED_RELEASE_TABLET | Freq: Every day | ORAL | Status: DC

## 2021-03-17 MED ORDER — NICOTINE 14 MG/24HR TD PT24
14.0000 mg | MEDICATED_PATCH | Freq: Every day | TRANSDERMAL | Status: DC
  Administered 2021-03-17 – 2021-03-20 (×4): 14 mg via TRANSDERMAL
  Filled 2021-03-17 (×4): qty 1

## 2021-03-17 MED ORDER — LACTATED RINGERS IV BOLUS
1000.0000 mL | Freq: Once | INTRAVENOUS | Status: AC
  Administered 2021-03-17: 1000 mL via INTRAVENOUS

## 2021-03-17 MED ORDER — IOHEXOL 350 MG/ML SOLN
75.0000 mL | Freq: Once | INTRAVENOUS | Status: AC | PRN
Start: 2021-03-17 — End: 2021-03-17
  Administered 2021-03-17: 75 mL via INTRAVENOUS

## 2021-03-17 MED ORDER — PEG 3350-KCL-NA BICARB-NACL 420 G PO SOLR
4000.0000 mL | Freq: Once | ORAL | Status: AC
  Administered 2021-03-17: 4000 mL via ORAL

## 2021-03-17 MED ORDER — ONDANSETRON HCL 4 MG/2ML IJ SOLN
4.0000 mg | Freq: Four times a day (QID) | INTRAMUSCULAR | Status: DC | PRN
  Administered 2021-03-17 – 2021-03-19 (×2): 4 mg via INTRAVENOUS
  Filled 2021-03-17 (×2): qty 2

## 2021-03-17 MED ORDER — PRAVASTATIN SODIUM 10 MG PO TABS
20.0000 mg | ORAL_TABLET | Freq: Every day | ORAL | Status: DC
Start: 2021-03-17 — End: 2021-03-20
  Administered 2021-03-17 – 2021-03-19 (×3): 20 mg via ORAL
  Filled 2021-03-17 (×4): qty 2

## 2021-03-17 MED ORDER — FOLIC ACID 1 MG PO TABS
1.0000 mg | ORAL_TABLET | Freq: Every day | ORAL | Status: DC
  Administered 2021-03-17 – 2021-03-20 (×4): 1 mg via ORAL
  Filled 2021-03-17 (×4): qty 1

## 2021-03-17 NOTE — Progress Notes (Addendum)
Arrived to room with blood transfusing and no adverse reactions.  To have EGD and colonoscopy tomorrow and started on Gol[place] and clear liquid diet.  No bloody stools since admit to room.  Smokes 1/2 pack day and nicotine patch applied.  Has scabs in scalp from shingles in January and continues to have blurred vision from having shingles.  Bedside commode placed near bed. Consent signed and on chart

## 2021-03-17 NOTE — ED Provider Notes (Incomplete Revision)
[facility] EMERGENCY DEPARTMENT Provider Note   CSN: [id number] Arrival date & time: 03/17/21  8546     History  Chief Complaint  Patient presents with   Rectal Bleeding    Rhonda Briggs is a 71 y.o. female.   Rectal Bleeding Associated symptoms: abdominal pain and light-headedness   Patient woke last night with abdominal pain.  Developed rectal bleeding.  States it was red clots coming out of.  Also some liquid.  Had abdominal pain before that improved after having diarrhea.  Feels a little lightheaded.  States she has a chronic anemia.  recently had been diagnosed with shingles.  Not on blood thinners.  Has not had bleeding like this before.    Home Medications Prior to Admission medications   Medication Sig Start Date End Date Taking? Authorizing Provider  calcium gluconate 500 MG tablet Take 1 tablet by mouth 2 (two) times daily.    [provider]  cholecalciferol (VITAMIN D) 25 MCG (1000 UNIT) tablet Take 1,000 Units by mouth daily. Takes 2 a day    [provider]  folic acid (FOLVITE) 1 MG tablet Take 1 mg by mouth daily.    [provider]  hydrochlorothiazide (HYDRODIURIL) 25 MG tablet Take 25 mg by mouth daily.     [provider]  Multiple Vitamins-Minerals (CENTRUM SILVER 50+WOMEN PO) Take 1 tablet by mouth daily in the afternoon.    [provider]  pramipexole (MIRAPEX) 0.125 MG tablet Take 0.25 mg by mouth at bedtime.     [provider]  pravastatin (PRAVACHOL) 20 MG tablet Take 20 mg by mouth daily.    [provider]  predniSONE (DELTASONE) 10 MG tablet Take 1 tablet (10 mg total) by mouth daily. 11/09/19   Carole Civil, MD  VENTOLIN HFA 108 [phone] Base) MCG/ACT inhaler  02/10/18   [provider]      Allergies    Amoxicillin    Review of Systems   Review of Systems  Constitutional:  Negative for appetite change.  Respiratory:  Negative for shortness of breath.   Cardiovascular:   Negative for chest pain.  Gastrointestinal:  Positive for abdominal pain, anal bleeding and hematochezia.  Genitourinary:  Negative for enuresis.  Musculoskeletal:  Negative for back pain.  Skin:  Negative for rash.  Neurological:  Positive for light-headedness. Negative for weakness.   Physical Exam Updated Vital Signs BP 119/60    Pulse 87    Temp 97.7 F (36.5 C) (Oral)    Resp 19    Ht 5' (1.524 m)    Wt 73.9 kg    SpO2 96%    BMI 31.83 kg/m  Physical Exam Vitals and nursing note reviewed.  HENT:     Head: Normocephalic.  Cardiovascular:     Rate and Rhythm: Regular rhythm.  Pulmonary:     Breath sounds: No wheezing.  Abdominal:     Tenderness: There is no abdominal tenderness.  Genitourinary:    Comments: Dark red blood grossly on rectal exam. Musculoskeletal:        General: No tenderness.  Skin:    Capillary Refill: Capillary refill takes less than 2 seconds.     Coloration: Skin is not pale.  Neurological:     Mental Status: She is alert.    ED Results / Procedures / Treatments   Labs (all labs ordered are listed, but only abnormal results are displayed) Labs Reviewed  COMPREHENSIVE METABOLIC PANEL - Abnormal; Notable for the following  components:      Result Value   Glucose, Bld 150 (*)    BUN 32 (*)    Creatinine, Ser 1.65 (*)    Calcium 8.7 (*)    Total Protein 5.8 (*)    Albumin 3.4 (*)    AST 14 (*)    GFR, Estimated 33 (*)    All other components within normal limits  CBC WITH DIFFERENTIAL/PLATELET - Abnormal; Notable for the following components:   WBC 14.4 (*)    Hemoglobin 11.5 (*)    RDW 18.8 (*)    Platelets 449 (*)    Neutro Abs 10.4 (*)    Abs Immature Granulocytes 0.13 (*)    All other components within normal limits  HEMOGLOBIN AND HEMATOCRIT, BLOOD - Abnormal; Notable for the following components:   Hemoglobin 9.4 (*)    HCT 30.0 (*)    All other components within normal limits  CBG MONITORING, ED - Abnormal; Notable for the  following components:   Glucose-Capillary 112 (*)    All other components within normal limits  RESP PANEL BY RT-PCR (FLU A&B, COVID) ARPGX2  PROTIME-INR  TYPE AND SCREEN  PREPARE RBC (CROSSMATCH)    EKG None  Radiology No results found.  Procedures Procedures    Medications Ordered in ED Medications  0.9 %  sodium chloride infusion (has no administration in time range)  lactated ringers bolus 1,000 mL (0 mLs Intravenous Stopped 03/17/21 1159)  sodium chloride 0.9 % bolus 500 mL (500 mLs Intravenous New Bag/Given 03/17/21 1227)  iohexol (OMNIPAQUE) 350 MG/ML injection 75 mL (75 mLs Intravenous Contrast Given 03/17/21 1232)    ED Course/ Medical Decision Making/ A&P                           Medical Decision Making Amount and/or Complexity of Data Reviewed External Data Reviewed: notes.    Details: Pulmonology note Labs: ordered. Decision-making details documented in ED Course. Radiology: ordered and independent interpretation performed. Decision-making details documented in ED Course.  Risk Prescription drug management. Decision regarding hospitalization.  Critical Care Total time providing critical care: 30-74 minutes  Patient presents with GI bleed.  Had some abdominal pain and then bleeding began this morning.  Has had episodes of hypotension.  Hemoglobin initially reassuring.  At 11 which appear to be higher than her baseline.  Initial fluid bolus given and then had another hypotension.  Hemoglobin rechecked and has decreased by almost 2 g.  Blood pressure has now normalized with another 500 cc bolus.  However potentially could be active bleeding.  CT scan has been ordered and not resulted yet to attempt to determine source of bleeding.  Will require admission to the hospital.  Will discuss with hospitalist.  With the hypotension and decreased hemoglobin we will transfuse 1 unit for now        Final Clinical Impression(s) / ED Diagnoses Final diagnoses:  Lower  GI bleed  Anemia, unspecified type    Rx / DC Orders ED Discharge Orders     None         Davonna Belling, MD 03/17/21 1309

## 2021-03-17 NOTE — Assessment & Plan Note (Addendum)
-  Continue statins ?

## 2021-03-17 NOTE — ED Provider Notes (Addendum)
[facility] EMERGENCY DEPARTMENT Provider Note   CSN: [id number] Arrival date & time: 03/17/21  8099     History  Chief Complaint  Patient presents with   Rectal Bleeding    Emma-Lee Oddo is a 71 y.o. female.   Rectal Bleeding Associated symptoms: abdominal pain and light-headedness   Patient woke last night with abdominal pain.  Developed rectal bleeding.  States it was red clots coming out of.  Also some liquid.  Had abdominal pain before that improved after having diarrhea.  Feels a little lightheaded.  States she has a chronic anemia.  recently had been diagnosed with shingles.  Not on blood thinners.  Has not had bleeding like this before.    Home Medications Prior to Admission medications   Medication Sig Start Date End Date Taking? Authorizing Provider  Calcium Carb-Cholecalciferol (OYSTER SHELL CALCIUM W/D) 500-200 MG-UNIT TABS Take 1 tablet by mouth 2 (two) times daily. 10/07/20  Yes [provider]  calcium gluconate 500 MG tablet Take 1 tablet by mouth 2 (two) times daily.   Yes [provider]  cholecalciferol (VITAMIN D) 25 MCG (1000 UNIT) tablet Take 1,000 Units by mouth daily. Takes 2 a day   Yes [provider]  gabapentin (NEURONTIN) 100 MG capsule Take 100 mg by mouth in the morning, at noon, and at bedtime. 02/19/21  Yes [provider]  hydrochlorothiazide (HYDRODIURIL) 25 MG tablet Take 25 mg by mouth daily.    Yes [provider]  methotrexate (RHEUMATREX) 2.5 MG tablet Take 5 tablets by mouth once a week. 01/28/21  Yes [provider]  Multiple Vitamins-Minerals (CENTRUM SILVER 50+WOMEN PO) Take 1 tablet by mouth daily in the afternoon.   Yes [provider]  neomycin-polymyxin b-dexamethasone (MAXITROL) 3.5-10000-0.1 [place] 1 application into the right eye at bedtime. 03/06/21  Yes [provider]  pramipexole (MIRAPEX) 0.125 MG tablet Take 0.25 mg by mouth 2 (two) times daily as needed  (restless leg).   Yes [provider]  pravastatin (PRAVACHOL) 20 MG tablet Take 20 mg by mouth daily.   Yes [provider]  tobramycin-dexamethasone Baird Cancer) ophthalmic solution Place 1 drop into the right eye every 6 (six) hours. 03/06/21  Yes [provider]  valACYclovir (VALTREX) 1000 MG tablet Take 1,000 mg by mouth 2 (two) times daily.   Yes [provider]  VENTOLIN HFA 108 (90 Base) MCG/ACT inhaler Inhale 1-2 puffs into the lungs every 6 (six) hours as needed for wheezing or shortness of breath. 02/10/18  Yes [provider]      Allergies    Amoxicillin    Review of Systems   Review of Systems  Constitutional:  Negative for appetite change.  Respiratory:  Negative for shortness of breath.   Cardiovascular:  Negative for chest pain.  Gastrointestinal:  Positive for abdominal pain, anal bleeding and hematochezia.  Genitourinary:  Negative for enuresis.  Musculoskeletal:  Negative for back pain.  Skin:  Negative for rash.  Neurological:  Positive for light-headedness. Negative for weakness.   Physical Exam Updated Vital Signs BP 99/66 (BP Location: Right Arm)    Pulse (!) 101    Temp 97.6 F (36.4 C)    Resp 16    Ht 4\' 11"  (1.499 m)    Wt 74.8 kg    SpO2 100%    BMI 33.31 kg/m  Physical Exam Vitals and nursing note reviewed.  HENT:     Head: Normocephalic.  Cardiovascular:  Rate and Rhythm: Regular rhythm.  Pulmonary:     Breath sounds: No wheezing.  Abdominal:     Tenderness: There is no abdominal tenderness.  Genitourinary:    Comments: Dark red blood grossly on rectal exam. Musculoskeletal:        General: No tenderness.  Skin:    Capillary Refill: Capillary refill takes less than 2 seconds.     Coloration: Skin is not pale.  Neurological:     Mental Status: She is alert.    ED Results / Procedures / Treatments   Labs (all labs ordered are listed, but only abnormal results are displayed) Labs Reviewed   COMPREHENSIVE METABOLIC PANEL - Abnormal; Notable for the following components:      Result Value   Glucose, Bld 150 (*)    BUN 32 (*)    Creatinine, Ser 1.65 (*)    Calcium 8.7 (*)    Total Protein 5.8 (*)    Albumin 3.4 (*)    AST 14 (*)    GFR, Estimated 33 (*)    All other components within normal limits  CBC WITH DIFFERENTIAL/PLATELET - Abnormal; Notable for the following components:   WBC 14.4 (*)    Hemoglobin 11.5 (*)    RDW 18.8 (*)    Platelets 449 (*)    Neutro Abs 10.4 (*)    Abs Immature Granulocytes 0.13 (*)    All other components within normal limits  HEMOGLOBIN AND HEMATOCRIT, BLOOD - Abnormal; Notable for the following components:   Hemoglobin 9.4 (*)    HCT 30.0 (*)    All other components within normal limits  CBC - Abnormal; Notable for the following components:   WBC 11.6 (*)    RBC 3.62 (*)    Hemoglobin 10.4 (*)    HCT 32.9 (*)    RDW 17.8 (*)    All other components within normal limits  BASIC METABOLIC PANEL - Abnormal; Notable for the following components:   Creatinine, Ser 1.26 (*)    Calcium 8.2 (*)    GFR, Estimated 46 (*)    All other components within normal limits  CBG MONITORING, ED - Abnormal; Notable for the following components:   Glucose-Capillary 112 (*)    All other components within normal limits  RESP PANEL BY RT-PCR (FLU A&B, COVID) ARPGX2  PROTIME-INR  HIV ANTIBODY (ROUTINE TESTING W REFLEX)  TYPE AND SCREEN  PREPARE RBC (CROSSMATCH)    EKG None  Radiology CT ANGIO GI BLEED  Result Date: 03/17/2021 CLINICAL DATA:  A 71 year old presents with rectal bleeding. EXAM: CTA ABDOMEN AND PELVIS WITHOUT AND WITH CONTRAST TECHNIQUE: Multidetector CT imaging of the abdomen and pelvis was performed using the standard protocol during bolus administration of intravenous contrast. Multiplanar reconstructed images and MIPs were obtained and reviewed to evaluate the vascular anatomy. RADIATION DOSE REDUCTION: This exam was performed  according to the departmental dose-optimization program which includes automated exposure control, adjustment of the mA and/or kV according to patient size and/or use of iterative reconstruction technique. CONTRAST:  101mL OMNIPAQUE IOHEXOL 350 MG/ML SOLN COMPARISON:  None FINDINGS: VASCULAR Aorta: Calcified and noncalcified atheromatous plaque throughout the abdominal aorta. Maximal caliber of the infrarenal abdominal aorta is 2.5 cm, nonaneurysmal. No signs of stranding adjacent to the abdominal aorta. Celiac: Calcific and noncalcified plaque near the origin without substantial narrowing. No signs of stranding or dissection. No aneurysmal dilation. SMA: Widely patent without signs of dissection or aneurysm. Atheromatous plaque near the origin of the celiac as  well. Renals: Single renal arteries bilaterally which are patent. IMA: Patent without evidence of aneurysm, dissection, vasculitis or significant stenosis. Inflow: Patent without evidence of aneurysm, dissection, vasculitis or significant stenosis. Proximal Outflow: Bilateral common femoral and visualized portions of the superficial and profunda femoral arteries are patent without evidence of aneurysm, dissection, vasculitis or significant stenosis. Veins: No obvious venous abnormality within the limitations of this arterial phase study. Review of the MIP images confirms the above findings. NON-VASCULAR Lower chest: No effusion.  No consolidation. Hepatobiliary: No focal liver abnormality is seen. No gallstones, gallbladder wall thickening, or biliary dilatation. Pancreas: Normal, without mass, inflammation or ductal dilatation. Spleen: Normal. Adrenals/Urinary Tract: Mild cortical scarring and parenchymal loss of the bilateral kidneys without signs of hydronephrosis. No perivesical stranding. No nephrolithiasis or suspicious renal lesion. Potential subtle striated nephrogram bilaterally. Stomach/Bowel: Mild gastric thickening. No visible areas to suggest  active extravasation in the gastrointestinal tract. While no definite active extravasation is seen in the GI tract there is a prominent vessel that enters the stomach (image 30/11) this is near the mid body along the greater curvature and is also seen along that proximal body at the junction of body and fundus on the coronal images image 36/13. This does not appear to change in there is no high density material in the lumen currently to suggest active bleeding in this area. Moderate diverticular disease and diverticulosis of the sigmoid. No surrounding stranding. No perienteric stranding. Lymphatic: No adenopathy in the abdomen or in the pelvis. Reproductive: Unremarkable. Other: No pneumatosis.  No free air.  No ascites. Musculoskeletal: No acute or significant osseous findings. IMPRESSION: 1. Prominent vessel that appears to penetrate the wall of the stomach near the fundal/body junction along the greater curvature suspicious for Dieulafoy's lesion, a dilated vessel that can lead to gastrointestinal bleeding, but without definitive signs of active extravasation at this time. This may be incidental or could be related to current presentation. Suggest follow-up endoscopy for further evaluation. 2. Signs of sigmoid diverticular disease without visible signs of extravasation. Nephrogram could be related to scarring but would correlate with any clinical or laboratory evidence of urinary tract infection/pyelonephritis. 3. Subtle striated 4. Aortic atherosclerosis. Aortic Atherosclerosis (ICD10-I70.0). Electronically Signed   By: Zetta Bills M.D.   On: 03/17/2021 13:24    Procedures Procedures    Medications Ordered in ED Medications  acetaminophen (TYLENOL) tablet 650 mg (650 mg Oral Given 03/18/21 0517)  ondansetron (ZOFRAN) injection 4 mg (4 mg Intravenous Given 03/17/21 1735)  pravastatin (PRAVACHOL) tablet 20 mg (20 mg Oral Given 5/46/27 0350)  folic acid (FOLVITE) tablet 1 mg (1 mg Oral Given 03/18/21  0929)  pramipexole (MIRAPEX) tablet 0.25 mg (0.25 mg Oral Given 03/17/21 2212)  pantoprazole (PROTONIX) injection 40 mg (40 mg Intravenous Given 03/18/21 0928)  nicotine (NICODERM CQ - dosed in mg/24 hours) patch 14 mg (14 mg Transdermal Patch Applied 03/18/21 0930)  lactated ringers bolus 1,000 mL (0 mLs Intravenous Stopped 03/17/21 1159)  sodium chloride 0.9 % bolus 500 mL (0 mLs Intravenous Stopped 03/17/21 1329)  iohexol (OMNIPAQUE) 350 MG/ML injection 75 mL (75 mLs Intravenous Contrast Given 03/17/21 1232)  0.9 %  sodium chloride infusion (0 mL/hr Intravenous Stopped 03/17/21 2209)  polyethylene glycol-electrolytes (NuLYTELY) solution 4,000 mL (4,000 mLs Oral Given 03/17/21 1714)    ED Course/ Medical Decision Making/ A&P  Medical Decision Making Amount and/or Complexity of Data Reviewed External Data Reviewed: notes.    Details: Pulmonology note Labs: ordered. Decision-making details documented in ED Course. Radiology: ordered and independent interpretation performed. Decision-making details documented in ED Course.  Risk Prescription drug management. Decision regarding hospitalization.  Critical Care Total time providing critical care: 30-74 minutes  Patient presents with GI bleed.  Had some abdominal pain and then bleeding began this morning.  Has had episodes of hypotension.  Hemoglobin initially reassuring.  At 11 which appear to be higher than her baseline.  Initial fluid bolus given and then had another hypotension.  Hemoglobin rechecked and has decreased by almost 2 g.  Blood pressure has now normalized with another 500 cc bolus.  However potentially could be active bleeding.  CT scan has been ordered and not resulted yet to attempt to determine source of bleeding.  Will require admission to the hospital.  Will discuss with hospitalist.  With the hypotension and decreased hemoglobin we will transfuse 1 unit for now        Final Clinical  Impression(s) / ED Diagnoses Final diagnoses:  Lower GI bleed  Anemia, unspecified type    Rx / DC Orders ED Discharge Orders     None         Davonna Belling, MD 03/17/21 [place], Aaryana Betke, MD 03/18/21 [phone]

## 2021-03-17 NOTE — ED Notes (Signed)
EDP made aware of pt's BP 

## 2021-03-17 NOTE — Assessment & Plan Note (Addendum)
-  Status post bilateral knee replacement -Continue as needed analgesics -Minimize/avoid the use of NSAIDs. -Patient reports pain to be overall stable.

## 2021-03-17 NOTE — Assessment & Plan Note (Addendum)
-  Chronic stage IIIb renal failure; appears to be associated with chronic hypertension. -No recent creatinine for comparison of stability -Based on GFR patient stage IIIb status. -Continue to minimize nephrotoxic agents, maintain adequate hydration and avoid the use of contrast/avoid hypotension. -Repeat basic metabolic panel to follow renal function trend.

## 2021-03-17 NOTE — ED Triage Notes (Addendum)
Pt arrived via EMS with complaints of rectal bleeding that started today at 0530. Pt states stool had large dark red clots and red liquid. Denies abdominal pain at this time. Pt dx with shingles 02/04/21.

## 2021-03-17 NOTE — ED Notes (Signed)
Patient transported to CT 

## 2021-03-17 NOTE — Assessment & Plan Note (Addendum)
-   Patient presenting with abdominal pain follow by GI bleed suggesting ischemic colitis, other considerations could be malignancy, polyps or diverticulosis.  -Status post EGD and colonoscopy; will follow results and further recommendations by GI service. -1 unit of PRBC was transfused at time of admission -Hemoglobin 10.4  -Patient continue experiencing lower GI bleed episodes throughout the night.   -Hemodynamically stable currently and denying lightheadedness, dizziness or shortness of breath.   -Continue PPI.

## 2021-03-17 NOTE — ED Notes (Signed)
This nurse attempted IV access x2. Second nurse attempting at this time.

## 2021-03-17 NOTE — Assessment & Plan Note (Addendum)
-  Soft blood pressure at time of admission -Blood pressure slightly rising currently; still for control. -Continue holding antihypertensive agents. -Follow vital signs.

## 2021-03-17 NOTE — H&P (Signed)
History and Physical    Patient: Rhonda Briggs TJQ:[id number] DOB: January 29, 1950 DOA: 03/17/2021 DOS: the patient was seen and examined on 03/17/2021 PCP: Danna Hefty, DO  Patient coming from: Home  Chief Complaint:  Chief Complaint  Patient presents with   Rectal Bleeding    HPI: Rhonda Briggs is a 71 y.o. female with medical history significant of tobacco abuse, anemia of chronic kidney disease, chronic kidney disease a stage IIIb, hypertension, hyperlipidemia, osteoporosis and arthritis; presented to the emergency department secondary to abdominal pain and lower GI bleed.  Patient reports waking up due to abdominal discomfort followed by dark red clots and bright red blood diarrhea.  Patient reports abdominal discomfort improved/resolved after bowel movement.  Rectal bleeding appreciated in the ED. Patient expressed associated lightheadedness, dizziness and mild palpitations.  In the ED blood pressure found to be soft.  She responded to fluid resuscitation.  Negative COVID PCR; hemoglobin 11.5 on arrival with subsequent drop to 9.4 almost 3 hours later.  Patient reported family history of colon cancer in her dad and has never had any screening work-up.  Of note; she has had recent diagnosis of shingles in her temporal region for what she has been using NSAIDs products and Tylenol to assist with pain.  Review of Systems: As mentioned in the history of present illness. All other systems reviewed and are negative. Past Medical History:  Diagnosis Date   Anemia    Arthritis    History of kidney stones    Hyperlipidemia    Hypertension    Osteoporosis    PONV (postoperative nausea and vomiting)    after hernia surgery   Past Surgical History:  Procedure Laterality Date   CARPAL TUNNEL RELEASE Right 2011   HERNIA REPAIR Left    inguinal   TOTAL KNEE ARTHROPLASTY Right 08/19/2016   Procedure: TOTAL KNEE ARTHROPLASTY;  Surgeon: Carole Civil, MD;  Location: [facility];   Service: Orthopedics;  Laterality: Right;   TOTAL KNEE ARTHROPLASTY Left 12/31/2016   Procedure: TOTAL KNEE ARTHROPLASTY;  Surgeon: Carole Civil, MD;  Location: [facility];  Service: Orthopedics;  Laterality: Left;   TUBAL LIGATION     Social History:  reports that she has been smoking cigarettes. She has a 24.00 pack-year smoking history. She has never used smokeless tobacco. She reports that she does not drink alcohol and does not use drugs.  Allergies  Allergen Reactions   Amoxicillin Nausea And Vomiting    Family History  Problem Relation Age of Onset   Hypertension Mother    Lung disease Mother    Cancer Father    Urticaria Neg Hx    Eczema Neg Hx    Immunodeficiency Neg Hx    Atopy Neg Hx    Asthma Neg Hx    Angioedema Neg Hx    Allergic rhinitis Neg Hx     Prior to Admission medications   Medication Sig Start Date End Date Taking? Authorizing Provider  Calcium Carb-Cholecalciferol (OYSTER SHELL CALCIUM W/D) 500-200 MG-UNIT TABS Take 1 tablet by mouth 2 (two) times daily. 10/07/20  Yes [provider]  calcium gluconate 500 MG tablet Take 1 tablet by mouth 2 (two) times daily.   Yes [provider]  cholecalciferol (VITAMIN D) 25 MCG (1000 UNIT) tablet Take 1,000 Units by mouth daily. Takes 2 a day   Yes [provider]  gabapentin (NEURONTIN) 100 MG capsule Take 100 mg by mouth in the morning, at noon, and at bedtime. 02/19/21  Yes [provider]  hydrochlorothiazide (HYDRODIURIL) 25 MG tablet Take 25 mg by mouth daily.    Yes [provider]  methotrexate (RHEUMATREX) 2.5 MG tablet Take 5 tablets by mouth once a week. 01/28/21  Yes [provider]  Multiple Vitamins-Minerals (CENTRUM SILVER 50+WOMEN PO) Take 1 tablet by mouth daily in the afternoon.   Yes [provider]  neomycin-polymyxin b-dexamethasone (MAXITROL) 3.5-10000-0.1 [place] 1 application into the right eye at bedtime. 03/06/21  Yes [provider]  pramipexole (MIRAPEX) 0.125 MG tablet Take 0.25 mg by mouth 2 (two) times daily as needed (restless leg).   Yes [provider]  pravastatin (PRAVACHOL) 20 MG tablet Take 20 mg by mouth daily.   Yes [provider]  tobramycin-dexamethasone [facility]) ophthalmic solution Place 1 drop into the right eye every 6 (six) hours. 03/06/21  Yes [provider]  valACYclovir (VALTREX) 1000 MG tablet Take 1,000 mg by mouth 2 (two) times daily.   Yes [provider]  VENTOLIN HFA 108 (90 Base) MCG/ACT inhaler Inhale 1-2 puffs into the lungs every 6 (six) hours as needed for wheezing or shortness of breath. 02/10/18  Yes [provider]    Physical Exam: Vitals:   03/17/21 1546 03/17/21 1600 03/17/21 1610 03/17/21 1827  BP: (!) 127/51 116/67 (!) 111/49 109/74  Pulse: 84 81 84 95  Resp: 16 19 18 16   Temp: 97.9 F (36.6 C)  98 F (36.7 C) 98.6 F (37 C)  TempSrc: Oral  Oral   SpO2: 98% 97% 98% 100%  Weight:      Height:       General exam: Alert, awake, oriented x 3; no chest pain, no nausea or vomiting.  Patient is afebrile. Respiratory system: Positive scattered rhonchi; no wheezing.  Respiratory effort normal.  No using accessory muscles.  No crackles on exam. Cardiovascular system:RRR. No murmurs, rubs, gallops.  No JVD. Gastrointestinal system: Abdomen is nondistended, soft and nontender to palpation currently. No organomegaly or masses felt. Normal bowel sounds heard. Central nervous system: Alert and oriented. No focal neurological deficits. Extremities: No cyanosis or clubbing. Skin: No petechiae. Psychiatry: Judgement and insight appear normal. Mood & affect appropriate.    Data Reviewed: CT angiogram demonstrating prominent vessel along the greater curvature of the stomach suspicious for Dieulafoy's  lesion without signs of active extravasation.  Sigmoid diverticular disease without extravasation. Hemoglobin 11.5 on arrival;  repeat level 9.4. INR 0.9. Patient was intermittently hypotensive, express feeling dizzy, lightheaded and with palpitations.  1 unit of PRBCs requested for transfusion.  Assessment and Plan: * Lower GI bleed- (present on admission) - With concerns for abdominal pain follow by GI bleed. -Ischemic colitis or rapidly upper GI bleed in the diagnosis. -1 unit PRBCs has been requested and transfused; patient with underlying history of chronic anemia disease due to renal failure. -Follow hemoglobin trend -Continue PPI -Clear liquid diet and n.p.o. after midnight. -GI service has been consulted and given lack of prior screening, recent use of NSAIDs and presentation for what appears to be ischemic colitis will benefit of EGD and colonoscopy evaluation. -Will follow further recommendations. -Patient is hemodynamically stable currently.  Chronic kidney disease, stage III (moderate) (HCC)- (present on admission) -Chronic stage IIIb renal failure; appears to be associated with chronic hypertension. -No recent creatinine for comparison of eye stability -Based on GFR patient stage IIIb status. -Continue to minimize nephrotoxic agents, follow-up renal function trend, maintain adequate hydration, avoid the use of contrast and hypotension.  Essential hypertension- (present on admission) - So blood pressure at time of admission -Holding antihypertensive agents currently -IV fluids has been provided -Will follow blood pressure status.  Hyperlipidemia- (present on admission) - Continue statins.  Cigarette smoker- (present on admission) -Cessation counseling has been provided -Nicotine patch has been ordered.  Restless leg syndrome- (present on admission) -Continue the use of Mirapex  Obesity, Class I, BMI 30-34.9- (present on admission) -Body mass index is 33.31 kg/m. -Low calorie diet, portion control and increase physical activity discussed with patient.  History of osteoarthritis -Status  post bilateral knee replacement -Continue as needed analgesics -Minimize/avoid the use of NSAIDs.  COPD  GOLD II- (present on admission) -No shortness of breath or wheezing -Will use as needed bronchodilators -Smoking cessation provided.    Advance Care Planning:   Code Status: Full Code   Consults: Gastroenterology service  Family Communication: No family at bedside.  Severity of Illness: The appropriate patient status for this patient is OBSERVATION. Observation status is judged to be reasonable and necessary in order to provide the required intensity of service to ensure the patient's safety. The patient's presenting symptoms, physical exam findings, and initial radiographic and laboratory data in the context of their medical condition is felt to place them at decreased risk for further clinical deterioration. Furthermore, it is anticipated that the patient will be medically stable for discharge from the hospital within 2 midnights of admission.   Author: Barton Dubois, MD 03/17/2021 6:46 PM  For on call review [email or web address].

## 2021-03-17 NOTE — Assessment & Plan Note (Signed)
-  Body mass index is 33.31 kg/m. -Low calorie diet, portion control and increase physical activity discussed with patient. 

## 2021-03-17 NOTE — Assessment & Plan Note (Addendum)
-  No shortness of breath or wheezing -Continue to use bronchodilators if needed. -Smoking cessation provided.

## 2021-03-17 NOTE — Assessment & Plan Note (Signed)
-  Continue the use of Mirapex

## 2021-03-17 NOTE — Consult Note (Signed)
Gastroenterology Consult   Referring Provider: No ref. provider found Primary Care Physician:  Danna Hefty, DO Primary Gastroenterologist:  Dr. Abbey Chatters (new patient)  Patient ID: Sukanya Goldblatt; [id number]; 26-Apr-1950   Admit date: 03/17/2021  LOS: 0 days   Date of Consultation: 03/17/2021  Reason for Consultation:  Lower GI bleed  History of Present Illness   January Bergthold is a 71 y.o. year old female with a history of chronic anemia, hyperlipidemia, HTN, kidney stones, and arthritis. She was brought in today by EMS for rectal bleeding. On presentation she reported that her pain began at 0530 this morning and then noticed dark red clots with some liquid and abdominal pain improved after defecation but was lightheaded also.   ED Course CT Angio GI Bleed -prominent vessel along the greater curvature of the stomach suspicious for Dieulafoy's lesion without signs of active extravasation.  Sigmoid diverticular disease without extravasation. Hgb 11.5 on arrival, dropped to 9.4 almost 3 hours later; INR 0.9 Intermittent hypotension -received 1.5 L of fluid, currently waiting to receive 1 unit of blood.   Consult In review of the chart and care everywhere, it appears she has never seen GI and has never had a colonoscopy. Patient confirmed this and stated she never wanted to drink the stuff. She denies hematemesis. She reported that she had cramping abdominal pain with the feeling that she needed to use the bathroom.  She reports she had a large dark red clot followed by watery bright red blood.  She does report dizziness.  She stated that she does still feel dizzy even from going from lying to sitting in the stretcher. She reports after her bowel movement she did not have any more pain, and she has not had any more bowel movements since.  She was recently diagnosed with shingles in January along her temporal region.  She reports that she has had headaches consistently since then for which she  has been treating with alternating Tylenol, NSAID products, BC or Goody powders almost daily.  She reports she is chronically anemic however does not know what her baseline hemoglobin is and her most recent lab work is followed by a physician in [place].   Family history: Metastatic colon cancer in her father. She stated that he began to have abdominal pain and had some bleeding and went to [facility] to be seen and he was found to have cancer after having some tissue samples taken from his colon.  Daughter Olivia Mackie present.  Past Medical History:  Diagnosis Date   Anemia    Arthritis    History of kidney stones    Hyperlipidemia    Hypertension    Osteoporosis    PONV (postoperative nausea and vomiting)    after hernia surgery    Past Surgical History:  Procedure Laterality Date   CARPAL TUNNEL RELEASE Right 2011   HERNIA REPAIR Left    inguinal   TOTAL KNEE ARTHROPLASTY Right 08/19/2016   Procedure: TOTAL KNEE ARTHROPLASTY;  Surgeon: Carole Civil, MD;  Location: [facility];  Service: Orthopedics;  Laterality: Right;   TOTAL KNEE ARTHROPLASTY Left 12/31/2016   Procedure: TOTAL KNEE ARTHROPLASTY;  Surgeon: Carole Civil, MD;  Location: [facility];  Service: Orthopedics;  Laterality: Left;   TUBAL LIGATION      Prior to Admission medications   Medication Sig Start Date End Date Taking? Authorizing Provider  calcium gluconate 500 MG tablet Take 1 tablet by mouth 2 (two) times daily.    [provider]  cholecalciferol (VITAMIN D) 25 MCG (1000 UNIT) tablet Take 1,000 Units by mouth daily. Takes 2 a day    [provider]  folic acid (FOLVITE) 1 MG tablet Take 1 mg by mouth daily.    [provider]  hydrochlorothiazide (HYDRODIURIL) 25 MG tablet Take 25 mg by mouth daily.     [provider]  Multiple Vitamins-Minerals (CENTRUM SILVER 50+WOMEN PO) Take 1 tablet by mouth daily in the afternoon.    [provider]  pramipexole (MIRAPEX) 0.125 MG  tablet Take 0.25 mg by mouth at bedtime.     [provider]  pravastatin (PRAVACHOL) 20 MG tablet Take 20 mg by mouth daily.    [provider]  VENTOLIN HFA 108 [phone] Base) MCG/ACT inhaler  02/10/18   [provider]    Current Facility-Administered Medications  Medication Dose Route Frequency Provider Last Rate Last Admin   0.9 %  sodium chloride infusion  10 mL/hr Intravenous Once Davonna Belling, MD       acetaminophen (TYLENOL) tablet 650 mg  650 mg Oral Q6H PRN Barton Dubois, MD       folic acid (FOLVITE) tablet 1 mg  1 mg Oral Daily Barton Dubois, MD       ondansetron [facility]) injection 4 mg  4 mg Intravenous Q6H PRN Barton Dubois, MD       pantoprazole (PROTONIX) EC tablet 40 mg  40 mg Oral Daily Barton Dubois, MD       pramipexole (MIRAPEX) tablet 0.25 mg  0.25 mg Oral QHS Barton Dubois, MD       pravastatin (PRAVACHOL) tablet 20 mg  20 mg Oral q1800 Barton Dubois, MD       Current Outpatient Medications  Medication Sig Dispense Refill   calcium gluconate 500 MG tablet Take 1 tablet by mouth 2 (two) times daily.     cholecalciferol (VITAMIN D) 25 MCG (1000 UNIT) tablet Take 1,000 Units by mouth daily. Takes 2 a day     folic acid (FOLVITE) 1 MG tablet Take 1 mg by mouth daily.     hydrochlorothiazide (HYDRODIURIL) 25 MG tablet Take 25 mg by mouth daily.      Multiple Vitamins-Minerals (CENTRUM SILVER 50+WOMEN PO) Take 1 tablet by mouth daily in the afternoon.     pramipexole (MIRAPEX) 0.125 MG tablet Take 0.25 mg by mouth at bedtime.      pravastatin (PRAVACHOL) 20 MG tablet Take 20 mg by mouth daily.     VENTOLIN HFA 108 (90 Base) MCG/ACT inhaler       Allergies as of 03/17/2021 - Review Complete 03/17/2021  Allergen Reaction Noted   Amoxicillin Nausea And Vomiting 03/17/2021    Family History  Problem Relation Age of Onset   Hypertension Mother    Lung disease Mother    Cancer Father    Urticaria Neg Hx    Eczema Neg Hx     Immunodeficiency Neg Hx    Atopy Neg Hx    Asthma Neg Hx    Angioedema Neg Hx    Allergic rhinitis Neg Hx     Social History   Socioeconomic History   Marital status: Widowed    Spouse name: Not on file   Number of children: Not on file   Years of education: Not on file   Highest education level: Not on file  Occupational History   Not on file  Tobacco Use   Smoking status: Every Day    Packs/day:  0.50    Years: 48.00    Pack years: 24.00    Types: Cigarettes   Smokeless tobacco: Never   Tobacco comments:    smokes 10 cigarettes per day 03/05/2020  Vaping Use   Vaping Use: Never used  Substance and Sexual Activity   Alcohol use: No   Drug use: No   Sexual activity: Yes    Birth control/protection: Surgical  Other Topics Concern   Not on file  Social History Narrative   Not on file   Social Determinants of Health   Financial Resource Strain: Not on file  Food Insecurity: Not on file  Transportation Needs: Not on file  Physical Activity: Not on file  Stress: Not on file  Social Connections: Not on file  Intimate Partner Violence: Not on file     Review of Systems   Gen: Denies any fever, chills. CV: Denies chest pain, heart palpitations, syncope, edema  Resp: Denies shortness of breath with rest, cough, wheezing, coughing up blood, and pleurisy. GI: see HPI MS: Denies joint pain, limitation of movement, swelling, cramps, and atrophy.  Derm: Denies rash, itching, dry skin, hives. Psych: Denies depression, anxiety, memory loss, hallucinations, and confusion. Heme: Frequent bruising Neuro:  Denies any paresthesias, shaking. + dizziness and headaches  Physical Exam   Vital Signs in last 24 hours: Temp:  [97.7 F (36.5 C)] 97.7 F (36.5 C) (02/20 0948) Pulse Rate:  [72-96] 87 (02/20 1300) Resp:  [15-21] 19 (02/20 1300) BP: (96-119)/(45-84) 119/60 (02/20 1300) SpO2:  [94 %-99 %] 96 % (02/20 1300) Weight:  [73.9 kg] 73.9 kg (02/20 0943)    General:    Alert,  Well-developed, well-nourished, pleasant and cooperative in NAD Head:  Normocephalic and atraumatic. Eyes:  Sclera clear, no icterus. Ears:  Normal auditory acuity. Heart:  Regular rate and rhythm;  Abdomen:  Soft, nontender and nondistended. No masses, hepatosplenomegaly or hernias noted. Normal bowel sounds, without guarding, and without rebound.   Rectal: deferred Msk:  Symmetrical without gross deformities. Neurologic:  Alert and  oriented x4. Skin: Bruising noted to BUE. Psych:  Alert and cooperative. Normal mood and affect.  Intake/Output from previous day: No intake/output data recorded. Intake/Output this shift: Total I/O In: 1500 [IV Piggyback:1500] Out: -    Labs/Studies   Recent Labs Recent Labs    03/17/21 1000 03/17/21 1249  WBC 14.4*  --   HGB 11.5* 9.4*  HCT 36.5 30.0*  PLT 449*  --    BMET Recent Labs    03/17/21 1000  NA 137  K 4.1  CL 99  CO2 27  GLUCOSE 150*  BUN 32*  CREATININE 1.65*  CALCIUM 8.7*   LFT Recent Labs    03/17/21 1000  PROT 5.8*  ALBUMIN 3.4*  AST 14*  ALT 14  ALKPHOS 47  BILITOT 0.6   PT/INR Recent Labs    03/17/21 1000  LABPROT 12.6  INR 0.9   Hepatitis Panel No results for input(s): HEPBSAG, HCVAB, HEPAIGM, HEPBIGM in the last 72 hours. C-Diff No results for input(s): CDIFFTOX in the last 72 hours.  Radiology/Studies CT ANGIO GI BLEED  Result Date: 03/17/2021 CLINICAL DATA:  A 71 year old presents with rectal bleeding. EXAM: CTA ABDOMEN AND PELVIS WITHOUT AND WITH CONTRAST TECHNIQUE: Multidetector CT imaging of the abdomen and pelvis was performed using the standard protocol during bolus administration of intravenous contrast. Multiplanar reconstructed images and MIPs were obtained and reviewed to evaluate the vascular anatomy. RADIATION DOSE REDUCTION: This exam was performed  according to the departmental dose-optimization program which includes automated exposure control, adjustment of the mA and/or  kV according to patient size and/or use of iterative reconstruction technique. CONTRAST:  61mL OMNIPAQUE IOHEXOL 350 MG/ML SOLN COMPARISON:  None FINDINGS: VASCULAR Aorta: Calcified and noncalcified atheromatous plaque throughout the abdominal aorta. Maximal caliber of the infrarenal abdominal aorta is 2.5 cm, nonaneurysmal. No signs of stranding adjacent to the abdominal aorta. Celiac: Calcific and noncalcified plaque near the origin without substantial narrowing. No signs of stranding or dissection. No aneurysmal dilation. SMA: Widely patent without signs of dissection or aneurysm. Atheromatous plaque near the origin of the celiac as well. Renals: Single renal arteries bilaterally which are patent. IMA: Patent without evidence of aneurysm, dissection, vasculitis or significant stenosis. Inflow: Patent without evidence of aneurysm, dissection, vasculitis or significant stenosis. Proximal Outflow: Bilateral common femoral and visualized portions of the superficial and profunda femoral arteries are patent without evidence of aneurysm, dissection, vasculitis or significant stenosis. Veins: No obvious venous abnormality within the limitations of this arterial phase study. Review of the MIP images confirms the above findings. NON-VASCULAR Lower chest: No effusion.  No consolidation. Hepatobiliary: No focal liver abnormality is seen. No gallstones, gallbladder wall thickening, or biliary dilatation. Pancreas: Normal, without mass, inflammation or ductal dilatation. Spleen: Normal. Adrenals/Urinary Tract: Mild cortical scarring and parenchymal loss of the bilateral kidneys without signs of hydronephrosis. No perivesical stranding. No nephrolithiasis or suspicious renal lesion. Potential subtle striated nephrogram bilaterally. Stomach/Bowel: Mild gastric thickening. No visible areas to suggest active extravasation in the gastrointestinal tract. While no definite active extravasation is seen in the GI tract there is a  prominent vessel that enters the stomach (image 30/11) this is near the mid body along the greater curvature and is also seen along that proximal body at the junction of body and fundus on the coronal images image 36/13. This does not appear to change in there is no high density material in the lumen currently to suggest active bleeding in this area. Moderate diverticular disease and diverticulosis of the sigmoid. No surrounding stranding. No perienteric stranding. Lymphatic: No adenopathy in the abdomen or in the pelvis. Reproductive: Unremarkable. Other: No pneumatosis.  No free air.  No ascites. Musculoskeletal: No acute or significant osseous findings. IMPRESSION: 1. Prominent vessel that appears to penetrate the wall of the stomach near the fundal/body junction along the greater curvature suspicious for Dieulafoy's lesion, a dilated vessel that can lead to gastrointestinal bleeding, but without definitive signs of active extravasation at this time. This may be incidental or could be related to current presentation. Suggest follow-up endoscopy for further evaluation. 2. Signs of sigmoid diverticular disease without visible signs of extravasation. Nephrogram could be related to scarring but would correlate with any clinical or laboratory evidence of urinary tract infection/pyelonephritis. 3. Subtle striated 4. Aortic atherosclerosis. Aortic Atherosclerosis (ICD10-I70.0). Electronically Signed   By: Zetta Bills M.D.   On: 03/17/2021 13:24     Assessment   Tenesia Escudero is a 71 y.o. year old female with a history of chronic anemia, hyperlipidemia, HTN, kidney stones, and arthritis. She was brought in by EMS for rectal bleeding. Hgb 11.5 on presentation then dropped to 9.4 with no evidence of active extravasation on CT angio. GI was consulted for lower GI bleed.   Lower GI Bleed: CT angio with incidental prominent vessel suspicious for Dieulafoy's lesion.Given symptomatic presentation she will receive 1  unit PRBC and will monitor H/H. She has not continued to have any active rectal bleeding  with only the one occurrence this morning. Due to her history of anemia, family history of colon cancer, and the fact that she has never had a colonoscopy, along with recent daily use of NSAIDs she will need EGD and colonoscopy for further evaluation of bleeding tomorrow. Patient is agreeable. Protonix 40 mg BID.  Chronic Anemia: Patient is chronically anemic although unsure of baseline Hgb. Should consider iron studies and have follow up outpatient as patient is getting transfused.     Plan / Recommendations   Trend H/H, transfuse for Hgb <7.  Proceed with upper endoscopy and colonoscopy. by Dr. Jenetta Downer in near future: the risks, benefits, and alternatives have been discussed with the patient in detail. The patient states understanding and desires to proceed.  Protonix 40 mg IV BID.  Clear liquids now, NPO after midnight Consider following up iron studies outpatient.    03/17/2021, 1:52 PM  Venetia Night, MSN, FNP-BC, AGACNP-BC [facility]

## 2021-03-17 NOTE — Assessment & Plan Note (Signed)
-  Cessation counseling has been provided -Nicotine patch has been ordered.

## 2021-03-17 NOTE — ED Notes (Addendum)
Lab called at this time for update on RBC for transfusion.

## 2021-03-18 ENCOUNTER — Observation Stay (HOSPITAL_COMMUNITY): Payer: Medicare HMO | Admitting: Anesthesiology

## 2021-03-18 ENCOUNTER — Observation Stay (HOSPITAL_BASED_OUTPATIENT_CLINIC_OR_DEPARTMENT_OTHER): Payer: Medicare HMO | Admitting: Anesthesiology

## 2021-03-18 ENCOUNTER — Encounter (HOSPITAL_COMMUNITY)
Admission: EM | Disposition: A | Discharge status: Home / Self Care | Payer: Self-pay | Source: Home / Self Care | Attending: Internal Medicine

## 2021-03-18 ENCOUNTER — Encounter (HOSPITAL_COMMUNITY): Payer: Self-pay | Admitting: Internal Medicine

## 2021-03-18 DIAGNOSIS — K921 Melena: Secondary | ICD-10-CM | POA: Diagnosis not present

## 2021-03-18 DIAGNOSIS — K635 Polyp of colon: Secondary | ICD-10-CM | POA: Diagnosis not present

## 2021-03-18 DIAGNOSIS — R933 Abnormal findings on diagnostic imaging of other parts of digestive tract: Secondary | ICD-10-CM | POA: Diagnosis not present

## 2021-03-18 DIAGNOSIS — D649 Anemia, unspecified: Secondary | ICD-10-CM | POA: Diagnosis not present

## 2021-03-18 DIAGNOSIS — K5730 Diverticulosis of large intestine without perforation or abscess without bleeding: Secondary | ICD-10-CM

## 2021-03-18 DIAGNOSIS — K222 Esophageal obstruction: Secondary | ICD-10-CM | POA: Diagnosis not present

## 2021-03-18 DIAGNOSIS — K2970 Gastritis, unspecified, without bleeding: Secondary | ICD-10-CM | POA: Diagnosis not present

## 2021-03-18 DIAGNOSIS — K648 Other hemorrhoids: Secondary | ICD-10-CM

## 2021-03-18 DIAGNOSIS — K2991 Gastroduodenitis, unspecified, with bleeding: Secondary | ICD-10-CM | POA: Diagnosis not present

## 2021-03-18 DIAGNOSIS — N1832 Chronic kidney disease, stage 3b: Secondary | ICD-10-CM | POA: Diagnosis not present

## 2021-03-18 DIAGNOSIS — K2980 Duodenitis without bleeding: Secondary | ICD-10-CM

## 2021-03-18 DIAGNOSIS — F17210 Nicotine dependence, cigarettes, uncomplicated: Secondary | ICD-10-CM | POA: Diagnosis not present

## 2021-03-18 DIAGNOSIS — K922 Gastrointestinal hemorrhage, unspecified: Secondary | ICD-10-CM | POA: Diagnosis not present

## 2021-03-18 HISTORY — PX: ESOPHAGOGASTRODUODENOSCOPY (EGD) WITH PROPOFOL: SHX5813

## 2021-03-18 HISTORY — PX: COLONOSCOPY WITH PROPOFOL: SHX5780

## 2021-03-18 HISTORY — PX: BIOPSY: SHX5522

## 2021-03-18 LAB — CBC
HCT: 32.9 % — ABNORMAL LOW (ref 36.0–46.0)
Hemoglobin: 10.4 g/dL — ABNORMAL LOW (ref 12.0–15.0)
MCH: 28.7 pg (ref 26.0–34.0)
MCHC: 31.6 g/dL (ref 30.0–36.0)
MCV: 90.9 fL (ref 80.0–100.0)
Platelets: 344 10*3/uL (ref 150–400)
RBC: 3.62 MIL/uL — ABNORMAL LOW (ref 3.87–5.11)
RDW: 17.8 % — ABNORMAL HIGH (ref 11.5–15.5)
WBC: 11.6 10*3/uL — ABNORMAL HIGH (ref 4.0–10.5)
nRBC: 0 % (ref 0.0–0.2)

## 2021-03-18 LAB — BASIC METABOLIC PANEL
Anion gap: 5 (ref 5–15)
BUN: 22 mg/dL (ref 8–23)
CO2: 29 mmol/L (ref 22–32)
Calcium: 8.2 mg/dL — ABNORMAL LOW (ref 8.9–10.3)
Chloride: 103 mmol/L (ref 98–111)
Creatinine, Ser: 1.26 mg/dL — ABNORMAL HIGH (ref 0.44–1.00)
GFR, Estimated: 46 mL/min — ABNORMAL LOW (ref >60)
Glucose, Bld: 91 mg/dL (ref 70–99)
Potassium: 4 mmol/L (ref 3.5–5.1)
Sodium: 137 mmol/L (ref 135–145)

## 2021-03-18 LAB — HIV ANTIBODY (ROUTINE TESTING W REFLEX): HIV Screen 4th Generation wRfx: NONREACTIVE

## 2021-03-18 SURGERY — COLONOSCOPY WITH PROPOFOL
Anesthesia: General

## 2021-03-18 MED ORDER — LIDOCAINE 2% (20 MG/ML) 5 ML SYRINGE
INTRAMUSCULAR | Status: DC | PRN
  Administered 2021-03-18: 50 mg via INTRAVENOUS

## 2021-03-18 MED ORDER — PROPOFOL 10 MG/ML IV BOLUS
INTRAVENOUS | Status: DC | PRN
  Administered 2021-03-18: 80 mg via INTRAVENOUS

## 2021-03-18 MED ORDER — LACTATED RINGERS IV SOLN: INTRAVENOUS | Status: DC

## 2021-03-18 MED ORDER — PROPOFOL 500 MG/50ML IV EMUL
INTRAVENOUS | Status: DC | PRN
  Administered 2021-03-18: 200 ug/kg/min via INTRAVENOUS

## 2021-03-18 MED ORDER — SODIUM CHLORIDE 0.9 % IV SOLN: INTRAVENOUS | Status: DC

## 2021-03-18 NOTE — Progress Notes (Signed)
Rhonda Briggs has had several bloody episodes from rectum. Rhonda Briggs stated she has not had stool when going to the bathroom since last night just bloody now. Rhonda Briggs did not receive enema this morning before on-coming shift. Seward Speck NP aware. Per Cyndi Bender NP give Rhonda Briggs enema. Rhonda Briggs received enema noted bright red bloody output no clots. Cyndi Bender NP made aware. No new orders.

## 2021-03-18 NOTE — Assessment & Plan Note (Signed)
-  In the setting of acute GI bleed and underlying anemia of chronic kidney disease -Continue to follow hemoglobin trend -Further transfusion as needed.

## 2021-03-18 NOTE — Op Note (Signed)
[facility] Patient Name: Rhonda Briggs Procedure Date: 03/18/2021 2:05 PM MRN: [id number] Date of Birth: 01/27/1950 Attending MD: [name] ,  CSN: [id number] Age: 71 Admit Type: Outpatient Procedure:                Colonoscopy Indications:              Hematochezia Providers:                [name], Hughie Closs RN, RN, Aram Candela Referring MD:              Medicines:                Monitored Anesthesia Care Complications:            No immediate complications. Estimated Blood Loss:     Estimated blood loss: none. Procedure:                Pre-Anesthesia Assessment:                           - Prior to the procedure, a History and Physical                            was performed, and patient medications, allergies                            and sensitivities were reviewed. The patient's                            tolerance of previous anesthesia was reviewed.                           - The risks and benefits of the procedure and the                            sedation options and risks were discussed with the                            patient. All questions were answered and informed                            consent was obtained.                           - ASA Grade Assessment: III - A patient with severe                            systemic disease.                           After obtaining informed consent, the colonoscope                            was passed under direct vision. Throughout the                            procedure, the patient's blood pressure, pulse, and  oxygen saturations were monitored continuously. The                            [id number]-HQ190L ([id number] scope was introduced through                            the anus and advanced to the the terminal ileum.                            The colonoscopy was performed without difficulty.                            The patient tolerated the procedure well. The                             quality of the bowel preparation was adequate to                            identify polyps 6 mm and larger in size. Scope In: 2:43:15 PM Scope Out: 3:16:36 PM Scope Withdrawal Time: 0 hours 24 minutes 45 seconds  Total Procedure Duration: 0 hours 33 minutes 21 seconds  Findings:      The terminal ileum contained red blood. No lesions could be seen 5 cm       proximal to IC valve.      Red blood was found in the entire colon.      A 4 mm polyp was found in the ascending colon. The polyp was sessile.       This was not removed as there was significant amount of blood which made       the resection not possible      Multiple small and large-mouthed diverticula were found in the sigmoid       colon, descending colon and ascending colon. These were thoroughly       washed but no over bleeding could be seen or stigmata of recent bleeding.      Non-bleeding internal hemorrhoids were found during retroflexion. The       hemorrhoids were small. Impression:               - Blood in the terminal ileum.                           - Blood in the entire examined colon.                           - One 4 mm polyp in the ascending colon.                           - Diverticulosis in the sigmoid colon, in the                            descending colon and in the ascending colon.                           - Non-bleeding internal hemorrhoids.                           -  No specimens collected. Moderate Sedation:      Per Anesthesia Care Recommendation:           - Repeat colonoscopy within 3 months for screening                            purposes.                           - Return patient to hospital ward for ongoing care.                           - Clear liquid diet. Procedure Code(s):        --- Professional ---                           [phone], Colonoscopy, flexible; diagnostic, including                            collection of specimen(s) by brushing or washing,                             when performed (separate procedure) Diagnosis Code(s):        --- Professional ---                           K92.2, Gastrointestinal hemorrhage, unspecified                           K63.5, Polyp of colon                           K64.8, Other hemorrhoids                           K92.1, Melena (includes Hematochezia)                           K57.30, Diverticulosis of large intestine without                            perforation or abscess without bleeding CPT copyright 2019 [employer]. All rights reserved. The codes documented in this report are preliminary and upon coder review may  be revised to meet current compliance requirements. [name], MD [name],  03/18/2021 3:38:16 PM This report has been signed electronically. Number of Addenda: 0

## 2021-03-18 NOTE — Op Note (Addendum)
[facility] Patient Name: Rhonda Briggs Procedure Date: 03/18/2021 2:04 PM MRN: [id number] Date of Birth: [birth year] Attending MD: [name] ,  CSN: [id number] Age: 71 Admit Type: Inpatient Procedure:                Upper GI endoscopy Indications:              Hematochezia, Abnormal CT of the GI tract Providers:                [name], Hughie Closs RN, RN, Aram Candela Referring MD:              Medicines:                Monitored Anesthesia Care Complications:            No immediate complications. Estimated Blood Loss:     Estimated blood loss: none. Procedure:                Pre-Anesthesia Assessment:                           - Prior to the procedure, a History and Physical                            was performed, and patient medications, allergies                            and sensitivities were reviewed. The patient's                            tolerance of previous anesthesia was reviewed.                           - The risks and benefits of the procedure and the                            sedation options and risks were discussed with the                            patient. All questions were answered and informed                            consent was obtained.                           - ASA Grade Assessment: III - A patient with severe                            systemic disease.                           After obtaining informed consent, the endoscope was                            passed under direct vision. Throughout the  procedure, the patient's blood pressure, pulse, and                            oxygen saturations were monitored continuously. The                            [id number]-H190 ([id number]) scope was introduced through the                            mouth, and advanced to the second part of duodenum.                            The upper GI endoscopy was accomplished without                            difficulty. The  patient tolerated the procedure                            well. Scope In: 2:28:09 PM Scope Out: 2:38:50 PM Total Procedure Duration: 0 hours 10 minutes 41 seconds  Findings:      A non-obstructing Schatzki ring was found at the gastroesophageal       junction. This was disrupted with a cold forceps.      Localized mild inflammation characterized by erythema was found in the       gastric antrum. Biopsies were taken with a cold forceps for Helicobacter       pylori testing.      Diffuse inflammation characterized by congestion (edema), erosions and       erythema was found in the duodenal bulb and in the first portion of the       duodenum. Upon careful inspection, no presence of hematin or active       bleeding was present. Impression:               - Non-obstructing Schatzki ring.                           - Gastritis. Biopsied.                           - Duodenitis. Moderate Sedation:      Per Anesthesia Care Recommendation:           - Return patient to hospital ward for ongoing care.                           - Clear liquid diet today, NPO after MN.                           - Continue pantoprazole 40 mg IV twice a day.                           - Await pathology results.                           - Perform inpatient capsule endoscopy tomorrow if  further ongoing clinical bleeding or drop in                            hemoglobin. Procedure Code(s):        --- Professional ---                           [phone], Esophagogastroduodenoscopy, flexible,                            transoral; with biopsy, single or multiple Diagnosis Code(s):        --- Professional ---                           K22.2, Esophageal obstruction                           K29.70, Gastritis, unspecified, without bleeding                           K29.80, Duodenitis without bleeding                           K92.1, Melena (includes Hematochezia)                           R93.3,  Abnormal findings on diagnostic imaging of                            other parts of digestive tract CPT copyright 2019 [employer]. All rights reserved. The codes documented in this report are preliminary and upon coder review may  be revised to meet current compliance requirements. [name], MD [name],  03/18/2021 3:21:03 PM This report has been signed electronically. Number of Addenda: 0

## 2021-03-18 NOTE — Brief Op Note (Signed)
03/17/2021 - 03/18/2021  2:40 PM  PATIENT:  Rhonda Briggs  71 y.o. female  PRE-OPERATIVE DIAGNOSIS:  lower GI bleed  POST-OPERATIVE DIAGNOSIS:  hiatal hernia; Gastritis; Duodnitis; duodenal erosions; Gastric erosions;  PROCEDURE:  Procedure(s): COLONOSCOPY WITH PROPOFOL (N/A) ESOPHAGOGASTRODUODENOSCOPY (EGD) WITH PROPOFOL (N/A) BIOPSY  SURGEON:  Surgeon(s) and Role:    * Harvel Quale, MD - Primary  Patient underwent EGD and colonoscopy under propofol sedation.  Tolerated the procedure adequately. EGD showed A non-obstructing Schatzki ring was found at the gastroesophageal junction. This was disrupted with a cold forceps. Localized mild inflammation characterized by erythema was found in the gastric antrum.  Biopsies were taken with a cold forceps for Helicobacter pylori testing.  Diffuse inflammation characterized by congestion (edema), erosions and erythema was found in the duodenal bulb and in the first portion of the duodenum.  Upon careful inspection, no presence of hematin or active bleeding was present.  Colonoscopy showed The terminal ileum contained red blood. No lesions could be seen 5 cm proximal to IC valve. Red blood was found in the entire colon. A 4 mm polyp was found in the ascending colon.  The polyp was sessile. This was not removed as there was significant amount of blood which made the resection not possible. Multiple small and large-mouthed diverticula were found in the sigmoid colon, descending colon and ascending colon. These were thoroughly washed but no over bleeding could be seen or stigmata of recent bleeding. Non-bleeding internal hemorrhoids were found during retroflexion.  The hemorrhoids were small.   RECOMMENDATIONS - Return patient to hospital ward for ongoing care.  - Clear liquid diet today, NPO after MN. - Continue pantoprazole 40 mg IV twice a day. - Await pathology results.  - Perform inpatient capsule endoscopy tomorrow if further ongoing  clinical bleeding or drop in hemoglobin. - Repeat colonoscopy within 3 months for screening purposes.   [name], MD Gastroenterology and Hepatology [facility]

## 2021-03-18 NOTE — Anesthesia Preprocedure Evaluation (Addendum)
Anesthesia Evaluation  Patient identified by MRN, date of birth, ID band Patient awake    Reviewed: Allergy & Precautions, NPO status , Patient's Chart, lab work & pertinent test results  History of Anesthesia Complications (+) PONV and history of anesthetic complications  Airway Mallampati: II  TM Distance: >3 FB     Dental  (+) Dental Advisory Given, Missing, Chipped   Pulmonary shortness of breath and with exertion, COPD,  COPD inhaler, Current Smoker and Patient abstained from smoking.,    Pulmonary exam normal breath sounds clear to auscultation       Cardiovascular hypertension, Pt. on medications Normal cardiovascular exam Rhythm:Regular Rate:Normal  1. Left ventricular ejection fraction, by estimation, is 70 to 75%. The left ventricle has hyperdynamic function. The left ventricle has no regional wall motion abnormalities. There is mild asymmetric left ventricular hypertrophy of the basal segment.  Left ventricular diastolic parameters are indeterminate.  2. Right ventricular systolic function is low normal. The right ventricular size is normal. Tricuspid regurgitation signal is inadequate for assessing PA pressure.  3. The mitral valve is grossly normal. Trivial mitral valve  regurgitation.  4. The aortic valve is tricuspid. There is mild calcification of the aortic valve with fixed left coronary cusp but no obvious stenosis. Aortic valve regurgitation is not visualized. Aortic valve mean gradient measures 9.0 mmHg.  5. The inferior vena cava is normal in size with greater than 50% respiratory variability, suggesting right atrial pressure of 3 mmHg.    Neuro/Psych  Neuromuscular disease (RLS) negative psych ROS   GI/Hepatic negative GI ROS, Neg liver ROS,   Endo/Other  negative endocrine ROS  Renal/GU Renal InsufficiencyRenal disease  negative genitourinary   Musculoskeletal  (+) Arthritis , Osteoarthritis and  Rheumatoid disorders,    Abdominal   Peds negative pediatric ROS (+)  Hematology  (+) Blood dyscrasia, anemia ,   Anesthesia Other Findings   Reproductive/Obstetrics negative OB ROS                            Anesthesia Physical Anesthesia Plan  ASA: 3  Anesthesia Plan: General   Post-op Pain Management: Minimal or no pain anticipated   Induction: Intravenous  PONV Risk Score and Plan: TIVA  Airway Management Planned: Nasal Cannula and Natural Airway  Additional Equipment:   Intra-op Plan:   Post-operative Plan:   Informed Consent: I have reviewed the patients History and Physical, chart, labs and discussed the procedure including the risks, benefits and alternatives for the proposed anesthesia with the patient or authorized representative who has indicated his/her understanding and acceptance.     Dental advisory given  Plan Discussed with: CRNA and Surgeon  Anesthesia Plan Comments:         Anesthesia Quick Evaluation

## 2021-03-18 NOTE — Progress Notes (Signed)
Progress Note   Patient: Rhonda Briggs WGN:[id number] DOB: [birth year] DOA: 03/17/2021     0 DOS: the patient was seen and examined on 03/18/2021   Brief hospital admission narrative: Jesica Goheen is a 71 y.o. female with medical history significant of tobacco abuse, anemia of chronic kidney disease, chronic kidney disease a stage IIIb, hypertension, hyperlipidemia, osteoporosis and arthritis; presented to the emergency department secondary to abdominal pain and lower GI bleed.  Patient reports waking up due to abdominal discomfort followed by dark red clots and bright red blood diarrhea.  Patient reports abdominal discomfort improved/resolved after bowel movement.  Rectal bleeding appreciated in the ED. Patient expressed associated lightheadedness, dizziness and mild palpitations.  In the ED blood pressure found to be soft.  She responded to fluid resuscitation.   Negative COVID PCR; hemoglobin 11.5 on arrival with subsequent drop to 9.4 almost 3 hours later.   Patient reported family history of colon cancer in her dad and has never had any screening work-up.   Of note; she has had recent diagnosis of shingles in her temporal region for what she has been using NSAIDs products and Tylenol to assist with pain.   Assessment and Plan: * Lower GI bleed- (present on admission) - Patient presenting with abdominal pain follow by GI bleed suggesting ischemic colitis, other considerations could be malignancy, polyps or diverticulosis.  -Status post EGD and colonoscopy; will follow results and further recommendations by GI service. -1 unit of PRBC was transfused at time of admission -Hemoglobin 10.4  -Patient continue experiencing lower GI bleed episodes throughout the night.   -Hemodynamically stable currently and denying lightheadedness, dizziness or shortness of breath.   -Continue PPI.  Chronic kidney disease, stage III (moderate) (HCC)- (present on admission) -Chronic stage IIIb renal failure;  appears to be associated with chronic hypertension. -No recent creatinine for comparison of stability -Based on GFR patient stage IIIb status. -Continue to minimize nephrotoxic agents, maintain adequate hydration and avoid the use of contrast/avoid hypotension. -Repeat basic metabolic panel to follow renal function trend.  Essential hypertension- (present on admission) -Soft blood pressure at time of admission -Blood pressure slightly rising currently; still for control. -Continue holding antihypertensive agents. -Follow vital signs.   Hyperlipidemia- (present on admission) -Continue statins.  Cigarette smoker- (present on admission) -Cessation counseling has been provided -Nicotine patch has been ordered.  Restless leg syndrome- (present on admission) -Continue the use of Mirapex  Obesity, Class I, BMI 30-34.9- (present on admission) -Body mass index is 33.31 kg/m. -Low calorie diet, portion control and increase physical activity discussed with patient.  History of osteoarthritis -Status post bilateral knee replacement -Continue as needed analgesics -Minimize/avoid the use of NSAIDs. -Patient reports pain to be overall stable.  COPD  GOLD II- (present on admission) -No shortness of breath or wheezing -Continue to use bronchodilators if needed. -Smoking cessation provided.  Anemia -In the setting of acute GI bleed and underlying anemia of chronic kidney disease -Continue to follow hemoglobin trend -Further transfusion as needed.     Subjective:  Afebrile, no chest pain, no palpitations, no abdominal pain.  Patient reports to be hungry and expressed no shortness of breath.  No longer experiencing dizziness or lightheadedness.  Overnight with significant episodes of bright red blood per rectum.  Physical Exam: Vitals:   03/18/21 1213 03/18/21 1320 03/18/21 1523 03/18/21 1625  BP: (!) 141/54 (!) 151/43 121/71 (!) 126/49  Pulse: 81 96 (!) 104   Resp: 18 (!) 23 16 20    Temp: 98.4  F (36.9 C) 97.8 F (36.6 C) (!) 97.5 F (36.4 C)   TempSrc: Oral Oral    SpO2: 98% 97% 100% 100%  Weight:  74.4 kg    Height:  4\' 11"  (1.499 m)     General exam: Alert, awake, oriented x 3; reports no chest pain, no palpitations, no shortness of breath, no dizziness or lightheadedness.  Positive episodes of bright red blood overnight. Respiratory system: Clear to auscultation. Respiratory effort normal.  No using accessory muscle.  Good saturation on room air. Cardiovascular system:RRR. No murmurs, rubs, gallops.  No JVD. Gastrointestinal system: Abdomen is nondistended, soft and nontender. No organomegaly or masses felt. Normal bowel sounds heard. Central nervous system: Alert and oriented. No focal neurological deficits. Extremities: No cyanosis, clubbing or edema. Skin: No petechiae. Psychiatry: Judgement and insight appear normal. Mood & affect appropriate.    Data Reviewed: Hemoglobin 10.4; WBCs 11.6 HIV screening test non-reactive Basic metabolic panel: Demonstrating creatinine of 1.26; normal sodium and potassium level  Family Communication: No family at bedside.  Patient was updated by herself.  Disposition: Status is: Observation The patient remains OBS appropriate and will d/c before 2 midnights.  Planned Discharge Destination: Home  Author: Barton Dubois, MD 03/18/2021 4:42 PM  For on call review [email or web address].

## 2021-03-18 NOTE — Progress Notes (Signed)
Patient had bowel movement with small amount of formed stool and moderate bleeding noted. Will notify MD of persisting bloody stools.

## 2021-03-18 NOTE — Progress Notes (Signed)
Patient went to [facility] noted blood to bed, floor and BSC. Nurse reported last night patient had several bloody episodes when going to the bathroom. MD Dyann Kief made aware.

## 2021-03-18 NOTE — Transfer of Care (Signed)
Immediate Anesthesia Transfer of Care Note  Patient: Rhonda Briggs  Procedure(s) Performed: COLONOSCOPY WITH PROPOFOL ESOPHAGOGASTRODUODENOSCOPY (EGD) WITH PROPOFOL BIOPSY  Patient Location: PACU  Anesthesia Type:MAC  Level of Consciousness: sedated, patient cooperative and responds to stimulation  Airway & Oxygen Therapy: Patient Spontanous Breathing  Post-op Assessment: Report given to RN, Post -op Vital signs reviewed and stable and Patient moving all extremities  Post vital signs: Reviewed and stable  Last Vitals:  Vitals Value Taken Time  BP    Temp    Pulse    Resp 18 03/18/21 1523  SpO2    Vitals shown include unvalidated device data.  Last Pain:  Vitals:   03/18/21 1320  TempSrc: Oral  PainSc:          Complications: No notable events documented.

## 2021-03-18 NOTE — Anesthesia Postprocedure Evaluation (Signed)
Anesthesia Post Note  Patient: Rhonda Briggs  Procedure(s) Performed: COLONOSCOPY WITH PROPOFOL ESOPHAGOGASTRODUODENOSCOPY (EGD) WITH PROPOFOL BIOPSY  Patient location during evaluation: PACU Anesthesia Type: General Level of consciousness: awake and alert and oriented Pain management: pain level controlled Vital Signs Assessment: post-procedure vital signs reviewed and stable Respiratory status: spontaneous breathing, nonlabored ventilation and respiratory function stable Cardiovascular status: blood pressure returned to baseline and stable Postop Assessment: no apparent nausea or vomiting Anesthetic complications: no   No notable events documented.   Last Vitals:  Vitals:   03/18/21 1320 03/18/21 1523  BP: (!) 151/43 121/71  Pulse: 96 (!) 104  Resp: (!) 23 16  Temp: 36.6 C (!) 36.4 C  SpO2: 97% 100%    Last Pain:  Vitals:   03/18/21 1523  TempSrc:   PainSc: 0-No pain                 Aerin Delany C Abdulhadi Stopa

## 2021-03-18 NOTE — Plan of Care (Signed)
°  Problem: Education: Goal: Knowledge of General Education information will improve Description: Including pain rating scale, medication(s)/side effects and non-pharmacologic comfort measures 03/18/2021 2033 by Cyndra Numbers, RN Outcome: Progressing 03/18/2021 2030 by Cyndra Numbers, RN Outcome: Progressing   Problem: Health Behavior/Discharge Planning: Goal: Ability to manage health-related needs will improve 03/18/2021 2033 by Cyndra Numbers, RN Outcome: Progressing 03/18/2021 2030 by Cyndra Numbers, RN Outcome: Progressing   Problem: Clinical Measurements: Goal: Ability to maintain clinical measurements within normal limits will improve 03/18/2021 2033 by Cyndra Numbers, RN Outcome: Progressing 03/18/2021 2030 by Cyndra Numbers, RN Outcome: Progressing Goal: Will remain free from infection 03/18/2021 2033 by Cyndra Numbers, RN Outcome: Progressing 03/18/2021 2030 by Cyndra Numbers, RN Outcome: Progressing Goal: Diagnostic test results will improve 03/18/2021 2033 by Cyndra Numbers, RN Outcome: Progressing 03/18/2021 2030 by Cyndra Numbers, RN Outcome: Progressing Goal: Respiratory complications will improve 03/18/2021 2033 by Cyndra Numbers, RN Outcome: Progressing 03/18/2021 2030 by Cyndra Numbers, RN Outcome: Progressing Goal: Cardiovascular complication will be avoided 03/18/2021 2033 by Cyndra Numbers, RN Outcome: Progressing 03/18/2021 2030 by Cyndra Numbers, RN Outcome: Progressing   Problem: Activity: Goal: Risk for activity intolerance will decrease 03/18/2021 2033 by Cyndra Numbers, RN Outcome: Progressing 03/18/2021 2030 by Cyndra Numbers, RN Outcome: Progressing   Problem: Nutrition: Goal: Adequate nutrition will be maintained 03/18/2021 2033 by Cyndra Numbers, RN Outcome: Progressing 03/18/2021 2030 by Cyndra Numbers, RN Outcome: Progressing   Problem: Coping: Goal: Level of anxiety will decrease 03/18/2021 2033 by Cyndra Numbers, RN Outcome: Progressing 03/18/2021 2030 by Cyndra Numbers, RN Outcome: Progressing   Problem:  Elimination: Goal: Will not experience complications related to bowel motility 03/18/2021 2033 by Cyndra Numbers, RN Outcome: Progressing 03/18/2021 2030 by Cyndra Numbers, RN Outcome: Progressing Goal: Will not experience complications related to urinary retention 03/18/2021 2033 by Cyndra Numbers, RN Outcome: Progressing 03/18/2021 2030 by Cyndra Numbers, RN Outcome: Progressing   Problem: Pain Managment: Goal: General experience of comfort will improve 03/18/2021 2033 by Cyndra Numbers, RN Outcome: Progressing 03/18/2021 2030 by Cyndra Numbers, RN Outcome: Progressing   Problem: Safety: Goal: Ability to remain free from injury will improve 03/18/2021 2033 by Cyndra Numbers, RN Outcome: Progressing 03/18/2021 2030 by Cyndra Numbers, RN Outcome: Progressing   Problem: Skin Integrity: Goal: Risk for impaired skin integrity will decrease 03/18/2021 2033 by Cyndra Numbers, RN Outcome: Progressing 03/18/2021 2030 by Cyndra Numbers, RN Outcome: Progressing   Problem: Education: Goal: Ability to identify signs and symptoms of gastrointestinal bleeding will improve Outcome: Progressing   Problem: Bowel/Gastric: Goal: Will show no signs and symptoms of gastrointestinal bleeding Outcome: Progressing   Problem: Fluid Volume: Goal: Will show no signs and symptoms of excessive bleeding Outcome: Progressing   Problem: Clinical Measurements: Goal: Complications related to the disease process, condition or treatment will be avoided or minimized Outcome: Progressing

## 2021-03-18 NOTE — Care Management Obs Status (Signed)
[facility] NOTIFICATION   Patient Details  Name: Rhonda Briggs MRN: [id number] Date of Birth: Dec 02, 1950   Medicare Observation Status Notification Given:  Yes    [name] 03/18/2021, 4:18 PM

## 2021-03-18 NOTE — Progress Notes (Signed)
We will proceed with EGD and colonoscopy as scheduled.  I thoroughly discussed with the patient his procedure, including the risks involved. Patient understands what the procedure involves including the benefits and any risks. Patient understands alternatives to the proposed procedure. Risks including (but not limited to) bleeding, tearing of the lining (perforation), rupture of adjacent organs, problems with heart and lung function, infection, and medication reactions. A small percentage of complications may require surgery, hospitalization, repeat endoscopic procedure, and/or transfusion.  Patient understood and agreed. ? ?Rhonda Oyer Castaneda, MD ?Gastroenterology and Hepatology ?[facility] ? ?

## 2021-03-18 NOTE — TOC Progression Note (Signed)
°  Transition of Care [facility]) Screening Note   Patient Details  Name: Rhonda Briggs Date of Birth: January 23, 1951   Transition of Care [facility]) CM/SW Contact:    Shade Flood, LCSW Phone Number: 03/18/2021, 9:09 AM    Transition of Care Department [facility]) has reviewed patient and no TOC needs have been identified at this time. We will continue to monitor patient advancement through interdisciplinary progression rounds. If new patient transition needs arise, please place a TOC consult.

## 2021-03-19 ENCOUNTER — Inpatient Hospital Stay (HOSPITAL_COMMUNITY): Payer: Medicare HMO

## 2021-03-19 ENCOUNTER — Encounter (HOSPITAL_COMMUNITY)
Admission: EM | Disposition: A | Discharge status: Home / Self Care | Payer: Self-pay | Source: Home / Self Care | Attending: Internal Medicine

## 2021-03-19 DIAGNOSIS — D62 Acute posthemorrhagic anemia: Secondary | ICD-10-CM | POA: Diagnosis present

## 2021-03-19 DIAGNOSIS — K921 Melena: Secondary | ICD-10-CM | POA: Diagnosis present

## 2021-03-19 DIAGNOSIS — D649 Anemia, unspecified: Secondary | ICD-10-CM | POA: Diagnosis not present

## 2021-03-19 DIAGNOSIS — G2581 Restless legs syndrome: Secondary | ICD-10-CM | POA: Diagnosis present

## 2021-03-19 DIAGNOSIS — Z9889 Other specified postprocedural states: Secondary | ICD-10-CM

## 2021-03-19 DIAGNOSIS — K922 Gastrointestinal hemorrhage, unspecified: Secondary | ICD-10-CM | POA: Diagnosis not present

## 2021-03-19 DIAGNOSIS — Z6833 Body mass index (BMI) 33.0-33.9, adult: Secondary | ICD-10-CM | POA: Diagnosis not present

## 2021-03-19 DIAGNOSIS — Z79899 Other long term (current) drug therapy: Secondary | ICD-10-CM | POA: Diagnosis not present

## 2021-03-19 DIAGNOSIS — K222 Esophageal obstruction: Secondary | ICD-10-CM | POA: Diagnosis present

## 2021-03-19 DIAGNOSIS — M810 Age-related osteoporosis without current pathological fracture: Secondary | ICD-10-CM | POA: Diagnosis present

## 2021-03-19 DIAGNOSIS — K635 Polyp of colon: Secondary | ICD-10-CM | POA: Diagnosis present

## 2021-03-19 DIAGNOSIS — K5731 Diverticulosis of large intestine without perforation or abscess with bleeding: Secondary | ICD-10-CM | POA: Diagnosis present

## 2021-03-19 DIAGNOSIS — Z880 Allergy status to penicillin: Secondary | ICD-10-CM | POA: Diagnosis not present

## 2021-03-19 DIAGNOSIS — K2970 Gastritis, unspecified, without bleeding: Secondary | ICD-10-CM | POA: Diagnosis present

## 2021-03-19 DIAGNOSIS — D500 Iron deficiency anemia secondary to blood loss (chronic): Secondary | ICD-10-CM | POA: Diagnosis not present

## 2021-03-19 DIAGNOSIS — N1832 Chronic kidney disease, stage 3b: Secondary | ICD-10-CM | POA: Diagnosis present

## 2021-03-19 DIAGNOSIS — K2980 Duodenitis without bleeding: Secondary | ICD-10-CM | POA: Diagnosis present

## 2021-03-19 DIAGNOSIS — K559 Vascular disorder of intestine, unspecified: Secondary | ICD-10-CM | POA: Diagnosis present

## 2021-03-19 DIAGNOSIS — Z20822 Contact with and (suspected) exposure to covid-19: Secondary | ICD-10-CM | POA: Diagnosis present

## 2021-03-19 DIAGNOSIS — Z8249 Family history of ischemic heart disease and other diseases of the circulatory system: Secondary | ICD-10-CM | POA: Diagnosis not present

## 2021-03-19 DIAGNOSIS — K2990 Gastroduodenitis, unspecified, without bleeding: Secondary | ICD-10-CM

## 2021-03-19 DIAGNOSIS — I129 Hypertensive chronic kidney disease with stage 1 through stage 4 chronic kidney disease, or unspecified chronic kidney disease: Secondary | ICD-10-CM | POA: Diagnosis present

## 2021-03-19 DIAGNOSIS — F17210 Nicotine dependence, cigarettes, uncomplicated: Secondary | ICD-10-CM | POA: Diagnosis present

## 2021-03-19 DIAGNOSIS — D631 Anemia in chronic kidney disease: Secondary | ICD-10-CM | POA: Diagnosis present

## 2021-03-19 DIAGNOSIS — E785 Hyperlipidemia, unspecified: Secondary | ICD-10-CM | POA: Diagnosis present

## 2021-03-19 DIAGNOSIS — E669 Obesity, unspecified: Secondary | ICD-10-CM | POA: Diagnosis present

## 2021-03-19 DIAGNOSIS — I9589 Other hypotension: Secondary | ICD-10-CM | POA: Diagnosis present

## 2021-03-19 DIAGNOSIS — Z96653 Presence of artificial knee joint, bilateral: Secondary | ICD-10-CM | POA: Diagnosis present

## 2021-03-19 DIAGNOSIS — Z800 Family history of malignant neoplasm of digestive organs: Secondary | ICD-10-CM | POA: Diagnosis not present

## 2021-03-19 DIAGNOSIS — J449 Chronic obstructive pulmonary disease, unspecified: Secondary | ICD-10-CM | POA: Diagnosis present

## 2021-03-19 HISTORY — PX: GIVENS CAPSULE STUDY: SHX5432

## 2021-03-19 LAB — PREPARE RBC (CROSSMATCH)

## 2021-03-19 LAB — HEMOGLOBIN AND HEMATOCRIT, BLOOD
HCT: 26.6 % — ABNORMAL LOW (ref 36.0–46.0)
HCT: 24.4 % — ABNORMAL LOW (ref 36.0–46.0)
Hemoglobin: 8.2 g/dL — ABNORMAL LOW (ref 12.0–15.0)
Hemoglobin: 7.7 g/dL — ABNORMAL LOW (ref 12.0–15.0)

## 2021-03-19 SURGERY — IMAGING PROCEDURE, GI TRACT, INTRALUMINAL, VIA CAPSULE

## 2021-03-19 MED ORDER — SODIUM CHLORIDE 0.9% IV SOLUTION: Freq: Once | INTRAVENOUS | Status: AC

## 2021-03-19 MED ORDER — FUROSEMIDE 10 MG/ML IJ SOLN
20.0000 mg | Freq: Once | INTRAMUSCULAR | Status: AC
  Administered 2021-03-19: 20 mg via INTRAVENOUS
  Filled 2021-03-19: qty 2

## 2021-03-19 MED ORDER — TECHNETIUM TC 99M-LABELED RED BLOOD CELLS IV KIT
25.0000 | PACK | Freq: Once | INTRAVENOUS | Status: AC | PRN
  Administered 2021-03-19: 24 via INTRAVENOUS

## 2021-03-19 MED ORDER — HEPARIN SOD (PORK) LOCK FLUSH 100 UNIT/ML IV SOLN
INTRAVENOUS | Status: AC
  Filled 2021-03-19: qty 5

## 2021-03-19 NOTE — Progress Notes (Signed)
Patient had several episodes of bleeding of moderate amount from rectum, nausea and vomitted small amount of clear liquid, PRN zofran given with effectiveness noted within the hour.

## 2021-03-19 NOTE — Progress Notes (Signed)
Progress Note   Patient: Rhonda Briggs ZOX:[id number] DOB: 1950-03-11 DOA: 03/17/2021     0 DOS: the patient was seen and examined on 03/19/2021   Brief hospital admission narrative: Rhonda Briggs is a 71 y.o. female with medical history significant of tobacco abuse, anemia of chronic kidney disease, chronic kidney disease a stage IIIb, hypertension, hyperlipidemia, osteoporosis and arthritis; presented to the emergency department secondary to abdominal pain and lower GI bleed.  Patient reports waking up due to abdominal discomfort followed by dark red clots and bright red blood diarrhea.  Patient reports abdominal discomfort improved/resolved after bowel movement.  Rectal bleeding appreciated in the ED. Patient expressed associated lightheadedness, dizziness and mild palpitations.  In the ED blood pressure found to be soft.  She responded to fluid resuscitation.   Negative COVID PCR; hemoglobin 11.5 on arrival with subsequent drop to 9.4 almost 3 hours later.   Patient reported family history of colon cancer in her dad and has never had any screening work-up.   Of note; she has had recent diagnosis of shingles in her temporal region for what she has been using NSAIDs products and Tylenol to assist with pain.   Assessment and Plan: -GI bleed- (present on admission) - Patient presenting with abdominal pain follow by GI bleed suggesting ischemic colitis, other considerations could be malignancy, polyps or diverticulosis.  -Status post EGD and colonoscopy;  -1 unit of PRBC was transfused at time of admission -Continues to have episodes of rectal bleeding associated with fatigue, weakness, dizziness and some dyspnea on exertion -Capsule endoscopy on 03/19/2021 suggest possible ongoing/acute GI bleed -RBC tagged study/bleeding scan ordered for 03/19/2021 if positive patient will be transferred to [facility] for possible IR intervention -Continue PPI.  Symptomatic acute on chronic anemia due  to acute blood loss -acute GI bleed  -In the setting of acute GI bleed and underlying anemia of chronic kidney disease --Continues to have episodes of rectal bleeding associated with fatigue, weakness, dizziness and some dyspnea on exertion -Continue to follow hemoglobin trend -Further transfusion as needed.  Obesity, Class I, BMI 30-34.9- (present on admission) -Body mass index is 33.31 kg/m. -Low calorie diet, portion control and increase physical activity discussed with patient.  Chronic kidney disease, stage III (moderate) (HCC)- (present on admission) -Chronic stage IIIb renal failure; appears to be associated with chronic hypertension. -No recent creatinine for comparison of stability -Based on GFR patient stage IIIb status. -Continue to minimize nephrotoxic agents, maintain adequate hydration and avoid the use of contrast/avoid hypotension. -Repeat basic metabolic panel to follow renal function trend.  History of osteoarthritis -Status post bilateral knee replacement -Continue as needed analgesics -Minimize/avoid the use of NSAIDs. -Patient reports pain to be overall stable.  Restless leg syndrome- (present on admission) -Continue the use of Mirapex  Hyperlipidemia- (present on admission) -Continue statins.  Essential hypertension- (present on admission) -Soft blood pressure at time of admission -Blood pressure slightly rising currently; still for control. -Continue holding antihypertensive agents. -Follow vital signs.   COPD  GOLD II- (present on admission) -No shortness of breath or wheezing -Continue to use bronchodilators if needed. -Smoking cessation provided.  Cigarette smoker- (present on admission) -Cessation counseling has been provided -Nicotine patch has been ordered.     Subjective:  -Complains of fatigue and generalized weakness, -No emesis --Continues to have episodes of rectal bleeding associated with fatigue, weakness, dizziness and some  dyspnea on exertion  Physical Exam: Vitals:   03/18/21 1842 03/18/21 2116 03/19/21 0540 03/19/21 1341  BP: 108/60 (!) 145/62 125/77 113/60  Pulse:  80 (!) 105 91  Resp:  20 18 16   Temp:  98.1 F (36.7 C) 98.5 F (36.9 C) 98.4 F (36.9 C)  TempSrc:  Oral Oral Oral  SpO2:  99% 98% 91%  Weight:      Height:        Physical Exam  Gen:- Awake Alert, in no acute distress  HEENT:- [place].AT, No sclera icterus Neck-Supple Neck,No JVD,.  Lungs-  CTAB , fair air movement bilaterally  CV- S1, S2 normal, RRR Abd-  +ve B.Sounds, Abd Soft, No tenderness,    Extremity/Skin:- No  edema,   good pedal pulses  Psych-affect is appropriate, oriented x3 Neuro-generalized weakness, no new focal deficits, no tremors   Data Reviewed:   Family Communication: No family at bedside.  Patient was updated by herself.  Disposition: Status is: -Inpatient  Planned Discharge Destination: Home  Author: Roxan Hockey, MD 03/19/2021 5:50 PM  For on call review [email or web address].

## 2021-03-19 NOTE — Plan of Care (Signed)
  Problem: Education: Goal: Knowledge of General Education information will improve Description Including pain rating scale, medication(s)/side effects and non-pharmacologic comfort measures Outcome: Progressing   Problem: Health Behavior/Discharge Planning: Goal: Ability to manage health-related needs will improve Outcome: Progressing   

## 2021-03-19 NOTE — Progress Notes (Signed)
Small Bowel Givens Capsule Study Procedure date: March 19, 2021  Referring Provider: Roxan Hockey, MD PCP:  Dr. Tarry Kos, Archie Endo, DO  Indication for procedure:   Patient is 71 year old Caucasian female with acute GI bleed for which she underwent colonoscopy followed by esophagogastroduodenoscopy yesterday.  She had blood in distal small bowel as well as colon.  Bleeding site was not identified.  EGD revealed Schatzki's ring, small hiatal hernia gastroduodenitis.  Schatzki's ring was disrupted with focal biopsy and gastric biopsy was taken for routine histology.  GI bleed suspected to be from small bowel.  Patient is therefore undergoing small bowel given capsule study.  Findings:    Patient was able to swallow given capsule without any difficulty.  Study was terminated at 5 hours 18 minutes and 13 seconds after real-time review revealed Stool to be in the colon with burgundy blood. Small clot noted at gastroesophageal junction secondary to recent intervention without stigmata of active bleeding. Clot also noted in proximal stomach. Prepyloric erosion seen on image at 00:04: 51 most likely due to recent cold biopsy.  No stigmata of bleeding. Moderate bulbar and post bulbar duodenitis without stigmata of bleed. Small bowel mucosa otherwise is normal. Clot along with fresh and burgundy blood noted in cecum and proximal colon on several images.  No bleeding lesion identified.  First Gastric image: 2 min and 5 sec First Duodenal image: 16 min and 41 sec First Ileo-Cecal Valve image: 3 hrs 36 min and 35 sec First Cecal image: 3 hrs 50 min and 12 sec Gastric Passage time: 14 min and 16 sec Small Bowel Passage time: 3 hrs and 33 min  Given capsule initially entered duodenal bulb at 2 minutes and 25 seconds but then refluxed back in the stomach and finally entered duodenum at 16 minutes and 41 seconds.  Summary & Recommendations:  Small clot at GE junction and proximal stomach secondary  to recent intervention and other source of bleeding. Gastroduodenitis without stigmata of bleed. Clot along with fresh and burgundy blood noted in the cecum and proximal colon most likely due to vascular abnormality since no lesion noted on this study and also considering colonoscopic findings. We will proceed GI bleeding scan this evening. If GI bleeding scan is positive we will proceed with angiography with therapeutic intention. Patient's hemoglobin has dropped to 7.7 g.  She will therefore receive another unit of PRBCs as discussed with Dr. Roxan Hockey.  Please see saved images under media.

## 2021-03-19 NOTE — Progress Notes (Signed)
Pt off of unit for bleeding scan. PRBC unit infusing.

## 2021-03-19 NOTE — Progress Notes (Signed)
Patient requesting something to eat, asked to contact doctor. Patient informed that she was NPO with sips with meds. MD Rehman made aware. Per MD contact him once GI bleeding scan is completed. Patient and on-coming nurse made aware.

## 2021-03-19 NOTE — Progress Notes (Signed)
Subjective: Patient states she had a few episodes of BRBPR last night and passed a clot on 2 different occasions, she states from 1230-0530 this morning she had no rectal bleeding. She did pass more blood per rectum this morning with a clot noted on chucks pad in her bed. No stool passed since she did prep for her colonoscopy. She reports some nausea and 1 episode of vomiting this morning that was mostly just phlegm, no blood or coffee ground emesis noted. She denies any dizziness, fatigue or worsening SOB (has sob at baseline). No abdominal pain.    Objective: Vital signs in last 24 hours: Temp:  [97.5 F (36.4 C)-98.5 F (36.9 C)] 98.5 F (36.9 C) (02/22 0540) Pulse Rate:  [80-105] 105 (02/22 0540) Resp:  [16-23] 18 (02/22 0540) BP: (92-151)/(42-77) 125/77 (02/22 0540) SpO2:  [97 %-100 %] 98 % (02/22 0540) Weight:  [74.4 kg] 74.4 kg (02/21 1320) Last BM Date : 03/18/21 General:   Alert and oriented, pleasant Head:  Normocephalic and atraumatic. Eyes:  No icterus, sclera clear. Conjuctiva pink.  Mouth:  Without lesions, mucosa pink and moist.  Heart:  S1, S2 present, no murmurs noted.  Lungs: Clear to auscultation bilaterally, without wheezing, rales, or rhonchi.  Abdomen:  Bowel sounds present, soft, non-tender, non-distended. No HSM or hernias noted. No rebound or guarding. No masses appreciated.  Msk:  Symmetrical without gross deformities. Normal posture. Pulses:  Normal pulses noted. Extremities:  Without clubbing or edema. Neurologic:  Alert and  oriented x4;  grossly normal neurologically. Skin:  Warm and dry, intact without significant lesions.  Psych:  Alert and cooperative. Normal mood and affect.  Intake/Output from previous day: 02/21 0701 - 02/22 0700 In: 1180 [P.O.:480; I.V.:700] Out: -  Intake/Output this shift: No intake/output data recorded.  Lab Results: Recent Labs    03/17/21 1000 03/17/21 1249 03/18/21 0515  WBC 14.4*  --  11.6*  HGB 11.5* 9.4* 10.4*   HCT 36.5 30.0* 32.9*  PLT 449*  --  344   BMET Recent Labs    03/17/21 1000 03/18/21 0515  NA 137 137  K 4.1 4.0  CL 99 103  CO2 27 29  GLUCOSE 150* 91  BUN 32* 22  CREATININE 1.65* 1.26*  CALCIUM 8.7* 8.2*   LFT Recent Labs    03/17/21 1000  PROT 5.8*  ALBUMIN 3.4*  AST 14*  ALT 14  ALKPHOS 47  BILITOT 0.6   PT/INR Recent Labs    03/17/21 1000  LABPROT 12.6  INR 0.9    Studies/Results: CT ANGIO GI BLEED  Result Date: 03/17/2021 CLINICAL DATA:  A 71 year old presents with rectal bleeding. EXAM: CTA ABDOMEN AND PELVIS WITHOUT AND WITH CONTRAST TECHNIQUE: Multidetector CT imaging of the abdomen and pelvis was performed using the standard protocol during bolus administration of intravenous contrast. Multiplanar reconstructed images and MIPs were obtained and reviewed to evaluate the vascular anatomy. RADIATION DOSE REDUCTION: This exam was performed according to the departmental dose-optimization program which includes automated exposure control, adjustment of the mA and/or kV according to patient size and/or use of iterative reconstruction technique. CONTRAST:  82mL OMNIPAQUE IOHEXOL 350 MG/ML SOLN COMPARISON:  None FINDINGS: VASCULAR Aorta: Calcified and noncalcified atheromatous plaque throughout the abdominal aorta. Maximal caliber of the infrarenal abdominal aorta is 2.5 cm, nonaneurysmal. No signs of stranding adjacent to the abdominal aorta. Celiac: Calcific and noncalcified plaque near the origin without substantial narrowing. No signs of stranding or dissection. No aneurysmal dilation. SMA: Widely patent without  signs of dissection or aneurysm. Atheromatous plaque near the origin of the celiac as well. Renals: Single renal arteries bilaterally which are patent. IMA: Patent without evidence of aneurysm, dissection, vasculitis or significant stenosis. Inflow: Patent without evidence of aneurysm, dissection, vasculitis or significant stenosis. Proximal Outflow: Bilateral  common femoral and visualized portions of the superficial and profunda femoral arteries are patent without evidence of aneurysm, dissection, vasculitis or significant stenosis. Veins: No obvious venous abnormality within the limitations of this arterial phase study. Review of the MIP images confirms the above findings. NON-VASCULAR Lower chest: No effusion.  No consolidation. Hepatobiliary: No focal liver abnormality is seen. No gallstones, gallbladder wall thickening, or biliary dilatation. Pancreas: Normal, without mass, inflammation or ductal dilatation. Spleen: Normal. Adrenals/Urinary Tract: Mild cortical scarring and parenchymal loss of the bilateral kidneys without signs of hydronephrosis. No perivesical stranding. No nephrolithiasis or suspicious renal lesion. Potential subtle striated nephrogram bilaterally. Stomach/Bowel: Mild gastric thickening. No visible areas to suggest active extravasation in the gastrointestinal tract. While no definite active extravasation is seen in the GI tract there is a prominent vessel that enters the stomach (image 30/11) this is near the mid body along the greater curvature and is also seen along that proximal body at the junction of body and fundus on the coronal images image 36/13. This does not appear to change in there is no high density material in the lumen currently to suggest active bleeding in this area. Moderate diverticular disease and diverticulosis of the sigmoid. No surrounding stranding. No perienteric stranding. Lymphatic: No adenopathy in the abdomen or in the pelvis. Reproductive: Unremarkable. Other: No pneumatosis.  No free air.  No ascites. Musculoskeletal: No acute or significant osseous findings. IMPRESSION: 1. Prominent vessel that appears to penetrate the wall of the stomach near the fundal/body junction along the greater curvature suspicious for Dieulafoy's lesion, a dilated vessel that can lead to gastrointestinal bleeding, but without definitive  signs of active extravasation at this time. This may be incidental or could be related to current presentation. Suggest follow-up endoscopy for further evaluation. 2. Signs of sigmoid diverticular disease without visible signs of extravasation. Nephrogram could be related to scarring but would correlate with any clinical or laboratory evidence of urinary tract infection/pyelonephritis. 3. Subtle striated 4. Aortic atherosclerosis. Aortic Atherosclerosis (ICD10-I70.0). Electronically Signed   By: Zetta Bills M.D.   On: 03/17/2021 13:24    Assessment: Rhonda Briggs is a 71 year old female with a history of chronic anemia, hyperlipidemia, HTN, kidney stones, chronic NSAID use and arthritis. She was brought in by EMS for rectal bleeding. Hgb 11.5 on presentation then declined to 9.4 with no evidence of active extravasation on CT angio. GI was consulted for lower GI bleed.   GI bleed: rectal bleeding that began on 2/20 with multiple episodes reported. S/p 1 unit PRBCs on 2/20. CTA A/P with prominent vessel along greater curvature of stomach, suspicious for Dieulafoy lesion, as well as diverticular disease in sigmoid colon, no active extravasation. Underwent EGD and colonoscopy on 2/21 with findings of Non-obstructing Schatzki ring, Gastritis and duodenitis +  Blood in the terminal ileum, Blood in the entire examined colon,  One 4 mm polyp in the ascending colon, and Diverticulosis in the sigmoid colon, recommendations to repeat colonoscopy in 3 months for screening purposes and proceed with Givens capsule study today if rectal bleeding continued and/or hgb continued to decline. Hgb yesterday was 10.4. patient continued to have several episodes of moderate BRBPR last night and this morning with  some nausea and 1 episode of vomiting, hgb dropped to 8.2 today. Will proceed with capsule study for further evaluation. Procedure was discussed with patient yesterday by Dr. Jenetta Downer and patient was in agreement to  proceed today if indicated.   Plan: Givens capsule study today (to be read tomorrow) Continue to trend H&H, transfuse for hgb <7 Continue PPI BID Remain NPO Repeat colonoscopy in 3 months for screening purposes Zofran q6h PRN   LOS: 0 days    03/19/2021, 9:22 AM   Akhilesh Sassone L. Alver Sorrow, MSN, APRN, AGNP-C Adult-Gerontology Nurse Practitioner [facility]

## 2021-03-19 NOTE — Progress Notes (Signed)
Blood transfusion completed while off unit for bleeding scan. Post vital signs completed per nursing supervisor-Katherine, RN. No adverse effects reported. Pt now back on unit, scheduled meds given at this time. Diet changed to Heart Healthy per Dr. Laural Golden, with ordered CBC in am.

## 2021-03-20 ENCOUNTER — Encounter (HOSPITAL_COMMUNITY): Payer: Self-pay | Admitting: Internal Medicine

## 2021-03-20 ENCOUNTER — Telehealth: Payer: Self-pay | Admitting: Gastroenterology

## 2021-03-20 DIAGNOSIS — D649 Anemia, unspecified: Secondary | ICD-10-CM | POA: Diagnosis not present

## 2021-03-20 DIAGNOSIS — K922 Gastrointestinal hemorrhage, unspecified: Secondary | ICD-10-CM | POA: Diagnosis not present

## 2021-03-20 LAB — RENAL FUNCTION PANEL
Albumin: 3 g/dL — ABNORMAL LOW (ref 3.5–5.0)
Anion gap: 9 (ref 5–15)
BUN: 15 mg/dL (ref 8–23)
CO2: 29 mmol/L (ref 22–32)
Calcium: 8.2 mg/dL — ABNORMAL LOW (ref 8.9–10.3)
Chloride: 100 mmol/L (ref 98–111)
Creatinine, Ser: 1.34 mg/dL — ABNORMAL HIGH (ref 0.44–1.00)
GFR, Estimated: 43 mL/min — ABNORMAL LOW (ref >60)
Glucose, Bld: 96 mg/dL (ref 70–99)
Phosphorus: 3.5 mg/dL (ref 2.5–4.6)
Potassium: 3.7 mmol/L (ref 3.5–5.1)
Sodium: 138 mmol/L (ref 135–145)

## 2021-03-20 LAB — TYPE AND SCREEN
ABO/RH(D): A NEG
Antibody Screen: NEGATIVE
Unit division: 0
Unit division: 0

## 2021-03-20 LAB — HEMOGLOBIN AND HEMATOCRIT, BLOOD
HCT: 30.5 % — ABNORMAL LOW (ref 36.0–46.0)
Hemoglobin: 10 g/dL — ABNORMAL LOW (ref 12.0–15.0)

## 2021-03-20 LAB — BPAM RBC
Blood Product Expiration Date: 202303052359
Blood Product Expiration Date: 202303212359
ISSUE DATE / TIME: 202302201538
ISSUE DATE / TIME: 202302221900
Unit Type and Rh: 600
Unit Type and Rh: 600

## 2021-03-20 LAB — CBC
HCT: 29.2 % — ABNORMAL LOW (ref 36.0–46.0)
Hemoglobin: 9.2 g/dL — ABNORMAL LOW (ref 12.0–15.0)
MCH: 28.8 pg (ref 26.0–34.0)
MCHC: 31.5 g/dL (ref 30.0–36.0)
MCV: 91.3 fL (ref 80.0–100.0)
Platelets: 281 10*3/uL (ref 150–400)
RBC: 3.2 MIL/uL — ABNORMAL LOW (ref 3.87–5.11)
RDW: 17.4 % — ABNORMAL HIGH (ref 11.5–15.5)
WBC: 10.9 10*3/uL — ABNORMAL HIGH (ref 4.0–10.5)
nRBC: 0 % (ref 0.0–0.2)

## 2021-03-20 MED ORDER — FOLIC ACID 1 MG PO TABS: 1.0000 mg | ORAL_TABLET | Freq: Every day | ORAL | 3 refills | Status: DC

## 2021-03-20 MED ORDER — VENTOLIN HFA 108 (90 BASE) MCG/ACT IN AERS: 2.0000 | INHALATION_SPRAY | Freq: Four times a day (QID) | RESPIRATORY_TRACT | 1 refills | Status: DC | PRN

## 2021-03-20 MED ORDER — NICOTINE 14 MG/24HR TD PT24: 14.0000 mg | MEDICATED_PATCH | Freq: Every day | TRANSDERMAL | 0 refills | Status: DC

## 2021-03-20 MED ORDER — PANTOPRAZOLE SODIUM 40 MG PO TBEC: 40.0000 mg | DELAYED_RELEASE_TABLET | Freq: Two times a day (BID) | ORAL | 1 refills | Status: AC

## 2021-03-20 MED ORDER — DILTIAZEM HCL 25 MG/5ML IV SOLN: 10.0000 mg | Freq: Once | INTRAVENOUS | Status: DC

## 2021-03-20 NOTE — Telephone Encounter (Addendum)
Spoke with Dr. Abbey Chatters. We need to schedule her for a colonoscopy in six week. Does NOT need ov first. Please cancel OV for March.  Please schedule colonoscopy and EGD with Dr. Abbey Chatters in 6 weeks or so. ASA 3. DX: obscure GI bleeding  We will still plan for the blood work in one week as requested by [employer]. PATIENT WANTS [facility]

## 2021-03-20 NOTE — Progress Notes (Signed)
Nsg Discharge Note  Admit Date:  03/17/2021 Discharge date: 03/20/2021   Rhonda Briggs to be D/C'd Home per MD order.  AVS completed.  Copy for chart, and copy for patient signed, and dated. Patient/caregiver able to verbalize understanding.  Discharge Medication: Allergies as of 03/20/2021       Reactions   Amoxicillin Nausea And Vomiting        Medication List     STOP taking these medications    hydrochlorothiazide 25 MG tablet Commonly known as: HYDRODIURIL   Valtrex 1000 MG tablet Generic drug: valACYclovir       TAKE these medications    calcium gluconate 500 MG tablet Take 1 tablet by mouth 2 (two) times daily.   CENTRUM SILVER 50+WOMEN PO Take 1 tablet by mouth daily in the afternoon.   cholecalciferol 25 MCG (1000 UNIT) tablet Commonly known as: VITAMIN D Take 1,000 Units by mouth daily. Takes 2 a day   folic acid 1 MG tablet Commonly known as: FOLVITE Take 1 tablet (1 mg total) by mouth daily.   gabapentin 100 MG capsule Commonly known as: NEURONTIN Take 100 mg by mouth in the morning, at noon, and at bedtime.   methotrexate 2.5 MG tablet Commonly known as: RHEUMATREX Take 5 tablets by mouth once a week.   neomycin-polymyxin b-dexamethasone 3.5-10000-0.1 Oint Commonly known as: MAXITROL Place 1 application into the right eye at bedtime.   nicotine 14 mg/24hr patch Commonly known as: NICODERM CQ - dosed in mg/24 hours Place 1 patch (14 mg total) onto the skin daily. Start taking on: March 21, 2021   Oyster Shell Calcium w/D 500-200 MG-UNIT Tabs Take 1 tablet by mouth 2 (two) times daily.   pantoprazole 40 MG tablet Commonly known as: Protonix Take 1 tablet (40 mg total) by mouth 2 (two) times daily.   pramipexole 0.125 MG tablet Commonly known as: MIRAPEX Take 0.25 mg by mouth 2 (two) times daily as needed (restless leg).   pravastatin 20 MG tablet Commonly known as: PRAVACHOL Take 20 mg by mouth daily.   tobramycin-dexamethasone  ophthalmic solution Commonly known as: TOBRADEX Place 1 drop into the right eye every 6 (six) hours.   Ventolin HFA 108 (90 Base) MCG/ACT inhaler Generic drug: albuterol Inhale 2 puffs into the lungs every 6 (six) hours as needed for wheezing or shortness of breath. What changed: how much to take        Discharge Assessment: Vitals:   03/20/21 0548 03/20/21 1415  BP: 122/64 110/68  Pulse: 91 88  Resp: 18 16  Temp: 98.3 F (36.8 C) 98.5 F (36.9 C)  SpO2: 96% 96%   Skin clean, dry and intact without evidence of skin break down, no evidence of skin tears noted. IV catheter discontinued intact. Site without signs and symptoms of complications - no redness or edema noted at insertion site, patient denies c/o pain - only slight tenderness at site.  Dressing with slight pressure applied.  D/c Instructions-Education: Discharge instructions given to patient/family with verbalized understanding. D/c education completed with patient/family including follow up instructions, medication list, d/c activities limitations if indicated, with other d/c instructions as indicated by MD - patient able to verbalize understanding, all questions fully answered. Patient instructed to return to ED, call 911, or call MD for any changes in condition.  Patient escorted via [place], and D/C home via private auto.  Loa Socks RN 03/20/2021 5:33 PM

## 2021-03-20 NOTE — Discharge Instructions (Signed)
1)Avoid ibuprofen/Advil/Aleve/Motrin/Goody Powders/Naproxen/BC powders/Meloxicam/Diclofenac/Indomethacin and other Nonsteroidal anti-inflammatory medications as these will make you more likely to bleed and can cause stomach ulcers, can also cause Kidney problems.   2)Please schedule colonoscopy and EGD with Dr. Abbey Chatters in 6 weeks or so.  Follow-up Gastroenterologist Dr. Hurshel Keys with [facility]--- for evaluation  -address: [address] [place], [place] [address], Phone: [phone]  3)Repeat CBC in 5 to 6 days at [facility]  4) please call or return if bright red blood per rectum, or any other bleeding concerns including persistent dark stools or  persistent dizziness, or worsening shortness of breath with activity

## 2021-03-20 NOTE — Discharge Summary (Signed)
Rhonda Briggs, is a 71 y.o. female  DOB Nov 01, 1950  MRN [id number].  Admission date:  03/17/2021  Admitting Physician  Roxan Hockey, MD  Discharge Date:  03/20/2021   Primary MD  Danna Hefty, DO  Recommendations for primary care physician for things to follow:   1)Avoid ibuprofen/Advil/Aleve/Motrin/Goody Powders/Naproxen/BC powders/Meloxicam/Diclofenac/Indomethacin and other Nonsteroidal anti-inflammatory medications as these will make you more likely to bleed and can cause stomach ulcers, can also cause Kidney problems.   2)Please schedule colonoscopy and EGD with Dr. Abbey Chatters in 6 weeks or so.  Follow-up Gastroenterologist Dr. Hurshel Keys with [facility]--- for evaluation  -address: [address] [place], [place] [address], Phone: [phone]  3)Repeat CBC in 5 to 6 days at [facility]  4) please call or return if bright red blood per rectum, or any other bleeding concerns including persistent dark stools or  persistent dizziness, or worsening shortness of breath with activity  Admission Diagnosis  Lower GI bleed [K92.2] Anemia, unspecified type [D64.9] Acute GI bleeding [K92.2]   Discharge Diagnosis  Lower GI bleed [K92.2] Anemia, unspecified type [D64.9] Acute GI bleeding [K92.2]    Principal Problem:   Lower GI bleed Active Problems:   Cigarette smoker   COPD  GOLD II   Essential hypertension   Hyperlipidemia   Restless leg syndrome   History of osteoarthritis   Chronic kidney disease, stage III (moderate) (HCC)   Obesity, Class I, BMI 30-34.9   Anemia   Acute GI bleeding      Past Medical History:  Diagnosis Date   Anemia    Arthritis    History of kidney stones    Hyperlipidemia    Hypertension    Osteoporosis    PONV (postoperative nausea and vomiting)    after hernia surgery    Past Surgical History:  Procedure Laterality Date   BIOPSY   03/18/2021   Procedure: BIOPSY;  Surgeon: Harvel Quale, MD;  [facility];  Service: Gastroenterology;;   Wilmon Pali RELEASE Right 2011   COLONOSCOPY WITH PROPOFOL N/A 03/18/2021   Procedure: COLONOSCOPY WITH PROPOFOL;  Surgeon: Harvel Quale, MD;  [facility];  Service: Gastroenterology;  Laterality: N/A;   ESOPHAGOGASTRODUODENOSCOPY (EGD) WITH PROPOFOL N/A 03/18/2021   Procedure: ESOPHAGOGASTRODUODENOSCOPY (EGD) WITH PROPOFOL;  Surgeon: Harvel Quale, MD;  [facility];  Service: Gastroenterology;  Laterality: N/A;   GIVENS CAPSULE STUDY N/A 03/19/2021   Procedure: GIVENS CAPSULE STUDY;  Surgeon: Rogene Houston, MD;  [facility];  Service: Endoscopy;  Laterality: N/A;   HERNIA REPAIR Left    inguinal   TOTAL KNEE ARTHROPLASTY Right 08/19/2016   Procedure: TOTAL KNEE ARTHROPLASTY;  Surgeon: Carole Civil, MD;  Location: [facility];  Service: Orthopedics;  Laterality: Right;   TOTAL KNEE ARTHROPLASTY Left 12/31/2016   Procedure: TOTAL KNEE ARTHROPLASTY;  Surgeon: Carole Civil, MD;  Location: [facility];  Service: Orthopedics;  Laterality: Left;   TUBAL LIGATION  HPI  from the history and physical done on the day of admission:     HPI: Rhonda Briggs is a 71 y.o. female with medical history significant of tobacco abuse, anemia of chronic kidney disease, chronic kidney disease a stage IIIb, hypertension, hyperlipidemia, osteoporosis and arthritis; presented to the emergency department secondary to abdominal pain and lower GI bleed.  Patient reports waking up due to abdominal discomfort followed by dark red clots and bright red blood diarrhea.  Patient reports abdominal discomfort improved/resolved after bowel movement.  Rectal bleeding appreciated in the ED. Patient expressed associated lightheadedness, dizziness and mild palpitations.  In the ED blood pressure found to be soft.  She responded to fluid  resuscitation.   Negative COVID PCR; hemoglobin 11.5 on arrival with subsequent drop to 9.4 almost 3 hours later.   Patient reported family history of colon cancer in her dad and has never had any screening work-up.   Of note; she has had recent diagnosis of shingles in her temporal region for what she has been using NSAIDs products and Tylenol to assist with pain.   Review of Systems: As mentioned in the history of present illness. All other systems reviewed and are negative.     Hospital Course:   Brief hospital admission narrative: Rhonda Briggs is a 71 y.o. female with medical history significant of tobacco abuse, anemia of chronic kidney disease, chronic kidney disease a stage IIIb, hypertension, hyperlipidemia, osteoporosis and arthritis; presented to the emergency department secondary to abdominal pain and lower GI bleed.  Patient reports waking up due to abdominal discomfort followed by dark red clots and bright red blood diarrhea.  Patient reports abdominal discomfort improved/resolved after bowel movement.  Rectal bleeding appreciated in the ED. Patient expressed associated lightheadedness, dizziness and mild palpitations.  In the ED blood pressure found to be soft.  She responded to fluid resuscitation.   Negative COVID PCR; hemoglobin 11.5 on arrival with subsequent drop to 9.4 almost 3 hours later.   Patient reported family history of colon cancer in her dad and has never had any screening work-up.   Of note; she has had recent diagnosis of shingles in her temporal region for what she has been using NSAIDs products and Tylenol to assist with pain.   Assessment and Plan: -GI bleed- (present on admission) - -Status post EGD and colonoscopy;  -- Patient passed the capsule in a.m. of on 03/20/2021 with some dark stool-- Hgb checked about 7 to 8 hours after episode of dark stool showed Hgb was now up to 10.0 -Capsule endoscopy on 03/19/2021 suggest possible ongoing/acute GI  bleed -RBC tagged study/bleeding scan ordered for 03/19/2021 without obvious bleeding sites -CTA Angio with GI bleed protocol from 03/17/2021 without obvious bleeding site -Discussed with GI service per GI service okay to discharge home today with repeat colonoscopy in 6 weeks, repeat CBC as outpatient in 5 to 6 days -Patient to return if bleeding concerns and she will probably need a repeat CT angio Gi bleed protocol or RBC tag scan protocol ---Continue PPI.   Symptomatic acute on chronic anemia due to acute blood loss -acute GI bleed  -In the setting of acute GI bleed and underlying anemia of chronic kidney disease -Hemoglobin is improved to 10.0 posttransfusion no further dizziness or dyspnea on exertion   Obesity, Class I, BMI 30-34.9- (present on admission) -Body mass index is 33.31 kg/m. -Low calorie diet, portion control and increase physical activity discussed with patient.   Chronic kidney disease, stage III (  moderate) ([place])- (present on admission) -Chronic stage IIIb renal failure;  -appears to be associated with chronic hypertension. -No recent creatinine for comparison of stability -Stable   History of osteoarthritis -Status post bilateral knee replacement -Minimize/avoid the use of NSAIDs. -Patient reports pain to be overall stable.   Restless leg syndrome- (present on admission) -Continue the use of Mirapex   Hyperlipidemia- (present on admission) -Continue statins.   Essential hypertension- (present on admission) -- Stable, please see discharge med rec for medication adjustments    COPD  GOLD II- (present on admission) -No shortness of breath or wheezing -Continue to use bronchodilators if needed. -Smoking cessation provided.   Cigarette smoker- (present on admission) -Cessation counseling has been provided -Nicotine patch has been ordered.  Discharge Condition: stable  Follow UP   Follow-up Information     Mahala Menghini, PA-C Follow up on 04/15/2021.    Specialty: Gastroenterology Why: Appointment at [facility]. April 15, 2021 at 3:00pm Contact information: [address] [place] [address] [phone]                 Diet and Activity recommendation:  As advised  Discharge Instructions    Discharge Instructions     Call MD for:  difficulty breathing, headache or visual disturbances   Complete by: As directed    Call MD for:  persistant dizziness or light-headedness   Complete by: As directed    Call MD for:  persistant nausea and vomiting   Complete by: As directed    Call MD for:  temperature >100.4   Complete by: As directed    Diet - low sodium heart healthy   Complete by: As directed    Discharge instructions   Complete by: As directed    1)Avoid ibuprofen/Advil/Aleve/Motrin/Goody Powders/Naproxen/BC powders/Meloxicam/Diclofenac/Indomethacin and other Nonsteroidal anti-inflammatory medications as these will make you more likely to bleed and can cause stomach ulcers, can also cause Kidney problems.   2)Please schedule colonoscopy and EGD with Dr. Abbey Chatters in 6 weeks or so.  Follow-up Gastroenterologist Dr. Hurshel Keys with [facility]--- for evaluation  -address: [address] [place], [place] [address], Phone: [phone]  3)Repeat CBC in 5 to 6 days at [facility]  4) please call or return if bright red blood per rectum, or any other bleeding concerns including persistent dark stools or  persistent dizziness, or worsening shortness of breath with activity   Increase activity slowly   Complete by: As directed         Discharge Medications     Allergies as of 03/20/2021       Reactions   Amoxicillin Nausea And Vomiting        Medication List     STOP taking these medications    hydrochlorothiazide 25 MG tablet Commonly known as: HYDRODIURIL   Valtrex 1000 MG tablet Generic drug: valACYclovir       TAKE these medications    calcium gluconate 500 MG  tablet Take 1 tablet by mouth 2 (two) times daily.   CENTRUM SILVER 50+WOMEN PO Take 1 tablet by mouth daily in the afternoon.   cholecalciferol 25 MCG (1000 UNIT) tablet Commonly known as: VITAMIN D Take 1,000 Units by mouth daily. Takes 2 a day   folic acid 1 MG tablet Commonly known as: FOLVITE Take 1 tablet (1 mg total) by mouth daily.   gabapentin 100 MG capsule Commonly known as: NEURONTIN Take 100 mg by mouth in the morning, at noon, and at bedtime.  methotrexate 2.5 MG tablet Commonly known as: RHEUMATREX Take 5 tablets by mouth once a week.   neomycin-polymyxin b-dexamethasone 3.5-10000-0.1 Oint Commonly known as: MAXITROL Place 1 application into the right eye at bedtime.   nicotine 14 mg/24hr patch Commonly known as: NICODERM CQ - dosed in mg/24 hours Place 1 patch (14 mg total) onto the skin daily. Start taking on: March 21, 2021   Oyster Shell Calcium w/D 500-200 MG-UNIT Tabs Take 1 tablet by mouth 2 (two) times daily.   pantoprazole 40 MG tablet Commonly known as: Protonix Take 1 tablet (40 mg total) by mouth 2 (two) times daily.   pramipexole 0.125 MG tablet Commonly known as: MIRAPEX Take 0.25 mg by mouth 2 (two) times daily as needed (restless leg).   pravastatin 20 MG tablet Commonly known as: PRAVACHOL Take 20 mg by mouth daily.   tobramycin-dexamethasone ophthalmic solution Commonly known as: TOBRADEX Place 1 drop into the right eye every 6 (six) hours.   Ventolin HFA 108 (90 Base) MCG/ACT inhaler Generic drug: albuterol Inhale 2 puffs into the lungs every 6 (six) hours as needed for wheezing or shortness of breath. What changed: how much to take        Major procedures and Radiology Reports - PLEASE review detailed and final reports for all details, in brief -    NM GI Blood Loss  Result Date: 03/19/2021 CLINICAL DATA:  GI bleeding for several days EXAM: NUCLEAR MEDICINE GASTROINTESTINAL BLEEDING SCAN TECHNIQUE: Sequential  abdominal images were obtained following intravenous administration of Tc-20m labeled red blood cells. RADIOPHARMACEUTICALS:  24 mCi Tc-71m pertechnetate in-vitro labeled red cells. COMPARISON:  None. FINDINGS: Adequate uptake is noted throughout the cardiac blood pool, liver and spleen. Vascular structures are noted. Pooling in the urinary bladder is seen. No focal area of increased activity is identified to suggest active GI hemorrhage. IMPRESSION: No findings to suggest active GI hemorrhage. Electronically Signed   By: Inez Catalina M.D.   On: 03/19/2021 22:43   CT ANGIO GI BLEED  Result Date: 03/17/2021 CLINICAL DATA:  A 71 year old presents with rectal bleeding. EXAM: CTA ABDOMEN AND PELVIS WITHOUT AND WITH CONTRAST TECHNIQUE: Multidetector CT imaging of the abdomen and pelvis was performed using the standard protocol during bolus administration of intravenous contrast. Multiplanar reconstructed images and MIPs were obtained and reviewed to evaluate the vascular anatomy. RADIATION DOSE REDUCTION: This exam was performed according to the departmental dose-optimization program which includes automated exposure control, adjustment of the mA and/or kV according to patient size and/or use of iterative reconstruction technique. CONTRAST:  106mL OMNIPAQUE IOHEXOL 350 MG/ML SOLN COMPARISON:  None FINDINGS: VASCULAR Aorta: Calcified and noncalcified atheromatous plaque throughout the abdominal aorta. Maximal caliber of the infrarenal abdominal aorta is 2.5 cm, nonaneurysmal. No signs of stranding adjacent to the abdominal aorta. Celiac: Calcific and noncalcified plaque near the origin without substantial narrowing. No signs of stranding or dissection. No aneurysmal dilation. SMA: Widely patent without signs of dissection or aneurysm. Atheromatous plaque near the origin of the celiac as well. Renals: Single renal arteries bilaterally which are patent. IMA: Patent without evidence of aneurysm, dissection, vasculitis or  significant stenosis. Inflow: Patent without evidence of aneurysm, dissection, vasculitis or significant stenosis. Proximal Outflow: Bilateral common femoral and visualized portions of the superficial and profunda femoral arteries are patent without evidence of aneurysm, dissection, vasculitis or significant stenosis. Veins: No obvious venous abnormality within the limitations of this arterial phase study. Review of the MIP images confirms the above findings. NON-VASCULAR Lower  chest: No effusion.  No consolidation. Hepatobiliary: No focal liver abnormality is seen. No gallstones, gallbladder wall thickening, or biliary dilatation. Pancreas: Normal, without mass, inflammation or ductal dilatation. Spleen: Normal. Adrenals/Urinary Tract: Mild cortical scarring and parenchymal loss of the bilateral kidneys without signs of hydronephrosis. No perivesical stranding. No nephrolithiasis or suspicious renal lesion. Potential subtle striated nephrogram bilaterally. Stomach/Bowel: Mild gastric thickening. No visible areas to suggest active extravasation in the gastrointestinal tract. While no definite active extravasation is seen in the GI tract there is a prominent vessel that enters the stomach (image 30/11) this is near the mid body along the greater curvature and is also seen along that proximal body at the junction of body and fundus on the coronal images image 36/13. This does not appear to change in there is no high density material in the lumen currently to suggest active bleeding in this area. Moderate diverticular disease and diverticulosis of the sigmoid. No surrounding stranding. No perienteric stranding. Lymphatic: No adenopathy in the abdomen or in the pelvis. Reproductive: Unremarkable. Other: No pneumatosis.  No free air.  No ascites. Musculoskeletal: No acute or significant osseous findings. IMPRESSION: 1. Prominent vessel that appears to penetrate the wall of the stomach near the fundal/body junction along  the greater curvature suspicious for Dieulafoy's lesion, a dilated vessel that can lead to gastrointestinal bleeding, but without definitive signs of active extravasation at this time. This may be incidental or could be related to current presentation. Suggest follow-up endoscopy for further evaluation. 2. Signs of sigmoid diverticular disease without visible signs of extravasation. Nephrogram could be related to scarring but would correlate with any clinical or laboratory evidence of urinary tract infection/pyelonephritis. 3. Subtle striated 4. Aortic atherosclerosis. Aortic Atherosclerosis (ICD10-I70.0). Electronically Signed   By: Zetta Bills M.D.   On: 03/17/2021 13:24    Micro Results    Recent Results (from the past 240 hour(s))  Resp Panel by RT-PCR (Flu A&B, Covid) Nasopharyngeal Swab     Status: None   Collection Time: 03/17/21 10:00 AM   Specimen: Nasopharyngeal Swab; Nasopharyngeal(NP) swabs in vial transport medium  Result Value Ref Range Status   SARS Coronavirus 2 by RT PCR NEGATIVE NEGATIVE Final    Comment: (NOTE) SARS-CoV-2 target nucleic acids are NOT DETECTED.  The SARS-CoV-2 RNA is generally detectable in upper respiratory specimens during the acute phase of infection. The lowest concentration of SARS-CoV-2 viral copies this assay can detect is 138 copies/mL. A negative result does not preclude SARS-Cov-2 infection and should not be used as the sole basis for treatment or other patient management decisions. A negative result may occur with  improper specimen collection/handling, submission of specimen other than nasopharyngeal swab, presence of viral mutation(s) within the areas targeted by this assay, and inadequate number of viral copies(<138 copies/mL). A negative result must be combined with clinical observations, patient history, and epidemiological information. The expected result is Negative.  Fact Sheet for Patients:   EntrepreneurPulse.com.au  Fact Sheet for Healthcare Providers:  IncredibleEmployment.be  This test is no t yet approved or cleared by the [place] FDA and  has been authorized for detection and/or diagnosis of SARS-CoV-2 by FDA under an Emergency Use Authorization (EUA). This EUA will remain  in effect (meaning this test can be used) for the duration of the COVID-19 declaration under Section 564(b)(1) of the Act, 21 U.S.C.section 360bbb-3(b)(1), unless the authorization is terminated  or revoked sooner.       Influenza A by PCR NEGATIVE NEGATIVE Final  Influenza B by PCR NEGATIVE NEGATIVE Final    Comment: (NOTE) The Xpert Xpress SARS-CoV-2/FLU/RSV plus assay is intended as an aid in the diagnosis of influenza from Nasopharyngeal swab specimens and should not be used as a sole basis for treatment. Nasal washings and aspirates are unacceptable for Xpert Xpress SARS-CoV-2/FLU/RSV testing.  Fact Sheet for Patients: EntrepreneurPulse.com.au  Fact Sheet for Healthcare Providers: IncredibleEmployment.be  This test is not yet approved or cleared by the [place] FDA and has been authorized for detection and/or diagnosis of SARS-CoV-2 by FDA under an Emergency Use Authorization (EUA). This EUA will remain in effect (meaning this test can be used) for the duration of the COVID-19 declaration under Section 564(b)(1) of the Act, 21 U.S.C. section 360bbb-3(b)(1), unless the authorization is terminated or revoked.  Performed at [facility], [address], [place], [place] [address]     Today   Subjective    Serita Degroote today has no new complaints         Patient passed the capsule in a.m. of on 03/20/2021 with some dark stool-- Hgb checked about 7 to 8 hours after episode of dark stool showed Hgb was now up to 10.0 -No further dizziness, no dyspnea on exertion, no abdominal pain,  Patient has been  seen and examined prior to discharge   Objective   Blood pressure 110/68, pulse 88, temperature 98.5 F (36.9 C), temperature source Oral, resp. rate 16, height 4\' 11"  (1.499 m), weight 74.4 kg, SpO2 96 %.   Intake/Output Summary (Last 24 hours) at 03/20/2021 1715 Last data filed at 03/20/2021 1300 Gross per 24 hour  Intake 861 ml  Output --  Net 861 ml   Exam Gen:- Awake Alert, no acute distress  HEENT:- [place].AT, No sclera icterus Neck-Supple Neck,No JVD,.  Lungs-  CTAB , good air movement bilaterally CV- S1, S2 normal, regular Abd-  +ve B.Sounds, Abd Soft, No tenderness,    Extremity/Skin:- No  edema,   good pulses Psych-affect is appropriate, oriented x3 Neuro-no new focal deficits, no tremors    Data Review   CBC w Diff:  Lab Results  Component Value Date   WBC 10.9 (H) 03/20/2021   HGB 10.0 (L) 03/20/2021   HCT 30.5 (L) 03/20/2021   PLT 281 03/20/2021   LYMPHOPCT 19 03/17/2021   MONOPCT 6 03/17/2021   EOSPCT 2 03/17/2021   BASOPCT 0 03/17/2021    CMP:  Lab Results  Component Value Date   NA 138 03/20/2021   K 3.7 03/20/2021   CL 100 03/20/2021   CO2 29 03/20/2021   BUN 15 03/20/2021   CREATININE 1.34 (H) 03/20/2021   PROT 5.8 (L) 03/17/2021   ALBUMIN 3.0 (L) 03/20/2021   BILITOT 0.6 03/17/2021   ALKPHOS 47 03/17/2021   AST 14 (L) 03/17/2021   ALT 14 03/17/2021    Total Discharge time is about 33 minutes  Roxan Hockey M.D on 03/20/2021 at 5:15 PM  Go to [email or web address] -  for contact info  [facility] - Office  [phone]

## 2021-03-20 NOTE — Telephone Encounter (Signed)
Erline Levine - please arrange hospital f/u in office in 6 weeks.   Daysie Helf- please arrange for H/H in 1 week.

## 2021-03-20 NOTE — Progress Notes (Signed)
Pt has had no active bleeding noted this shift. No complaints verbalized. Vital signs stable.

## 2021-03-20 NOTE — Care Management Important Message (Signed)
Important Message  Patient Details  Name: Rhonda Briggs MRN: [id number] Date of Birth: 1950/12/23   Medicare Important Message Given:  Yes     [name] 03/20/2021, 11:57 AM

## 2021-03-20 NOTE — Progress Notes (Signed)
Pt. Just had large bowel movement. All blood with clots noted. Pt. Did pass the camera capsule.

## 2021-03-20 NOTE — Progress Notes (Signed)
Gastroenterology Progress Note   Referring Provider: No ref. provider found Primary Care Physician:  Danna Hefty, DO Primary Gastroenterologist:  Dr. Abbey Chatters  Patient ID: Rhonda Briggs; [id number]; Jan 07, 1951    Subjective   Continues to deny abdominal pain. Reported no bowel movements overnight. She had one BM this morning that was dark with clots. She tolerated her breakfast fine. No N/V. Denies dizziness, chest pain, or shortness of breath.    Objective   Vital signs in last 24 hours Temp:  [98.2 F (36.8 C)-98.7 F (37.1 C)] 98.5 F (36.9 C) (02/23 1415) Pulse Rate:  [79-94] 88 (02/23 1415) Resp:  [16-24] 16 (02/23 1415) BP: (110-139)/(54-80) 110/68 (02/23 1415) SpO2:  [96 %-97 %] 96 % (02/23 1415) Last BM Date : 03/20/21  Physical Exam General:   Alert and oriented, pleasant Head:  Normocephalic and atraumatic. Eyes:  No icterus, sclera clear. Neck:  Supple, without thyromegaly or masses.  Heart:  S1, S2 present, no murmurs noted.  Lungs: Clear to auscultation bilaterally, without wheezing, rales, or rhonchi.  Abdomen:  Bowel sounds present, soft, mild tenderness to LLQ, non-distended. No HSM or hernias noted. No rebound or guarding. No masses appreciated  Msk:  Symmetrical without gross deformities. Normal posture. Pulses:  Normal pulses noted. Extremities:  Without clubbing or edema. Neurologic:  Alert and  oriented x4;  grossly normal neurologically. Psych:  Alert and cooperative. Normal mood and affect.  Intake/Output from previous day: 02/22 0701 - 02/23 0700 In: 301 [Blood:301] Out: -  Intake/Output this shift: Total I/O In: 560 [P.O.:560] Out: -   Lab Results  Recent Labs    03/18/21 0515 03/19/21 0927 03/19/21 1538 03/20/21 0536 03/20/21 1414  WBC 11.6*  --   --  10.9*  --   HGB 10.4*   < > 7.7* 9.2* 10.0*  HCT 32.9*   < > 24.4* 29.2* 30.5*  PLT 344  --   --  281  --    < > = values in this interval not displayed.   BMET Recent  Labs    03/18/21 0515 03/20/21 0536  NA 137 138  K 4.0 3.7  CL 103 100  CO2 29 29  GLUCOSE 91 96  BUN 22 15  CREATININE 1.26* 1.34*  CALCIUM 8.2* 8.2*   LFT Recent Labs    03/20/21 0536  ALBUMIN 3.0*   PT/INR No results for input(s): LABPROT, INR in the last 72 hours.  Hepatitis Panel No results for input(s): HEPBSAG, HCVAB, HEPAIGM, HEPBIGM in the last 72 hours.   Studies/Results NM GI Blood Loss  Result Date: 03/19/2021 CLINICAL DATA:  GI bleeding for several days EXAM: NUCLEAR MEDICINE GASTROINTESTINAL BLEEDING SCAN TECHNIQUE: Sequential abdominal images were obtained following intravenous administration of Tc-90m labeled red blood cells. RADIOPHARMACEUTICALS:  24 mCi Tc-5m pertechnetate in-vitro labeled red cells. COMPARISON:  None. FINDINGS: Adequate uptake is noted throughout the cardiac blood pool, liver and spleen. Vascular structures are noted. Pooling in the urinary bladder is seen. No focal area of increased activity is identified to suggest active GI hemorrhage. IMPRESSION: No findings to suggest active GI hemorrhage. Electronically Signed   By: Inez Catalina M.D.   On: 03/19/2021 22:43   CT ANGIO GI BLEED  Result Date: 03/17/2021 CLINICAL DATA:  A 71 year old presents with rectal bleeding. EXAM: CTA ABDOMEN AND PELVIS WITHOUT AND WITH CONTRAST TECHNIQUE: Multidetector CT imaging of the abdomen and pelvis was performed using the standard protocol during bolus administration of intravenous contrast. Multiplanar reconstructed images  and MIPs were obtained and reviewed to evaluate the vascular anatomy. RADIATION DOSE REDUCTION: This exam was performed according to the departmental dose-optimization program which includes automated exposure control, adjustment of the mA and/or kV according to patient size and/or use of iterative reconstruction technique. CONTRAST:  22mL OMNIPAQUE IOHEXOL 350 MG/ML SOLN COMPARISON:  None FINDINGS: VASCULAR Aorta: Calcified and noncalcified  atheromatous plaque throughout the abdominal aorta. Maximal caliber of the infrarenal abdominal aorta is 2.5 cm, nonaneurysmal. No signs of stranding adjacent to the abdominal aorta. Celiac: Calcific and noncalcified plaque near the origin without substantial narrowing. No signs of stranding or dissection. No aneurysmal dilation. SMA: Widely patent without signs of dissection or aneurysm. Atheromatous plaque near the origin of the celiac as well. Renals: Single renal arteries bilaterally which are patent. IMA: Patent without evidence of aneurysm, dissection, vasculitis or significant stenosis. Inflow: Patent without evidence of aneurysm, dissection, vasculitis or significant stenosis. Proximal Outflow: Bilateral common femoral and visualized portions of the superficial and profunda femoral arteries are patent without evidence of aneurysm, dissection, vasculitis or significant stenosis. Veins: No obvious venous abnormality within the limitations of this arterial phase study. Review of the MIP images confirms the above findings. NON-VASCULAR Lower chest: No effusion.  No consolidation. Hepatobiliary: No focal liver abnormality is seen. No gallstones, gallbladder wall thickening, or biliary dilatation. Pancreas: Normal, without mass, inflammation or ductal dilatation. Spleen: Normal. Adrenals/Urinary Tract: Mild cortical scarring and parenchymal loss of the bilateral kidneys without signs of hydronephrosis. No perivesical stranding. No nephrolithiasis or suspicious renal lesion. Potential subtle striated nephrogram bilaterally. Stomach/Bowel: Mild gastric thickening. No visible areas to suggest active extravasation in the gastrointestinal tract. While no definite active extravasation is seen in the GI tract there is a prominent vessel that enters the stomach (image 30/11) this is near the mid body along the greater curvature and is also seen along that proximal body at the junction of body and fundus on the coronal  images image 36/13. This does not appear to change in there is no high density material in the lumen currently to suggest active bleeding in this area. Moderate diverticular disease and diverticulosis of the sigmoid. No surrounding stranding. No perienteric stranding. Lymphatic: No adenopathy in the abdomen or in the pelvis. Reproductive: Unremarkable. Other: No pneumatosis.  No free air.  No ascites. Musculoskeletal: No acute or significant osseous findings. IMPRESSION: 1. Prominent vessel that appears to penetrate the wall of the stomach near the fundal/body junction along the greater curvature suspicious for Dieulafoy's lesion, a dilated vessel that can lead to gastrointestinal bleeding, but without definitive signs of active extravasation at this time. This may be incidental or could be related to current presentation. Suggest follow-up endoscopy for further evaluation. 2. Signs of sigmoid diverticular disease without visible signs of extravasation. Nephrogram could be related to scarring but would correlate with any clinical or laboratory evidence of urinary tract infection/pyelonephritis. 3. Subtle striated 4. Aortic atherosclerosis. Aortic Atherosclerosis (ICD10-I70.0). Electronically Signed   By: Zetta Bills M.D.   On: 03/17/2021 13:24    Assessment  71 y.o. female with a history of chronic anemia (unclear baseline hemoglobin), hyperlipidemia, HTN, kidney stones, chronic NSAID use, and arthritis.  She was brought in for rectal bleeding by EMS on 2/20.  On presentation her Hgb was 11.5 which then dropped to 9.4.  She had no evidence of active extravasation on CT angio.  GI was consulted for lower GI bleed.   GI bleed: Rectal bleeding began the morning of 2/20 of  which it was reported that she had a large clot and then a little blood.  CT abdomen pelvis showed prominent vessel along the greater curvature of the stomach suspicious for Dieulafoy's lesion, with no evidence of extravasation, and  diverticular disease in the sigmoid colon.  She was given 1 unit of PRBCs in the ED. after she began her prep she continued to have rectal bleeding with some darker red stool at first, then transitioned to only blood throughout the night and into the morning.  She underwent EGD and colonoscopy on 2/21.  EGD findings with non-obstructing Schatzki ring, gastritis, and duodenitis.  Colonoscopy findings with blood in the entire examined colon, one 4 mm polyp in the ascending colon, diverticulosis in the sigmoid colon.  She continued to bleed so she underwent Givens capsule study on 2/22 with findings of small clot noted at the GE junction secondary to recent intervention without stigmata of active bleeding, moderate bulbar and postbulbar duodenitis without stigmata of bleed, clot with fresh and burgundy blood in the cecum and proximal colon, without any bleeding lesions identified.  Her hemoglobin continued to drop to 7.7 from 10.4 and 8.2 respectively and she was given an additional unit of PRBCs. Since Givens capsule was negative, nuclear GI bleed scan completed with no findings of active GI hemorrhage.   This morning her hemoglobin is 9.2, she has received 2 units PRBCs total during her hospitalization and she had no bleeding events overnight but did have 1 dark red episode this morning with clots. Recommended to repeat colonoscopy in 3 months for screening purposes. With nuclear GI bleed scan negative and responsive Hgb (9.2 then 10), she could be discharged home and she should continue twice daily PPI and recheck H/H in 1 week outpatient, and avoid all NSAIDs. There was an extensive conversation about the risk of her re-bleeding, when to return to the ER, and importance of her PPI and avoiding all NSAID containing products.  Chronic Anemia: Hgb baseline unclear. Has received 2u PRBC inpatient. Will recheck H/H in 1 week outpatient.     Plan / Recommendations   H/H in one week outpatient.  Continue  pantoprazole 40 mg BID.  Avoid all NSAID containing products. Repeat EGD and colonoscopy in 6 weeks.     LOS: 1 day    03/20/2021, 4:02 PM   Venetia Night, MSN, FNP-BC, AGACNP-BC [facility]

## 2021-03-20 NOTE — Progress Notes (Signed)
**Note De-identified Rhonda Briggs Obfuscation** EKG complete and placed in patient chart 

## 2021-03-20 NOTE — Telephone Encounter (Signed)
Noted. Added to list to call once we receive April schedule

## 2021-03-21 ENCOUNTER — Other Ambulatory Visit: Payer: Self-pay | Admitting: *Deleted

## 2021-03-21 DIAGNOSIS — K922 Gastrointestinal hemorrhage, unspecified: Secondary | ICD-10-CM

## 2021-03-21 LAB — SURGICAL PATHOLOGY

## 2021-03-21 NOTE — Telephone Encounter (Signed)
Noted  

## 2021-03-21 NOTE — Telephone Encounter (Signed)
Noted labs entered into Epic

## 2021-03-24 ENCOUNTER — Other Ambulatory Visit (INDEPENDENT_AMBULATORY_CARE_PROVIDER_SITE_OTHER): Payer: Self-pay | Admitting: Gastroenterology

## 2021-03-24 ENCOUNTER — Telehealth: Payer: Self-pay | Admitting: *Deleted

## 2021-03-24 DIAGNOSIS — B9681 Helicobacter pylori [H. pylori] as the cause of diseases classified elsewhere: Secondary | ICD-10-CM

## 2021-03-24 MED ORDER — TETRACYCLINE HCL 500 MG PO CAPS
500.0000 mg | ORAL_CAPSULE | Freq: Four times a day (QID) | ORAL | 0 refills | Status: AC
Start: 2021-03-24 — End: 2021-04-07

## 2021-03-24 MED ORDER — BISMUTH 262 MG PO CHEW: 2.0000 | CHEWABLE_TABLET | Freq: Four times a day (QID) | ORAL | 0 refills | Status: DC

## 2021-03-24 MED ORDER — METRONIDAZOLE 500 MG PO TABS
500.0000 mg | ORAL_TABLET | Freq: Three times a day (TID) | ORAL | 0 refills | Status: AC
Start: 2021-03-24 — End: 2021-04-07

## 2021-03-24 NOTE — Telephone Encounter (Signed)
Pt wants to know when can she have her labs done. [phone]

## 2021-03-24 NOTE — Telephone Encounter (Signed)
Spoke to pt, informed her to have labs done anytime this week.

## 2021-04-02 ENCOUNTER — Telehealth (INDEPENDENT_AMBULATORY_CARE_PROVIDER_SITE_OTHER): Payer: Self-pay | Admitting: *Deleted

## 2021-04-02 NOTE — Telephone Encounter (Signed)
Lmom for pt to return my call.  

## 2021-04-02 NOTE — Telephone Encounter (Signed)
Patient called and wanted to let Dr. Abbey Chatters know that Dr. Jenetta Downer had given her 2 antibiotics for her infection in her gut. ? ?Every time she take this she has to go to the bathroom and has 2 small bowel movements--for last week she has done this and now she has a hemorrhoids and it wants to bleed. ? ?She is stopping take this medication and wanted him to know this. ? ?Also causes her to have an yeast infection too. ? ? ?

## 2021-04-02 NOTE — Telephone Encounter (Signed)
Spoke with patient and she informed me that she only has the rest of this week left that she will finish the medications out. Pt states that she has preparation h for the hemorrhoids and that she has medication for the yeast infection as well. Pt will call if symptoms get worse.  ?

## 2021-04-07 ENCOUNTER — Other Ambulatory Visit: Payer: Self-pay | Admitting: *Deleted

## 2021-04-07 DIAGNOSIS — D649 Anemia, unspecified: Secondary | ICD-10-CM

## 2021-04-07 DIAGNOSIS — K922 Gastrointestinal hemorrhage, unspecified: Secondary | ICD-10-CM

## 2021-04-07 NOTE — Telephone Encounter (Signed)
Received patient's lab results from PCP office and have reviewed them. ? ?Hemoglobin 10 point discharge on 03/20/2021.  Follow-up hemoglobin in 1 week on 03/28/2021 was 9.1.  Lab was repeated on 04/02/2021 revealed hemoglobin 9.3. ? ?Baseline hemoglobin is unclear as patient has a history of anemia.  Recommend at least 1 more repeat CBC in 2 weeks to ensure hemoglobin is stable. ? ?Venetia Night, MSN, FNP-BC, AGACNP-BC ?[facility] ? ? ? ? ?

## 2021-04-07 NOTE — Telephone Encounter (Signed)
Spoke to pt, informed her to have labs drawn in 2 weeks. Pt voiced understanding. Labs entered into Epic  ?

## 2021-04-07 NOTE — Progress Notes (Signed)
error 

## 2021-04-10 ENCOUNTER — Telehealth: Payer: Self-pay

## 2021-04-10 NOTE — Telephone Encounter (Addendum)
Spoke to pt, she would like to wait until May to have procedure. She has some other appts in April. She is aware we will call back to schedule procedure when May schedule is available. ?

## 2021-04-10 NOTE — Telephone Encounter (Signed)
Tried to call pt to schedule TCS/EGD ASA 3 w/Dr. Abbey Chatters, [facility] for return call. ?

## 2021-04-15 ENCOUNTER — Inpatient Hospital Stay: Payer: Medicare HMO | Admitting: Gastroenterology

## 2021-04-28 ENCOUNTER — Other Ambulatory Visit: Payer: Self-pay

## 2021-04-28 MED ORDER — PEG 3350-KCL-NA BICARB-NACL 420 G PO SOLR: 4000.0000 mL | ORAL | 0 refills | Status: DC

## 2021-04-28 NOTE — Telephone Encounter (Signed)
Called pt, TCS/EGD scheduled for 06/06/21 at 2:00pm. Rx for prep sent to pharmacy. Orders entered. ?

## 2021-04-29 NOTE — Telephone Encounter (Signed)
Pre-op appt 06/04/21. Appt letter mailed with procedure instructions. ?

## 2021-05-15 LAB — T3, FREE: T3, Free: 2.6 pg/mL (ref 2.0–4.4)

## 2021-05-15 LAB — T4, FREE: Free T4: 1.4 ng/dL (ref 0.82–1.77)

## 2021-05-15 LAB — TSH: TSH: 0.997 u[IU]/mL (ref 0.450–4.500)

## 2021-05-19 ENCOUNTER — Encounter: Payer: Self-pay | Admitting: "Endocrinology

## 2021-05-19 ENCOUNTER — Ambulatory Visit: Payer: Medicare HMO | Admitting: "Endocrinology

## 2021-05-19 VITALS — BP 120/82 | HR 84 | Ht 60.0 in | Wt 162.8 lb

## 2021-05-19 DIAGNOSIS — E049 Nontoxic goiter, unspecified: Secondary | ICD-10-CM | POA: Diagnosis not present

## 2021-05-19 NOTE — Progress Notes (Signed)
? ?    ?                                           05/19/2021, 5:04 PM ? ? ?Endocrinology follow-up note ? ?Subjective:  ? ? Patient ID: Rhonda Briggs, female    DOB: Aug 05, 1950, PCP Danna Hefty, DO ? ? ?Past Medical History:  ?Diagnosis Date  ? Anemia   ? Arthritis   ? History of kidney stones   ? Hyperlipidemia   ? Hypertension   ? Osteoporosis   ? PONV (postoperative nausea and vomiting)   ? after hernia surgery  ? ?Past Surgical History:  ?Procedure Laterality Date  ? BIOPSY  03/18/2021  ? Procedure: BIOPSY;  Surgeon: Harvel Quale, MD;  Location: [facility];  Service: Gastroenterology;;  ? CARPAL TUNNEL RELEASE Right 2011  ? COLONOSCOPY WITH PROPOFOL N/A 03/18/2021  ? Procedure: COLONOSCOPY WITH PROPOFOL;  Surgeon: Harvel Quale, MD;  Location: [facility];  Service: Gastroenterology;  Laterality: N/A;  ? ESOPHAGOGASTRODUODENOSCOPY (EGD) WITH PROPOFOL N/A 03/18/2021  ? Procedure: ESOPHAGOGASTRODUODENOSCOPY (EGD) WITH PROPOFOL;  Surgeon: Harvel Quale, MD;  Location: [facility];  Service: Gastroenterology;  Laterality: N/A;  ? GIVENS CAPSULE STUDY N/A 03/19/2021  ? Procedure: GIVENS CAPSULE STUDY;  Surgeon: Rogene Houston, MD;  Location: [facility];  Service: Endoscopy;  Laterality: N/A;  ? HERNIA REPAIR Left   ? inguinal  ? TOTAL KNEE ARTHROPLASTY Right 08/19/2016  ? Procedure: TOTAL KNEE ARTHROPLASTY;  Surgeon: Carole Civil, MD;  Location: [facility];  Service: Orthopedics;  Laterality: Right;  ? TOTAL KNEE ARTHROPLASTY Left 12/31/2016  ? Procedure: TOTAL KNEE ARTHROPLASTY;  Surgeon: Carole Civil, MD;  Location: [facility];  Service: Orthopedics;  Laterality: Left;  ? TUBAL LIGATION    ? ?Social History  ? ?Socioeconomic History  ? Marital status: Widowed  ?  Spouse name: Not on file  ? Number of children: Not on file  ? Years of education: Not on file  ? Highest education level: Not on file  ?Occupational History  ? Not on file  ?Tobacco Use  ? Smoking  status: Every Day  ?  Packs/day: 0.50  ?  Years: 48.00  ?  Pack years: 24.00  ?  Types: Cigarettes  ? Smokeless tobacco: Never  ? Tobacco comments:  ?  smokes 10 cigarettes per day 03/05/2020  ?Vaping Use  ? Vaping Use: Never used  ?Substance and Sexual Activity  ? Alcohol use: No  ? Drug use: No  ? Sexual activity: Yes  ?  Birth control/protection: Surgical  ?Other Topics Concern  ? Not on file  ?Social History Narrative  ? Not on file  ? ?Social Determinants of Health  ? ?Financial Resource Strain: Not on file  ?Food Insecurity: Not on file  ?Transportation Needs: Not on file  ?Physical Activity: Not on file  ?Stress: Not on file  ?Social Connections: Not on file  ? ?Family History  ?Problem Relation Age of Onset  ? Hypertension Mother   ? Lung disease Mother   ? Cancer Father   ? Urticaria Neg Hx   ? Eczema Neg Hx   ? Immunodeficiency Neg Hx   ? Atopy Neg Hx   ? Asthma Neg Hx   ? Angioedema Neg Hx   ? Allergic rhinitis Neg Hx   ? ?Outpatient Encounter Medications  as of 05/19/2021  ?Medication Sig  ? Bismuth 262 MG CHEW Chew 2 each by mouth every 6 (six) hours.  ? budesonide-formoterol (SYMBICORT) 160-4.5 MCG/ACT inhaler 2 puffs  ? Calcium Carb-Cholecalciferol (OYSTER SHELL CALCIUM W/D) 500-200 MG-UNIT TABS Take 1 tablet by mouth 2 (two) times daily.  ? calcium gluconate 500 MG tablet Take 1 tablet by mouth 2 (two) times daily.  ? cholecalciferol (VITAMIN D) 25 MCG (1000 UNIT) tablet Take 1,000 Units by mouth daily. Takes 2 a day  ? folic acid (FOLVITE) 1 MG tablet Take 1 tablet (1 mg total) by mouth daily.  ? gabapentin (NEURONTIN) 100 MG capsule Take 100 mg by mouth in the morning, at noon, and at bedtime.  ? INCRUSE ELLIPTA 62.5 MCG/ACT AEPB Inhale 1 puff into the lungs daily.  ? methotrexate (RHEUMATREX) 2.5 MG tablet Take 5 tablets by mouth once a week.  ? Multiple Vitamins-Minerals (CENTRUM SILVER 50+WOMEN PO) Take 1 tablet by mouth daily in the afternoon.  ? neomycin-polymyxin b-dexamethasone (MAXITROL)  3.5-10000-0.1 OINT Place 1 application into the right eye at bedtime.  ? nicotine (NICODERM CQ - DOSED IN MG/24 HOURS) 14 mg/24hr patch Place 1 patch (14 mg total) onto the skin daily.  ? pantoprazole (PROTONIX) 40 MG tablet Take 1 tablet (40 mg total) by mouth 2 (two) times daily.  ? polyethylene glycol-electrolytes (TRILYTE) 420 g solution Take 4,000 mLs by mouth as directed.  ? pramipexole (MIRAPEX) 0.125 MG tablet Take 0.25 mg by mouth 2 (two) times daily as needed (restless leg).  ? pravastatin (PRAVACHOL) 20 MG tablet Take 20 mg by mouth daily.  ? tobramycin-dexamethasone (TOBRADEX) ophthalmic solution Place 1 drop into the right eye every 6 (six) hours.  ? VENTOLIN HFA 108 (90 Base) MCG/ACT inhaler Inhale 2 puffs into the lungs every 6 (six) hours as needed for wheezing or shortness of breath.  ? ?No facility-administered encounter medications on file as of 05/19/2021.  ? ?ALLERGIES: ?Allergies  ?Allergen Reactions  ? Amoxicillin Nausea And Vomiting  ? ? ?VACCINATION STATUS: ?Immunization History  ?Administered Date(s) Administered  ? Fluad Quad(high Dose 65+) 11/16/2019  ? Moderna Sars-Covid-2 Vaccination 02/17/2019, 03/15/2019  ? Pneumococcal Polysaccharide-23 11/16/2019  ? ? ?HPI ?Rhonda Briggs is 71 y.o. female who presents today with a medical history as above. she is being seen in follow-up with repeat thyroid function tests after this patient was seen for nodular goiter.  Prior to her last visit, she underwent fine-needle aspiration of a thyroid nodule with benign findings. ?PCP:  Danna Hefty, DO. ?See notes from the previous visit. ? 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  3.2 cm suspicious nodule from the left lobe was biopsied on October 09, 2020.  Biopsy findings were consistent with atypia of undetermined significance.  A sample was sent for Afirma molecular testing which returned approximately 4% risk of malignancy.  This is considered benign finding.   She does not have thyroid function test to review.  She remains a chronic smoker.  She does not have acute complaints today.  She denies family history of thyroid malignancy.   ? ? ?She reports cough, wheezing, and dyspnea on exertion.  She denies dysphagia. ? ?Her previsit thyroid function tests are consistent with euthyroid state.   She also gives history of prediabetes in the past.  She is not on any intervention. ? ? ?Review of Systems ? ?Constitutional: + Minimally fluctuating body weight, +fatigue, no subjective hyperthermia, no subjective hypothermia ? ? ?  Objective:  ?  ? ?  05/19/2021  ?  2:09 PM 03/20/2021  ?  2:15 PM 03/20/2021  ?  5:48 AM  ?Vitals with BMI  ?Height '5\' 0"'$     ?Weight 162 lbs 13 oz    ?BMI 31.79    ?Systolic 017 510 258  ?Diastolic 82 68 64  ?Pulse 84 88 91  ? ? ?BP 120/82   Pulse 84   Ht 5' (1.524 m)   Wt 162 lb 12.8 oz (73.8 kg)   BMI 31.79 kg/m?   ?Wt Readings from Last 3 Encounters:  ?05/19/21 162 lb 12.8 oz (73.8 kg)  ?03/18/21 164 lb (74.4 kg)  ?02/07/21 163 lb (73.9 kg)  ?  ?Physical Exam ? ?Constitutional:  Body mass index is 31.79 kg/m?.,  not in acute distress, normal state of mind ?Eyes: PERRLA, EOMI, no exophthalmos ?ENT: moist mucous membranes, no gross thyromegaly, no gross cervical lymphadenopathy ? ? ?CMP ( most recent) ?CMP  ?   ?Component Value Date/Time  ? NA 138 03/20/2021 0536  ? K 3.7 03/20/2021 0536  ? CL 100 03/20/2021 0536  ? CO2 29 03/20/2021 0536  ? GLUCOSE 96 03/20/2021 0536  ? BUN 15 03/20/2021 0536  ? CREATININE 1.34 (H) 03/20/2021 0536  ? CALCIUM 8.2 (L) 03/20/2021 0536  ? PROT 5.8 (L) 03/17/2021 1000  ? ALBUMIN 3.0 (L) 03/20/2021 0536  ? AST 14 (L) 03/17/2021 1000  ? ALT 14 03/17/2021 1000  ? ALKPHOS 47 03/17/2021 1000  ? BILITOT 0.6 03/17/2021 1000  ? GFRNONAA 43 (L) 03/20/2021 0536  ? GFRAA 60 (L) 01/01/2017 0427  ? ? Latest Reference Range & Units 11/11/20 14:15 05/14/21 13:49  ?TSH 0.450 - 4.500 uIU/mL 0.825 0.997  ?Triiodothyronine,Free,Serum 2.0 - 4.4  pg/mL 3.0 2.6  ?T4,Free(Direct) 0.82 - 1.77 ng/dL 1.78 (H) 1.40  ?Thyroperoxidase Ab SerPl-aCnc 0 - 34 IU/mL 10   ?Thyroglobulin Antibody 0.0 - 0.9 IU/mL <1.0   ?(H): Data is abnormally high ? ?Thyroid ultrasou

## 2021-06-03 NOTE — Patient Instructions (Signed)
? ? ? ? ? ? ? ? Rhonda Briggs ? 06/03/2021  ?  ? '@PREFPERIOPPHARMACY'$ @ ? ? Your procedure is scheduled on  06/06/2021. ? ? Report to [facility] at  1200  P.M. ? ? Call this number if you have problems the morning of surgery: ? [phone] ? ? Remember: ? Follow the diet and prep instructions given to you by the office. ? ?  Use your inhalers before you come and bring your rescue inhaler with you. ?  ? Take these medicines the morning of surgery with A SIP OF WATER  ? ?                       gabapentin, protonix, prednisone. ?  ? ? Do not wear jewelry, make-up or nail polish. ? Do not wear lotions, powders, or perfumes, or deodorant. ? Do not shave 48 hours prior to surgery.  Men may shave face and neck. ? Do not bring valuables to the hospital. ? [facility] is not responsible for any belongings or valuables. ? ?Contacts, dentures or bridgework may not be worn into surgery.  Leave your suitcase in the car.  After surgery it may be brought to your room. ? ?For patients admitted to the hospital, discharge time will be determined by your treatment team. ? ?Patients discharged the day of surgery will not be allowed to drive home and must have someone with them for 24 hours.  ? ? ?Special instructions:   DO NOT smoke tobacco or vape for 24 hours before your procedure. ? ?Please read over the following fact sheets that you were given. ?Anesthesia Post-op Instructions and Care and Recovery After Surgery ?  ? ? ? Upper Endoscopy, Adult, Care After ?This sheet gives you information about how to care for yourself after your procedure. Your health care provider may also give you more specific instructions. If you have problems or questions, contact your health care provider. ?What can I expect after the procedure? ?After the procedure, it is common to have: ?A sore throat. ?Mild stomach pain or discomfort. ?Bloating. ?Nausea. ?Follow these instructions at home: ? ?Follow instructions from your health care provider about what to  eat or drink after your procedure. ?Return to your normal activities as told by your health care provider. Ask your health care provider what activities are safe for you. ?Take over-the-counter and prescription medicines only as told by your health care provider. ?If you were given a sedative during the procedure, it can affect you for several hours. Do not drive or operate machinery until your health care provider says that it is safe. ?Keep all follow-up visits as told by your health care provider. This is important. ?Contact a health care provider if you have: ?A sore throat that lasts longer than one day. ?Trouble swallowing. ?Get help right away if: ?You vomit blood or your vomit looks like coffee grounds. ?You have: ?A fever. ?Bloody, black, or tarry stools. ?A severe sore throat or you cannot swallow. ?Difficulty breathing. ?Severe pain in your chest or abdomen. ?Summary ?After the procedure, it is common to have a sore throat, mild stomach discomfort, bloating, and nausea. ?If you were given a sedative during the procedure, it can affect you for several hours. Do not drive or operate machinery until your health care provider says that it is safe. ?Follow instructions from your health care provider about what to eat or drink after your procedure. ?Return to your normal activities as told  by your health care provider. ?This information is not intended to replace advice given to you by your health care provider. Make sure you discuss any questions you have with your health care provider. ?Document Revised: 11/18/2018 Document Reviewed: 06/14/2017 ?Elsevier Patient Education ? [place]. ?Colonoscopy, Adult, Care After ?The following information offers guidance on how to care for yourself after your procedure. Your health care provider may also give you more specific instructions. If you have problems or questions, contact your health care provider. ?What can I expect after the procedure? ?After the  procedure, it is common to have: ?A small amount of blood in your stool for 24 hours after the procedure. ?Some gas. ?Mild cramping or bloating of your abdomen. ?Follow these instructions at home: ?Eating and drinking ? ?Drink enough fluid to keep your urine pale yellow. ?Follow instructions from your health care provider about eating or drinking restrictions. ?Resume your normal diet as told by your health care provider. Avoid heavy or fried foods that are hard to digest. ?Activity ?Rest as told by your health care provider. ?Avoid sitting for a long time without moving. Get up to take short walks every 1-2 hours. This is important to improve blood flow and breathing. Ask for help if you feel weak or unsteady. ?Return to your normal activities as told by your health care provider. Ask your health care provider what activities are safe for you. ?Managing cramping and bloating ? ?Try walking around when you have cramps or feel bloated. ?If directed, apply heat to your abdomen as told by your health care provider. Use the heat source that your health care provider recommends, such as a moist heat pack or a heating pad. ?Place a towel between your skin and the heat source. ?Leave the heat on for 20-30 minutes. ?Remove the heat if your skin turns bright red. This is especially important if you are unable to feel pain, heat, or cold. You have a greater risk of getting burned. ?General instructions ?If you were given a sedative during the procedure, it can affect you for several hours. Do not drive or operate machinery until your health care provider says that it is safe. ?For the first 24 hours after the procedure: ?Do not sign important documents. ?Do not drink alcohol. ?Do your regular daily activities at a slower pace than normal. ?Eat soft foods that are easy to digest. ?Take over-the-counter and prescription medicines only as told by your health care provider. ?Keep all follow-up visits. This is important. ?Contact  a health care provider if: ?You have blood in your stool 2-3 days after the procedure. ?Get help right away if: ?You have more than a small spotting of blood in your stool. ?You have large blood clots in your stool. ?You have swelling of your abdomen. ?You have nausea or vomiting. ?You have a fever. ?You have increasing pain in your abdomen that is not relieved with medicine. ?These symptoms may be an emergency. Get help right away. Call 911. ?Do not wait to see if the symptoms will go away. ?Do not drive yourself to the hospital. ?Summary ?After the procedure, it is common to have a small amount of blood in your stool. You may also have mild cramping and bloating of your abdomen. ?If you were given a sedative during the procedure, it can affect you for several hours. Do not drive or operate machinery until your health care provider says that it is safe. ?Get help right away if you have a  lot of blood in your stool, nausea or vomiting, a fever, or increased pain in your abdomen. ?This information is not intended to replace advice given to you by your health care provider. Make sure you discuss any questions you have with your health care provider. ?Document Revised: 09/04/2020 Document Reviewed: 09/04/2020 ?Elsevier Patient Education ? [place]. ?Monitored Anesthesia Care, Care After ?This sheet gives you information about how to care for yourself after your procedure. Your health care provider may also give you more specific instructions. If you have problems or questions, contact your health care provider. ?What can I expect after the procedure? ?After the procedure, it is common to have: ?Tiredness. ?Forgetfulness about what happened after the procedure. ?Impaired judgment for important decisions. ?Nausea or vomiting. ?Some difficulty with balance. ?Follow these instructions at home: ?For the time period you were told by your health care provider: ? ?  ? ?Rest as needed. ?Do not participate in activities  where you could fall or become injured. ?Do not drive or use machinery. ?Do not drink alcohol. ?Do not take sleeping pills or medicines that cause drowsiness. ?Do not make important decisions or sign leg

## 2021-06-04 ENCOUNTER — Encounter (HOSPITAL_COMMUNITY): Payer: Self-pay

## 2021-06-04 ENCOUNTER — Encounter (HOSPITAL_COMMUNITY)
Admission: RE | Admit: 2021-06-04 | Discharge: 2021-06-04 | Disposition: A | Discharge status: Home / Self Care | Payer: Medicare HMO | Source: Ambulatory Visit | Attending: Internal Medicine | Admitting: Internal Medicine

## 2021-06-04 VITALS — BP 129/62 | HR 98 | Temp 97.5°F | Resp 18 | Ht 60.0 in | Wt 162.8 lb

## 2021-06-04 DIAGNOSIS — D500 Iron deficiency anemia secondary to blood loss (chronic): Secondary | ICD-10-CM | POA: Diagnosis not present

## 2021-06-04 DIAGNOSIS — Z01812 Encounter for preprocedural laboratory examination: Secondary | ICD-10-CM | POA: Diagnosis present

## 2021-06-04 DIAGNOSIS — N1832 Chronic kidney disease, stage 3b: Secondary | ICD-10-CM | POA: Insufficient documentation

## 2021-06-04 DIAGNOSIS — R7303 Prediabetes: Secondary | ICD-10-CM | POA: Diagnosis not present

## 2021-06-04 HISTORY — DX: Chronic obstructive pulmonary disease, unspecified: J44.9

## 2021-06-04 HISTORY — DX: Unspecified asthma, uncomplicated: J45.909

## 2021-06-04 LAB — CBC WITH DIFFERENTIAL/PLATELET
Abs Immature Granulocytes: 0.1 10*3/uL — ABNORMAL HIGH (ref 0.00–0.07)
Basophils Absolute: 0.1 10*3/uL (ref 0.0–0.1)
Basophils Relative: 0 %
Eosinophils Absolute: 0.3 10*3/uL (ref 0.0–0.5)
Eosinophils Relative: 2 %
HCT: 39.8 % (ref 36.0–46.0)
Hemoglobin: 12.2 g/dL (ref 12.0–15.0)
Immature Granulocytes: 1 %
Lymphocytes Relative: 11 %
Lymphs Abs: 1.8 10*3/uL (ref 0.7–4.0)
MCH: 25.7 pg — ABNORMAL LOW (ref 26.0–34.0)
MCHC: 30.7 g/dL (ref 30.0–36.0)
MCV: 83.8 fL (ref 80.0–100.0)
Monocytes Absolute: 0.8 10*3/uL (ref 0.1–1.0)
Monocytes Relative: 5 %
Neutro Abs: 13.1 10*3/uL — ABNORMAL HIGH (ref 1.7–7.7)
Neutrophils Relative %: 81 %
Platelets: 507 10*3/uL — ABNORMAL HIGH (ref 150–400)
RBC: 4.75 MIL/uL (ref 3.87–5.11)
RDW: 14.5 % (ref 11.5–15.5)
WBC: 16.1 10*3/uL — ABNORMAL HIGH (ref 4.0–10.5)
nRBC: 0 % (ref 0.0–0.2)

## 2021-06-04 LAB — BASIC METABOLIC PANEL
Anion gap: 9 (ref 5–15)
BUN: 20 mg/dL (ref 8–23)
CO2: 26 mmol/L (ref 22–32)
Calcium: 9 mg/dL (ref 8.9–10.3)
Chloride: 102 mmol/L (ref 98–111)
Creatinine, Ser: 1.14 mg/dL — ABNORMAL HIGH (ref 0.44–1.00)
GFR, Estimated: 52 mL/min — ABNORMAL LOW (ref >60)
Glucose, Bld: 112 mg/dL — ABNORMAL HIGH (ref 70–99)
Potassium: 4.4 mmol/L (ref 3.5–5.1)
Sodium: 137 mmol/L (ref 135–145)

## 2021-06-06 ENCOUNTER — Encounter (HOSPITAL_COMMUNITY): Payer: Self-pay

## 2021-06-06 ENCOUNTER — Other Ambulatory Visit: Payer: Self-pay

## 2021-06-06 ENCOUNTER — Encounter (HOSPITAL_COMMUNITY)
Admission: RE | Disposition: A | Discharge status: Home Health | Payer: Self-pay | Source: Home / Self Care | Attending: Internal Medicine

## 2021-06-06 ENCOUNTER — Ambulatory Visit (HOSPITAL_COMMUNITY)
Admission: RE | Admit: 2021-06-06 | Discharge: 2021-06-06 | Disposition: A | Discharge status: Home Health | Payer: Medicare HMO | Attending: Internal Medicine | Admitting: Internal Medicine

## 2021-06-06 ENCOUNTER — Ambulatory Visit (HOSPITAL_COMMUNITY): Payer: Medicare HMO | Admitting: Anesthesiology

## 2021-06-06 ENCOUNTER — Ambulatory Visit (HOSPITAL_BASED_OUTPATIENT_CLINIC_OR_DEPARTMENT_OTHER): Payer: Medicare HMO | Admitting: Anesthesiology

## 2021-06-06 DIAGNOSIS — K5731 Diverticulosis of large intestine without perforation or abscess with bleeding: Secondary | ICD-10-CM

## 2021-06-06 DIAGNOSIS — K449 Diaphragmatic hernia without obstruction or gangrene: Secondary | ICD-10-CM

## 2021-06-06 DIAGNOSIS — D649 Anemia, unspecified: Secondary | ICD-10-CM | POA: Insufficient documentation

## 2021-06-06 DIAGNOSIS — K5730 Diverticulosis of large intestine without perforation or abscess without bleeding: Secondary | ICD-10-CM | POA: Diagnosis not present

## 2021-06-06 DIAGNOSIS — K921 Melena: Secondary | ICD-10-CM | POA: Diagnosis not present

## 2021-06-06 DIAGNOSIS — K2970 Gastritis, unspecified, without bleeding: Secondary | ICD-10-CM | POA: Diagnosis not present

## 2021-06-06 DIAGNOSIS — D62 Acute posthemorrhagic anemia: Secondary | ICD-10-CM | POA: Diagnosis not present

## 2021-06-06 DIAGNOSIS — F17210 Nicotine dependence, cigarettes, uncomplicated: Secondary | ICD-10-CM | POA: Insufficient documentation

## 2021-06-06 DIAGNOSIS — K2971 Gastritis, unspecified, with bleeding: Secondary | ICD-10-CM

## 2021-06-06 DIAGNOSIS — I129 Hypertensive chronic kidney disease with stage 1 through stage 4 chronic kidney disease, or unspecified chronic kidney disease: Secondary | ICD-10-CM | POA: Diagnosis not present

## 2021-06-06 DIAGNOSIS — E1122 Type 2 diabetes mellitus with diabetic chronic kidney disease: Secondary | ICD-10-CM | POA: Insufficient documentation

## 2021-06-06 DIAGNOSIS — K222 Esophageal obstruction: Secondary | ICD-10-CM

## 2021-06-06 DIAGNOSIS — D759 Disease of blood and blood-forming organs, unspecified: Secondary | ICD-10-CM | POA: Diagnosis not present

## 2021-06-06 DIAGNOSIS — K648 Other hemorrhoids: Secondary | ICD-10-CM | POA: Insufficient documentation

## 2021-06-06 DIAGNOSIS — N189 Chronic kidney disease, unspecified: Secondary | ICD-10-CM | POA: Insufficient documentation

## 2021-06-06 HISTORY — PX: ESOPHAGOGASTRODUODENOSCOPY (EGD) WITH PROPOFOL: SHX5813

## 2021-06-06 HISTORY — PX: COLONOSCOPY WITH PROPOFOL: SHX5780

## 2021-06-06 SURGERY — COLONOSCOPY WITH PROPOFOL
Anesthesia: General

## 2021-06-06 MED ORDER — PROPOFOL 500 MG/50ML IV EMUL
INTRAVENOUS | Status: DC | PRN
  Administered 2021-06-06: 200 ug/kg/min via INTRAVENOUS

## 2021-06-06 MED ORDER — LIDOCAINE HCL (CARDIAC) PF 100 MG/5ML IV SOSY
PREFILLED_SYRINGE | INTRAVENOUS | Status: DC | PRN
  Administered 2021-06-06: 60 mg via INTRATRACHEAL

## 2021-06-06 MED ORDER — LACTATED RINGERS IV SOLN: INTRAVENOUS | Status: DC | PRN

## 2021-06-06 MED ORDER — PHENYLEPHRINE HCL (PRESSORS) 10 MG/ML IV SOLN
INTRAVENOUS | Status: DC | PRN
  Administered 2021-06-06: 160 ug via INTRAVENOUS
  Administered 2021-06-06: 80 ug via INTRAVENOUS

## 2021-06-06 MED ORDER — PROPOFOL 10 MG/ML IV BOLUS
INTRAVENOUS | Status: DC | PRN
  Administered 2021-06-06 (×4): 50 mg via INTRAVENOUS
  Administered 2021-06-06: 100 mg via INTRAVENOUS

## 2021-06-06 NOTE — Op Note (Signed)
[facility] ?Patient Name: Rhonda Briggs ?Procedure Date: 06/06/2021 12:25 PM ?MRN: [id number] ?Date of Birth: July 10, 1950 ?Attending MD: Elon Alas. Abbey Chatters , DO ?CSN: [id number] ?Age: 71 ?Admit Type: Outpatient ?Procedure:                Upper GI endoscopy ?Indications:              Acute post hemorrhagic anemia, Gastrointestinal  ?                          bleeding of unknown origin ?Providers:                Elon Alas. Abbey Chatters, DO, Lambert Mody, Manuela Schwartz  ?                          Valarie Merino, Technician, Shanon Brow  ?                          Tiney Rouge., Technician ?Referring MD:              ?Medicines:                See the Anesthesia note for documentation of the  ?                          administered medications ?Complications:            No immediate complications. ?Estimated Blood Loss:     Estimated blood loss: none. ?Procedure:                Pre-Anesthesia Assessment: ?                          - The anesthesia plan was to use monitored  ?                          anesthesia care (MAC). ?                          After obtaining informed consent, the endoscope was  ?                          passed under direct vision. Throughout the  ?                          procedure, the patient's blood pressure, pulse, and  ?                          oxygen saturations were monitored continuously. The  ?                          GIF-H190 ([id number]) scope was introduced through the  ?                          mouth, and advanced to the second part of duodenum.  ?                          The upper GI endoscopy was accomplished without  ?  difficulty. The patient tolerated the procedure  ?                          well. ?Scope In: 12:33:26 PM ?Scope Out: 12:36:56 PM ?Total Procedure Duration: 0 hours 3 minutes 30 seconds  ?Findings: ?     A small hiatal hernia was present. ?     A mild Schatzki ring was found in the distal esophagus. ?     Patchy mild inflammation  characterized by erythema was found in the  ?     entire examined stomach. ?     The duodenal bulb, first portion of the duodenum and second portion of  ?     the duodenum were normal. ?Impression:               - Small hiatal hernia. ?                          - Mild Schatzki ring. ?                          - Gastritis. ?                          - Normal duodenal bulb, first portion of the  ?                          duodenum and second portion of the duodenum. ?                          - No specimens collected. ?Moderate Sedation: ?     Per Anesthesia Care ?Recommendation:           - Patient has a contact number available for  ?                          emergencies. The signs and symptoms of potential  ?                          delayed complications were discussed with the  ?                          patient. Return to normal activities tomorrow.  ?                          Written discharge instructions were provided to the  ?                          patient. ?                          - Resume previous diet. ?                          - Continue present medications. ?                          - Proceed with colonoscopy ?Procedure Code(s):        --- Professional --- ?  35456, Esophagogastroduodenoscopy, flexible,  ?                          transoral; diagnostic, including collection of  ?                          specimen(s) by brushing or washing, when performed  ?                          (separate procedure) ?Diagnosis Code(s):        --- Professional --- ?                          K44.9, Diaphragmatic hernia without obstruction or  ?                          gangrene ?                          K22.2, Esophageal obstruction ?                          K29.70, Gastritis, unspecified, without bleeding ?                          D62, Acute posthemorrhagic anemia ?                          K92.2, Gastrointestinal hemorrhage, unspecified ?CPT copyright 2019 American Medical  Association. All rights reserved. ?The codes documented in this report are preliminary and upon coder review may  ?be revised to meet current compliance requirements. ?Elon Alas. Abbey Chatters, DO ?Elon Alas. Weeki [place], DO ?06/06/2021 12:39:24 PM ?This report has been signed electronically. ?Number of Addenda: 0 ?

## 2021-06-06 NOTE — Transfer of Care (Signed)
Immediate Anesthesia Transfer of Care Note ? ?Patient: Rhonda Briggs ? ?Procedure(s) Performed: COLONOSCOPY WITH PROPOFOL ?ESOPHAGOGASTRODUODENOSCOPY (EGD) WITH PROPOFOL ? ?Patient Location: Short Stay ? ?Anesthesia Type:General ? ?Level of Consciousness: sedated ? ?Airway & Oxygen Therapy: Patient Spontanous Breathing ? ?Post-op Assessment: Report given to RN and Post -op Vital signs reviewed and stable ? ?Post vital signs: Reviewed and stable ? ?Last Vitals:  ?Vitals Value Taken Time  ?BP    ?Temp    ?Pulse    ?Resp    ?SpO2    ? ? ?Last Pain:  ?Vitals:  ? 06/06/21 1228  ?TempSrc:   ?PainSc: 0-No pain  ?   ? ?  ? ?Complications: No notable events documented. ?

## 2021-06-06 NOTE — Discharge Instructions (Addendum)
EGD ?Discharge instructions ?Please read the instructions outlined below and refer to this sheet in the next few weeks. These discharge instructions provide you with general information on caring for yourself after you leave the hospital. Your doctor may also give you specific instructions. While your treatment has been planned according to the most current medical practices available, unavoidable complications occasionally occur. If you have any problems or questions after discharge, please call your doctor. ?ACTIVITY ?You may resume your regular activity but move at a slower pace for the next 24 hours.  ?Take frequent rest periods for the next 24 hours.  ?Walking will help expel (get rid of) the air and reduce the bloated feeling in your abdomen.  ?No driving for 24 hours (because of the anesthesia (medicine) used during the test).  ?You may shower.  ?Do not sign any important legal documents or operate any machinery for 24 hours (because of the anesthesia used during the test).  ?NUTRITION ?Drink plenty of fluids.  ?You may resume your normal diet.  ?Begin with a light meal and progress to your normal diet.  ?Avoid alcoholic beverages for 24 hours or as instructed by your caregiver.  ?MEDICATIONS ?You may resume your normal medications unless your caregiver tells you otherwise.  ?WHAT YOU CAN EXPECT TODAY ?You may experience abdominal discomfort such as a feeling of fullness or ?gas? pains.  ?FOLLOW-UP ?Your doctor will discuss the results of your test with you.  ?SEEK IMMEDIATE MEDICAL ATTENTION IF ANY OF THE FOLLOWING OCCUR: ?Excessive nausea (feeling sick to your stomach) and/or vomiting.  ?Severe abdominal pain and distention (swelling).  ?Trouble swallowing.  ?Temperature over 101? F (37.8? C).  ?Rectal bleeding or vomiting of blood.  ? ? ?Colonoscopy ?Discharge Instructions ? ?Read the instructions outlined below and refer to this sheet in the next few weeks. These discharge instructions provide you with  general information on caring for yourself after you leave the hospital. Your doctor may also give you specific instructions. While your treatment has been planned according to the most current medical practices available, unavoidable complications occasionally occur.  ? ?ACTIVITY ?You may resume your regular activity, but move at a slower pace for the next 24 hours.  ?Take frequent rest periods for the next 24 hours.  ?Walking will help get rid of the air and reduce the bloated feeling in your belly (abdomen).  ?No driving for 24 hours (because of the medicine (anesthesia) used during the test).   ?Do not sign any important legal documents or operate any machinery for 24 hours (because of the anesthesia used during the test).  ?NUTRITION ?Drink plenty of fluids.  ?You may resume your normal diet as instructed by your doctor.  ?Begin with a light meal and progress to your normal diet. Heavy or fried foods are harder to digest and may make you feel sick to your stomach (nauseated).  ?Avoid alcoholic beverages for 24 hours or as instructed.  ?MEDICATIONS ?You may resume your normal medications unless your doctor tells you otherwise.  ?WHAT YOU CAN EXPECT TODAY ?Some feelings of bloating in the abdomen.  ?Passage of more gas than usual.  ?Spotting of blood in your stool or on the toilet paper.  ?IF YOU HAD POLYPS REMOVED DURING THE COLONOSCOPY: ?No aspirin products for 7 days or as instructed.  ?No alcohol for 7 days or as instructed.  ?Eat a soft diet for the next 24 hours.  ?FINDING OUT THE RESULTS OF YOUR TEST ?Not all test results are available  during your visit. If your test results are not back during the visit, make an appointment with your caregiver to find out the results. Do not assume everything is normal if you have not heard from your caregiver or the medical facility. It is important for you to follow up on all of your test results.  ?SEEK IMMEDIATE MEDICAL ATTENTION IF: ?You have more than a spotting of  blood in your stool.  ?Your belly is swollen (abdominal distention).  ?You are nauseated or vomiting.  ?You have a temperature over 101.  ?You have abdominal pain or discomfort that is severe or gets worse throughout the day.  ? ?Your EGD revealed mild amount inflammation in your stomach.  Continue on pantoprazole daily.  I did not see any active or stigmata of bleeding throughout your upper endoscopy or colonoscopy.  Your colon looked healthy. You do have diverticulosis and internal hemorrhoids. I would recommend increasing fiber in your diet or adding OTC Benefiber/Metamucil. Be sure to drink at least 4 to 6 glasses of water daily. Follow-up with GI in 3-4 months ? ? ?I hope you have a great rest of your week! ? ?Elon Alas. Abbey Chatters, D.O. ?Gastroenterology and Hepatology ?[facility] ? ?

## 2021-06-06 NOTE — Anesthesia Postprocedure Evaluation (Signed)
Anesthesia Post Note ? ?Patient: Rhonda Briggs ? ?Procedure(s) Performed: COLONOSCOPY WITH PROPOFOL ?ESOPHAGOGASTRODUODENOSCOPY (EGD) WITH PROPOFOL ? ?Patient location during evaluation: Phase II ?Anesthesia Type: General ?Level of consciousness: awake ?Pain management: pain level controlled ?Vital Signs Assessment: post-procedure vital signs reviewed and stable ?Respiratory status: spontaneous breathing and respiratory function stable ?Cardiovascular status: blood pressure returned to baseline and stable ?Postop Assessment: no headache and no apparent nausea or vomiting ?Anesthetic complications: no ?Comments: Late entry ? ? ?No notable events documented. ? ? ?Last Vitals:  ?Vitals:  ? 06/06/21 1147 06/06/21 1254  ?BP: (!) 157/64 (!) 119/93  ?Pulse: 95 99  ?Resp: 16 (!) 23  ?Temp: 36.7 ?C 36.9 ?C  ?SpO2: 95% 96%  ?  ?Last Pain:  ?Vitals:  ? 06/06/21 1254  ?TempSrc: Axillary  ?PainSc: 0-No pain  ? ? ?  ?  ?  ?  ?  ?  ? ?Louann Sjogren ? ? ? ? ?

## 2021-06-06 NOTE — H&P (Signed)
?Primary Care Physician:  Danna Hefty, DO ?Primary Gastroenterologist:  Dr. Abbey Chatters ? ?Pre-Procedure History & Physical: ?HPI:  Rhonda Briggs is a 71 y.o. female is here  for an EGD and colonoscopy to be performed for GI bleeding, anemia.  ? ?Past Medical History:  ?Diagnosis Date  ? Anemia   ? Arthritis   ? Asthma   ? COPD (chronic obstructive pulmonary disease) ([place])   ? History of kidney stones   ? Hyperlipidemia   ? Hypertension   ? Osteoporosis   ? PONV (postoperative nausea and vomiting)   ? after hernia surgery  ? ? ?Past Surgical History:  ?Procedure Laterality Date  ? BIOPSY  03/18/2021  ? Procedure: BIOPSY;  Surgeon: Harvel Quale, MD;  [facility]: [facility];  Service: Gastroenterology;;  ? CARPAL TUNNEL RELEASE Right 2011  ? COLONOSCOPY WITH PROPOFOL N/A 03/18/2021  ? Procedure: COLONOSCOPY WITH PROPOFOL;  Surgeon: Harvel Quale, MD;  Location: [facility];  Service: Gastroenterology;  Laterality: N/A;  ? ESOPHAGOGASTRODUODENOSCOPY (EGD) WITH PROPOFOL N/A 03/18/2021  ? Procedure: ESOPHAGOGASTRODUODENOSCOPY (EGD) WITH PROPOFOL;  Surgeon: Harvel Quale, MD;  Location: [facility];  Service: Gastroenterology;  Laterality: N/A;  ? GIVENS CAPSULE STUDY N/A 03/19/2021  ? Procedure: GIVENS CAPSULE STUDY;  Surgeon: Rogene Houston, MD;  Location: [facility];  Service: Endoscopy;  Laterality: N/A;  ? HERNIA REPAIR Left   ? inguinal  ? TOTAL KNEE ARTHROPLASTY Right 08/19/2016  ? Procedure: TOTAL KNEE ARTHROPLASTY;  Surgeon: Carole Civil, MD;  Location: [facility];  Service: Orthopedics;  Laterality: Right;  ? TOTAL KNEE ARTHROPLASTY Left 12/31/2016  ? Procedure: TOTAL KNEE ARTHROPLASTY;  Surgeon: Carole Civil, MD;  Location: [facility];  Service: Orthopedics;  Laterality: Left;  ? TUBAL LIGATION    ? ? ?Prior to Admission medications   ?Medication Sig Start Date End Date Taking? Authorizing Provider  ?budesonide-formoterol (SYMBICORT) 160-4.5 MCG/ACT inhaler 2  puffs daily.   Yes [provider]  ?ferrous gluconate (FERGON) 324 MG tablet Take 324 mg by mouth daily with breakfast.   Yes [provider]  ?folic acid (FOLVITE) 1 MG tablet Take 1 tablet (1 mg total) by mouth daily. 03/20/21  Yes Roxan Hockey, MD  ?gabapentin (NEURONTIN) 100 MG capsule Take 100 mg by mouth in the morning, at noon, and at bedtime. 02/19/21  Yes [provider]  ?INCRUSE ELLIPTA 62.5 MCG/ACT AEPB Inhale 1 puff into the lungs daily. 04/25/21  Yes [provider]  ?Multiple Vitamins-Minerals (CENTRUM SILVER 50+WOMEN PO) Take 1 tablet by mouth daily in the afternoon. With vitamin D   Yes [provider]  ?neomycin-polymyxin b-dexamethasone (MAXITROL) 3.5-10000-0.1 OINT Place 1 application into the right eye at bedtime. 03/06/21  Yes [provider]  ?nicotine (NICODERM CQ - DOSED IN MG/24 HOURS) 14 mg/24hr patch Place 1 patch (14 mg total) onto the skin daily. 03/21/21  Yes Emokpae, Courage, MD  ?pantoprazole (PROTONIX) 40 MG tablet Take 1 tablet (40 mg total) by mouth 2 (two) times daily. ?Patient taking differently: Take 40 mg by mouth daily. 03/20/21 03/20/22 Yes Emokpae, Courage, MD  ?polyethylene glycol-electrolytes (TRILYTE) 420 g solution Take 4,000 mLs by mouth as directed. 04/28/21  Yes Eloise Harman, DO  ?pramipexole (MIRAPEX) 0.125 MG tablet Take 0.125 mg by mouth at bedtime.   Yes [provider]  ?pravastatin (PRAVACHOL) 20 MG tablet Take 20 mg by mouth daily.   Yes [provider]  ?predniSONE (DELTASONE) 10 MG tablet  Take 10 mg by mouth daily. 05/26/21  Yes [provider]  ?VENTOLIN HFA 108 (90 Base) MCG/ACT inhaler Inhale 2 puffs into the lungs every 6 (six) hours as needed for wheezing or shortness of breath. 03/20/21  Yes Emokpae, Courage, MD  ?Bismuth 262 MG CHEW Chew 2 each by mouth every 6 (six) hours. ?Patient not taking: Reported on 05/28/2021 03/24/21   Harvel Quale, MD  ? ? ?Allergies as  of 04/28/2021 - Review Complete 03/19/2021  ?Allergen Reaction Noted  ? Amoxicillin Nausea And Vomiting 03/17/2021  ? ? ?Family History  ?Problem Relation Age of Onset  ? Hypertension Mother   ? Lung disease Mother   ? Cancer Father   ? Urticaria Neg Hx   ? Eczema Neg Hx   ? Immunodeficiency Neg Hx   ? Atopy Neg Hx   ? Asthma Neg Hx   ? Angioedema Neg Hx   ? Allergic rhinitis Neg Hx   ? ? ?Social History  ? ?Socioeconomic History  ? Marital status: Widowed  ?  Spouse name: Not on file  ? Number of children: Not on file  ? Years of education: Not on file  ? Highest education level: Not on file  ?Occupational History  ? Not on file  ?Tobacco Use  ? Smoking status: Every Day  ?  Packs/day: 0.50  ?  Years: 48.00  ?  Pack years: 24.00  ?  Types: Cigarettes  ? Smokeless tobacco: Never  ? Tobacco comments:  ?  smokes 10 cigarettes per day 03/05/2020  ?Vaping Use  ? Vaping Use: Never used  ?Substance and Sexual Activity  ? Alcohol use: No  ? Drug use: No  ? Sexual activity: Yes  ?  Birth control/protection: Surgical  ?Other Topics Concern  ? Not on file  ?Social History Narrative  ? Not on file  ? ?Social Determinants of Health  ? ?Financial Resource Strain: Not on file  ?Food Insecurity: Not on file  ?Transportation Needs: Not on file  ?Physical Activity: Not on file  ?Stress: Not on file  ?Social Connections: Not on file  ?Intimate Partner Violence: Not on file  ? ? ?Review of Systems: ?See HPI, otherwise negative ROS ? ?Physical Exam: ?Vital signs in last 24 hours: ?Temp:  [98 ?F (36.7 ?C)] 98 ?F (36.7 ?C) (05/12 1147) ?Pulse Rate:  [95] 95 (05/12 1147) ?Resp:  [16] 16 (05/12 1147) ?BP: (157)/(64) 157/64 (05/12 1147) ?SpO2:  [95 %] 95 % (05/12 1147) ?  ?General:   Alert,  Well-developed, well-nourished, pleasant and cooperative in NAD ?Head:  Normocephalic and atraumatic. ?Eyes:  Sclera clear, no icterus.   Conjunctiva pink. ?Ears:  Normal auditory acuity. ?Nose:  No deformity, discharge,  or lesions. ?Mouth:  No deformity  or lesions, dentition normal. ?Neck:  Supple; no masses or thyromegaly. ?Lungs:  Clear throughout to auscultation.   No wheezes, crackles, or rhonchi. No acute distress. ?Heart:  Regular rate and rhythm; no murmurs, clicks, rubs,  or gallops. ?Abdomen:  Soft, nontender and nondistended. No masses, hepatosplenomegaly or hernias noted. Normal bowel sounds, without guarding, and without rebound.   ?Msk:  Symmetrical without gross deformities. Normal posture. ?Extremities:  Without clubbing or edema. ?Neurologic:  Alert and  oriented x4;  grossly normal neurologically. ?Skin:  Intact without significant lesions or rashes. ?Cervical Nodes:  No significant cervical adenopathy. ?Psych:  Alert and cooperative. Normal mood and affect. ? ?Impression/Plan: ?Rhonda Briggs is here for an EGD and colonoscopy to be performed for  GI bleeding, anemia.  ? ?The risks of the procedure including infection, bleed, or perforation as well as benefits, limitations, alternatives and imponderables have been reviewed with the patient. Questions have been answered. All parties agreeable. ? ?

## 2021-06-06 NOTE — Anesthesia Preprocedure Evaluation (Signed)
Anesthesia Evaluation  ?Patient identified by MRN, date of birth, ID band ?Patient awake ? ? ? ?Reviewed: ?Allergy & Precautions, H&P , NPO status , Patient's Chart, lab work & pertinent test results, reviewed documented beta blocker date and time  ? ?Airway ?Mallampati: II ? ?TM Distance: >3 FB ?Neck ROM: full ? ? ? Dental ?no notable dental hx. ? ?  ?Pulmonary ?neg pulmonary ROS, Current Smoker and Patient abstained from smoking.,  ?  ?Pulmonary exam normal ?breath sounds clear to auscultation ? ? ? ? ? ? Cardiovascular ?Exercise Tolerance: Good ?hypertension, negative cardio ROS ? ? ?Rhythm:regular Rate:Normal ? ? ?  ?Neuro/Psych ?negative neurological ROS ? negative psych ROS  ? GI/Hepatic ?negative GI ROS, Neg liver ROS,   ?Endo/Other  ?negative endocrine ROSType 2 ? Renal/GU ?CRFRenal disease  ?negative genitourinary ?  ?Musculoskeletal ? ? Abdominal ?  ?Peds ? Hematology ? ?(+) Blood dyscrasia, anemia ,   ?Anesthesia Other Findings ? ? Reproductive/Obstetrics ?negative OB ROS ? ?  ? ? ? ? ? ? ? ? ? ? ? ? ? ?  ?  ? ? ? ? ? ? ? ? ?Anesthesia Physical ?Anesthesia Plan ? ?ASA: 3 ? ?Anesthesia Plan: General  ? ?Post-op Pain Management:   ? ?Induction:  ? ?PONV Risk Score and Plan: Propofol infusion ? ?Airway Management Planned:  ? ?Additional Equipment:  ? ?Intra-op Plan:  ? ?Post-operative Plan:  ? ?Informed Consent: I have reviewed the patients History and Physical, chart, labs and discussed the procedure including the risks, benefits and alternatives for the proposed anesthesia with the patient or authorized representative who has indicated his/her understanding and acceptance.  ? ? ? ?Dental Advisory Given ? ?Plan Discussed with: CRNA ? ?Anesthesia Plan Comments:   ? ? ? ? ? ? ?Anesthesia Quick Evaluation ? ?

## 2021-06-06 NOTE — Op Note (Signed)
[facility] ?Patient Name: Rhonda Briggs ?Procedure Date: 06/06/2021 12:21 PM ?MRN: [id number] ?Date of Birth: October 01, 1950 ?Attending MD: Elon Alas. Abbey Chatters , DO ?CSN: [id number] ?Age: 71 ?Admit Type: Outpatient ?Procedure:                Colonoscopy ?Indications:              Hematochezia, Acute post hemorrhagic anemia ?Providers:                Elon Alas. Abbey Chatters, DO, Lambert Mody, Manuela Schwartz  ?                          Valarie Merino, Technician, Shanon Brow  ?                          Tiney Rouge., Technician ?Referring MD:              ?Medicines:                See the Anesthesia note for documentation of the  ?                          administered medications ?Complications:            No immediate complications. ?Estimated Blood Loss:     Estimated blood loss: none. ?Procedure:                Pre-Anesthesia Assessment: ?                          - The anesthesia plan was to use monitored  ?                          anesthesia care (MAC). ?                          After obtaining informed consent, the colonoscope  ?                          was passed under direct vision. Throughout the  ?                          procedure, the patient's blood pressure, pulse, and  ?                          oxygen saturations were monitored continuously. The  ?                          PCF-HQ190L ([id number]) scope was introduced through  ?                          the anus and advanced to the the cecum, identified  ?                          by appendiceal orifice and ileocecal valve. The  ?                          colonoscopy was performed without difficulty. The  ?  patient tolerated the procedure well. The quality  ?                          of the bowel preparation was evaluated using the  ?                          BBPS [facility] Bowel Preparation Scale) with scores  ?                          of: Right Colon = 3, Transverse Colon = 3 and Left  ?                          Colon = 3 (entire  mucosa seen well with no residual  ?                          staining, small fragments of stool or opaque  ?                          liquid). The total BBPS score equals 9. ?Scope In: 12:40:27 PM ?Scope Out: 12:51:55 PM ?Scope Withdrawal Time: 0 hours 6 minutes 25 seconds  ?Total Procedure Duration: 0 hours 11 minutes 28 seconds  ?Findings: ?     The perianal and digital rectal examinations were normal. ?     Non-bleeding internal hemorrhoids were found during endoscopy. ?     Multiple small and large-mouthed diverticula were found in the sigmoid  ?     colon, descending colon and ascending colon. ?     The terminal ileum appeared normal. ?Impression:               - Non-bleeding internal hemorrhoids. ?                          - Diverticulosis in the sigmoid colon, in the  ?                          descending colon and in the ascending colon. ?                          - The examined portion of the ileum was normal. ?                          - No specimens collected. ?Moderate Sedation: ?     Per Anesthesia Care ?Recommendation:           - Patient has a contact number available for  ?                          emergencies. The signs and symptoms of potential  ?                          delayed complications were discussed with the  ?                          patient. Return to normal activities tomorrow.  ?  Written discharge instructions were provided to the  ?                          patient. ?                          - Resume previous diet. ?                          - Continue present medications. ?                          - Repeat colonoscopy in 5 years for surveillance. ?                          - Return to GI clinic in 4 months. ?                          - Etiology of patient's recent GI bleed remains  ?                          elusive. Possible Dieulafoy lesion we are no longer  ?                          seeing. ?Procedure Code(s):        --- Professional --- ?                           [phone], Colonoscopy, flexible; diagnostic, including  ?                          collection of specimen(s) by brushing or washing,  ?                          when performed (separate procedure) ?Diagnosis Code(s):        --- Professional --- ?                          V95.6, Other hemorrhoids ?                          K92.1, Melena (includes Hematochezia) ?                          D62, Acute posthemorrhagic anemia ?                          K57.30, Diverticulosis of large intestine without  ?                          perforation or abscess without bleeding ?CPT copyright 2019 American Medical Association. All rights reserved. ?The codes documented in this report are preliminary and upon coder review may  ?be revised to meet current compliance requirements. ?Elon Alas. Abbey Chatters, DO ?Elon Alas. Abbey Chatters, DO ?06/06/2021 12:54:59 PM ?This report has been signed electronically. ?Number of Addenda: 0 ?

## 2021-06-13 ENCOUNTER — Encounter (HOSPITAL_COMMUNITY): Payer: Self-pay | Admitting: Internal Medicine

## 2021-06-19 ENCOUNTER — Encounter: Payer: Self-pay | Admitting: Internal Medicine

## 2021-09-09 ENCOUNTER — Encounter: Payer: Self-pay | Admitting: *Deleted

## 2022-01-29 ENCOUNTER — Emergency Department (HOSPITAL_COMMUNITY): Payer: Medicare HMO

## 2022-01-29 ENCOUNTER — Inpatient Hospital Stay (HOSPITAL_COMMUNITY)
Admission: EM | Admit: 2022-01-29 | Discharge: 2022-02-03 | DRG: 191 | Disposition: A | Discharge status: Home Health | Payer: Medicare HMO | Attending: Family Medicine | Admitting: Family Medicine

## 2022-01-29 ENCOUNTER — Encounter (HOSPITAL_COMMUNITY): Payer: Self-pay | Admitting: Emergency Medicine

## 2022-01-29 DIAGNOSIS — Z1152 Encounter for screening for COVID-19: Secondary | ICD-10-CM

## 2022-01-29 DIAGNOSIS — Z79899 Other long term (current) drug therapy: Secondary | ICD-10-CM

## 2022-01-29 DIAGNOSIS — E538 Deficiency of other specified B group vitamins: Secondary | ICD-10-CM | POA: Diagnosis present

## 2022-01-29 DIAGNOSIS — Z716 Tobacco abuse counseling: Secondary | ICD-10-CM

## 2022-01-29 DIAGNOSIS — Z881 Allergy status to other antibiotic agents status: Secondary | ICD-10-CM

## 2022-01-29 DIAGNOSIS — B9620 Unspecified Escherichia coli [E. coli] as the cause of diseases classified elsewhere: Secondary | ICD-10-CM | POA: Diagnosis present

## 2022-01-29 DIAGNOSIS — F17210 Nicotine dependence, cigarettes, uncomplicated: Secondary | ICD-10-CM | POA: Diagnosis present

## 2022-01-29 DIAGNOSIS — E871 Hypo-osmolality and hyponatremia: Secondary | ICD-10-CM | POA: Diagnosis present

## 2022-01-29 DIAGNOSIS — R197 Diarrhea, unspecified: Secondary | ICD-10-CM | POA: Diagnosis present

## 2022-01-29 DIAGNOSIS — J441 Chronic obstructive pulmonary disease with (acute) exacerbation: Secondary | ICD-10-CM | POA: Diagnosis not present

## 2022-01-29 DIAGNOSIS — Z28310 Unvaccinated for covid-19: Secondary | ICD-10-CM

## 2022-01-29 DIAGNOSIS — I10 Essential (primary) hypertension: Secondary | ICD-10-CM | POA: Diagnosis present

## 2022-01-29 DIAGNOSIS — Z8616 Personal history of COVID-19: Secondary | ICD-10-CM

## 2022-01-29 DIAGNOSIS — K1379 Other lesions of oral mucosa: Secondary | ICD-10-CM | POA: Diagnosis present

## 2022-01-29 DIAGNOSIS — Z8249 Family history of ischemic heart disease and other diseases of the circulatory system: Secondary | ICD-10-CM

## 2022-01-29 DIAGNOSIS — E876 Hypokalemia: Secondary | ICD-10-CM | POA: Diagnosis not present

## 2022-01-29 DIAGNOSIS — F17200 Nicotine dependence, unspecified, uncomplicated: Secondary | ICD-10-CM | POA: Diagnosis present

## 2022-01-29 DIAGNOSIS — N390 Urinary tract infection, site not specified: Secondary | ICD-10-CM | POA: Diagnosis present

## 2022-01-29 DIAGNOSIS — Z66 Do not resuscitate: Secondary | ICD-10-CM | POA: Diagnosis present

## 2022-01-29 DIAGNOSIS — E785 Hyperlipidemia, unspecified: Secondary | ICD-10-CM | POA: Diagnosis present

## 2022-01-29 DIAGNOSIS — D61818 Other pancytopenia: Secondary | ICD-10-CM | POA: Diagnosis not present

## 2022-01-29 DIAGNOSIS — N179 Acute kidney failure, unspecified: Secondary | ICD-10-CM | POA: Diagnosis present

## 2022-01-29 DIAGNOSIS — N3000 Acute cystitis without hematuria: Secondary | ICD-10-CM

## 2022-01-29 DIAGNOSIS — L24A0 Irritant contact dermatitis due to friction or contact with body fluids, unspecified: Secondary | ICD-10-CM | POA: Diagnosis present

## 2022-01-29 DIAGNOSIS — Z7952 Long term (current) use of systemic steroids: Secondary | ICD-10-CM

## 2022-01-29 DIAGNOSIS — Z96653 Presence of artificial knee joint, bilateral: Secondary | ICD-10-CM | POA: Diagnosis present

## 2022-01-29 DIAGNOSIS — Z7951 Long term (current) use of inhaled steroids: Secondary | ICD-10-CM

## 2022-01-29 DIAGNOSIS — R0602 Shortness of breath: Secondary | ICD-10-CM | POA: Diagnosis present

## 2022-01-29 DIAGNOSIS — E860 Dehydration: Secondary | ICD-10-CM | POA: Diagnosis present

## 2022-01-29 DIAGNOSIS — M1990 Unspecified osteoarthritis, unspecified site: Secondary | ICD-10-CM | POA: Diagnosis present

## 2022-01-29 DIAGNOSIS — M810 Age-related osteoporosis without current pathological fracture: Secondary | ICD-10-CM | POA: Diagnosis present

## 2022-01-29 LAB — CBC WITH DIFFERENTIAL/PLATELET
Abs Immature Granulocytes: 0.1 10*3/uL — ABNORMAL HIGH (ref 0.00–0.07)
Basophils Absolute: 0 10*3/uL (ref 0.0–0.1)
Basophils Relative: 0 %
Eosinophils Absolute: 0 10*3/uL (ref 0.0–0.5)
Eosinophils Relative: 1 %
HCT: 38 % (ref 36.0–46.0)
Hemoglobin: 12.2 g/dL (ref 12.0–15.0)
Lymphocytes Relative: 43 %
Lymphs Abs: 1.3 10*3/uL (ref 0.7–4.0)
MCH: 26.2 pg (ref 26.0–34.0)
MCHC: 32.1 g/dL (ref 30.0–36.0)
MCV: 81.7 fL (ref 80.0–100.0)
Metamyelocytes Relative: 2 %
Monocytes Absolute: 0.1 10*3/uL (ref 0.1–1.0)
Monocytes Relative: 3 %
Myelocytes: 1 %
Neutro Abs: 1.6 10*3/uL — ABNORMAL LOW (ref 1.7–7.7)
Neutrophils Relative %: 50 %
Platelets: 327 10*3/uL (ref 150–400)
RBC: 4.65 MIL/uL (ref 3.87–5.11)
RDW: 16.3 % — ABNORMAL HIGH (ref 11.5–15.5)
WBC: 3.1 10*3/uL — ABNORMAL LOW (ref 4.0–10.5)
nRBC: 0 % (ref 0.0–0.2)

## 2022-01-29 LAB — BASIC METABOLIC PANEL
Anion gap: 12 (ref 5–15)
BUN: 65 mg/dL — ABNORMAL HIGH (ref 8–23)
CO2: 28 mmol/L (ref 22–32)
Calcium: 8.9 mg/dL (ref 8.9–10.3)
Chloride: 92 mmol/L — ABNORMAL LOW (ref 98–111)
Creatinine, Ser: 2.09 mg/dL — ABNORMAL HIGH (ref 0.44–1.00)
GFR, Estimated: 25 mL/min — ABNORMAL LOW (ref >60)
Glucose, Bld: 152 mg/dL — ABNORMAL HIGH (ref 70–99)
Potassium: 3.7 mmol/L (ref 3.5–5.1)
Sodium: 132 mmol/L — ABNORMAL LOW (ref 135–145)

## 2022-01-29 LAB — RESP PANEL BY RT-PCR (RSV, FLU A&B, COVID)  RVPGX2
Influenza A by PCR: NEGATIVE
Influenza B by PCR: NEGATIVE
Resp Syncytial Virus by PCR: NEGATIVE
SARS Coronavirus 2 by RT PCR: NEGATIVE

## 2022-01-29 MED ORDER — LACTATED RINGERS IV BOLUS
1000.0000 mL | Freq: Once | INTRAVENOUS | Status: AC
  Administered 2022-01-30: 1000 mL via INTRAVENOUS

## 2022-01-29 MED ORDER — IPRATROPIUM-ALBUTEROL 0.5-2.5 (3) MG/3ML IN SOLN
3.0000 mL | Freq: Once | RESPIRATORY_TRACT | Status: AC
  Administered 2022-01-30: 3 mL via RESPIRATORY_TRACT
  Filled 2022-01-29: qty 3

## 2022-01-29 MED ORDER — ALBUTEROL SULFATE (2.5 MG/3ML) 0.083% IN NEBU
2.5000 mg | INHALATION_SOLUTION | Freq: Once | RESPIRATORY_TRACT | Status: AC
  Administered 2022-01-30: 2.5 mg via RESPIRATORY_TRACT
  Filled 2022-01-29: qty 3

## 2022-01-29 NOTE — ED Provider Notes (Signed)
[facility] EMERGENCY DEPARTMENT Provider Note   CSN: [id number] Arrival date & time: 01/29/22  2057     History  Chief Complaint  Patient presents with   Shortness of Breath   Cough    Covid positive    Diarrhea    Nashley Cordoba is a 72 y.o. female.  Presents to the emergency department for evaluation of illness for nearly a week.  She reports that she has been experiencing diarrhea, nausea and vomiting.  She also has had a persistent cough with increasing shortness of breath.  She has a history of COPD.  She was seen by her doctor yesterday and was told that if she did not get any better she would need to be admitted.  Patient feels very weak.  She does not use oxygen at home.       Home Medications Prior to Admission medications   Medication Sig Start Date End Date Taking? Authorizing Provider  Bismuth 262 MG CHEW Chew 2 each by mouth every 6 (six) hours. Patient not taking: Reported on 05/28/2021 03/24/21   Montez Morita, Quillian Quince, MD  budesonide-formoterol [facility]) 160-4.5 MCG/ACT inhaler 2 puffs daily.    [provider]  ferrous gluconate (FERGON) 324 MG tablet Take 324 mg by mouth daily with breakfast.    [provider]  folic acid (FOLVITE) 1 MG tablet Take 1 tablet (1 mg total) by mouth daily. 03/20/21   Roxan Hockey, MD  gabapentin (NEURONTIN) 100 MG capsule Take 100 mg by mouth in the morning, at noon, and at bedtime. 02/19/21   [provider]  INCRUSE ELLIPTA 62.5 MCG/ACT AEPB Inhale 1 puff into the lungs daily. 04/25/21   [provider]  Multiple Vitamins-Minerals (CENTRUM SILVER 50+WOMEN PO) Take 1 tablet by mouth daily in the afternoon. With vitamin D    [provider]  neomycin-polymyxin b-dexamethasone (MAXITROL) 3.5-10000-0.1 OINT Place 1 application into the right eye at bedtime. 03/06/21   [provider]  nicotine (NICODERM CQ - DOSED IN MG/24 HOURS) 14 mg/24hr patch Place 1 patch (14 mg total) onto  the skin daily. 03/21/21   Roxan Hockey, MD  pantoprazole (PROTONIX) 40 MG tablet Take 1 tablet (40 mg total) by mouth 2 (two) times daily. Patient taking differently: Take 40 mg by mouth daily. 03/20/21 03/20/22  Roxan Hockey, MD  polyethylene glycol-electrolytes (TRILYTE) 420 g solution Take 4,000 mLs by mouth as directed. 04/28/21   Eloise Harman, DO  pramipexole (MIRAPEX) 0.125 MG tablet Take 0.125 mg by mouth at bedtime.    [provider]  pravastatin (PRAVACHOL) 20 MG tablet Take 20 mg by mouth daily.    [provider]  predniSONE (DELTASONE) 10 MG tablet Take 10 mg by mouth daily. 05/26/21   [provider]  VENTOLIN HFA 108 (90 Base) MCG/ACT inhaler Inhale 2 puffs into the lungs every 6 (six) hours as needed for wheezing or shortness of breath. 03/20/21   Roxan Hockey, MD      Allergies    Amoxicillin    Review of Systems   Review of Systems  Physical Exam Updated Vital Signs BP 112/64   Pulse 89   Temp 98.4 F (36.9 C) (Oral)   Resp 19   Ht '4\' 10"'$  (1.473 m)   Wt 66.7 kg   SpO2 96%   BMI 30.72 kg/m  Physical Exam Vitals and nursing note reviewed.  Constitutional:      General: She is not in acute distress.    Appearance:  She is well-developed.  HENT:     Head: Normocephalic and atraumatic.     Mouth/Throat:     Mouth: Mucous membranes are moist.  Eyes:     General: Vision grossly intact. Gaze aligned appropriately.     Extraocular Movements: Extraocular movements intact.     Conjunctiva/sclera: Conjunctivae normal.  Cardiovascular:     Rate and Rhythm: Normal rate and regular rhythm.     Pulses: Normal pulses.     Heart sounds: Normal heart sounds, S1 normal and S2 normal. No murmur heard.    No friction rub. No gallop.  Pulmonary:     Effort: Pulmonary effort is normal. Tachypnea present. No respiratory distress.     Breath sounds: Decreased breath sounds and wheezing present.  Abdominal:     General: Bowel sounds are  normal.     Palpations: Abdomen is soft.     Tenderness: There is no abdominal tenderness. There is no guarding or rebound.     Hernia: No hernia is present.  Musculoskeletal:        General: No swelling.     Cervical back: Full passive range of motion without pain, normal range of motion and neck supple. No spinous process tenderness or muscular tenderness. Normal range of motion.     Right lower leg: No edema.     Left lower leg: No edema.  Skin:    General: Skin is warm and dry.     Capillary Refill: Capillary refill takes less than 2 seconds.     Findings: Rash (Buttock, perianal, perineal and inguinal breakdown, wet and oozing with some bleeding) present. No ecchymosis, erythema or wound.  Neurological:     General: No focal deficit present.     Mental Status: She is alert and oriented to person, place, and time.     GCS: GCS eye subscore is 4. GCS verbal subscore is 5. GCS motor subscore is 6.     Cranial Nerves: Cranial nerves 2-12 are intact.     Sensory: Sensation is intact.     Motor: Motor function is intact.     Coordination: Coordination is intact.  Psychiatric:        Attention and Perception: Attention normal.        Mood and Affect: Mood normal.        Speech: Speech normal.        Behavior: Behavior normal.     ED Results / Procedures / Treatments   Labs (all labs ordered are listed, but only abnormal results are displayed) Labs Reviewed  CBC WITH DIFFERENTIAL/PLATELET - Abnormal; Notable for the following components:      Result Value   WBC 3.1 (*)    RDW 16.3 (*)    Neutro Abs 1.6 (*)    Abs Immature Granulocytes 0.10 (*)    All other components within normal limits  BASIC METABOLIC PANEL - Abnormal; Notable for the following components:   Sodium 132 (*)    Chloride 92 (*)    Glucose, Bld 152 (*)    BUN 65 (*)    Creatinine, Ser 2.09 (*)    GFR, Estimated 25 (*)    All other components within normal limits  RESP PANEL BY RT-PCR (RSV, FLU A&B, COVID)   [id number]  C DIFFICILE QUICK SCREEN W PCR REFLEX    GASTROINTESTINAL PANEL BY PCR, STOOL (REPLACES STOOL CULTURE)  POC OCCULT BLOOD, ED    EKG EKG Interpretation  Date/Time:  Thursday January 29 2022 03:21:22  EST Ventricular Rate:  104 PR Interval:  158 QRS Duration: 120 QT Interval:  342 QTC Calculation: 449 R Axis:   55 Text Interpretation: Sinus tachycardia Low voltage QRS Right bundle branch block Abnormal ECG When compared with ECG of 20-Mar-2021 16:40, Criteria for Septal infarct are no longer Present Confirmed by Aletta Edouard [phone]) on 01/29/2022 9:11:28 PM  Radiology DG Chest 2 View  Result Date: 01/29/2022 CLINICAL DATA:  Shortness of breath history of COVID EXAM: CHEST - 2 VIEW COMPARISON:  CT 03/17/2021 FINDINGS: Emphysema with bronchitic changes. No acute consolidation or effusion. Normal cardiac size. No pneumothorax. IMPRESSION: No active cardiopulmonary disease. Emphysema with bronchitic changes. Electronically Signed   By: Donavan Foil M.D.   On: 01/29/2022 21:39    Procedures Procedures    Medications Ordered in ED Medications  methylPREDNISolone sodium succinate (SOLU-MEDROL) 125 mg/2 mL injection 60 mg (has no administration in time range)  lactated ringers bolus 1,000 mL (0 mLs Intravenous Stopped 01/30/22 0251)  ipratropium-albuterol (DUONEB) 0.5-2.5 (3) MG/3ML nebulizer solution 3 mL (3 mLs Nebulization Given 01/30/22 0015)  albuterol (PROVENTIL) (2.5 MG/3ML) 0.083% nebulizer solution 2.5 mg (2.5 mg Nebulization Given 01/30/22 0015)  lidocaine (XYLOCAINE) 2 % jelly 1 Application (1 Application Topical Given 01/30/22 0200)    ED Course/ Medical Decision Making/ A&P                           Medical Decision Making Amount and/or Complexity of Data Reviewed Labs: ordered. Decision-making details documented in ED Course. Radiology: ordered and independent interpretation performed. Decision-making details documented in ED Course. ECG/medicine tests: ordered and  independent interpretation performed. Decision-making details documented in ED Course.  Risk Prescription drug management.   Patient presents to the emergency department for evaluation of multiple problems.  With history of COPD presents to the emergency department for evaluation of nausea, vomiting, diarrhea with increasing cough and shortness of breath.  Patient with increased work of breathing and wheezing throughout upon initial evaluation.  This improved with bronchodilator therapy.  Oxygen saturations were borderline at arrival, 91% on room air.  This improved with bronchodilator therapy as well.  Chest x-ray without pneumonia.  Patient administered Solu-Medrol.  Indicates that she has been having significant amounts of watery diarrhea.  She indicates that this has been slowing down but she clearly has had a fair amount of volume loss as her BUN and creatinine are elevated.  Additionally, she reported that she had a "hemorrhoid".  When this was examined, patient has marked breakdown secondary to the diarrhea.  Will admit patient for further management of COPD exacerbation and acute kidney injury.        Final Clinical Impression(s) / ED Diagnoses Final diagnoses:  COPD exacerbation ([place])  AKI (acute kidney injury) ([place])    Rx / [place] Orders ED Discharge Orders     None         Nyasiah Moffet, Gwenyth Allegra, MD 01/30/22 [phone]

## 2022-01-29 NOTE — ED Triage Notes (Addendum)
Pt reports dx with COVID on Wednesday, hx COPD. Reports SHOB, Diarrhea, cough. Denies chest pain

## 2022-01-29 NOTE — ED Provider Notes (Incomplete)
[facility] EMERGENCY DEPARTMENT Provider Note   CSN: [id number] Arrival date & time: 01/29/22  2057     History {Add pertinent medical, surgical, social history, OB history to HPI:1} Chief Complaint  Patient presents with  . Shortness of Breath  . Cough    Covid positive   . Diarrhea    Rhonda Briggs is a 72 y.o. female.  Presents to the emergency department for evaluation of illness for nearly a week.  She reports that she has been experiencing diarrhea, nausea and vomiting.  She also has had a persistent cough with increasing shortness of breath.  She has a history of COPD.  She was seen by her doctor yesterday and was told that if she did not get any better she would need to be admitted.  Patient feels very weak.  She does not use oxygen at home.       Home Medications Prior to Admission medications   Medication Sig Start Date End Date Taking? Authorizing Provider  Bismuth 262 MG CHEW Chew 2 each by mouth every 6 (six) hours. Patient not taking: Reported on 05/28/2021 03/24/21   Montez Morita, Quillian Quince, MD  budesonide-formoterol [facility]) 160-4.5 MCG/ACT inhaler 2 puffs daily.    [provider]  ferrous gluconate (FERGON) 324 MG tablet Take 324 mg by mouth daily with breakfast.    [provider]  folic acid (FOLVITE) 1 MG tablet Take 1 tablet (1 mg total) by mouth daily. 03/20/21   Roxan Hockey, MD  gabapentin (NEURONTIN) 100 MG capsule Take 100 mg by mouth in the morning, at noon, and at bedtime. 02/19/21   [provider]  INCRUSE ELLIPTA 62.5 MCG/ACT AEPB Inhale 1 puff into the lungs daily. 04/25/21   [provider]  Multiple Vitamins-Minerals (CENTRUM SILVER 50+WOMEN PO) Take 1 tablet by mouth daily in the afternoon. With vitamin D    [provider]  neomycin-polymyxin b-dexamethasone (MAXITROL) 3.5-10000-0.1 OINT Place 1 application into the right eye at bedtime. 03/06/21   [provider]  nicotine (NICODERM CQ -  DOSED IN MG/24 HOURS) 14 mg/24hr patch Place 1 patch (14 mg total) onto the skin daily. 03/21/21   Roxan Hockey, MD  pantoprazole (PROTONIX) 40 MG tablet Take 1 tablet (40 mg total) by mouth 2 (two) times daily. Patient taking differently: Take 40 mg by mouth daily. 03/20/21 03/20/22  Roxan Hockey, MD  polyethylene glycol-electrolytes (TRILYTE) 420 g solution Take 4,000 mLs by mouth as directed. 04/28/21   Eloise Harman, DO  pramipexole (MIRAPEX) 0.125 MG tablet Take 0.125 mg by mouth at bedtime.    [provider]  pravastatin (PRAVACHOL) 20 MG tablet Take 20 mg by mouth daily.    [provider]  predniSONE (DELTASONE) 10 MG tablet Take 10 mg by mouth daily. 05/26/21   [provider]  VENTOLIN HFA 108 (90 Base) MCG/ACT inhaler Inhale 2 puffs into the lungs every 6 (six) hours as needed for wheezing or shortness of breath. 03/20/21   Roxan Hockey, MD      Allergies    Amoxicillin    Review of Systems   Review of Systems  Physical Exam Updated Vital Signs BP 129/62 (BP Location: Right Arm)   Pulse 97   Temp 98.2 F (36.8 C) (Oral)   Resp 20   Ht '4\' 10"'$  (1.473 m)   Wt 66.7 kg   SpO2 92%   BMI 30.72 kg/m  Physical Exam  ED Results / Procedures / Treatments   Labs (  all labs ordered are listed, but only abnormal results are displayed) Labs Reviewed  CBC WITH DIFFERENTIAL/PLATELET - Abnormal; Notable for the following components:      Result Value   WBC 3.1 (*)    RDW 16.3 (*)    Neutro Abs 1.6 (*)    Abs Immature Granulocytes 0.10 (*)    All other components within normal limits  BASIC METABOLIC PANEL - Abnormal; Notable for the following components:   Sodium 132 (*)    Chloride 92 (*)    Glucose, Bld 152 (*)    BUN 65 (*)    Creatinine, Ser 2.09 (*)    GFR, Estimated 25 (*)    All other components within normal limits  RESP PANEL BY RT-PCR (RSV, FLU A&B, COVID)  RVPGX2  POC OCCULT BLOOD, ED    EKG EKG  Interpretation  Date/Time:  Thursday January 29 2022 21:07:28 EST Ventricular Rate:  104 PR Interval:  158 QRS Duration: 120 QT Interval:  342 QTC Calculation: 449 R Axis:   55 Text Interpretation: Sinus tachycardia Low voltage QRS Right bundle branch block Abnormal ECG When compared with ECG of 20-Mar-2021 16:40, Criteria for Septal infarct are no longer Present Confirmed by Aletta Edouard [phone]) on 01/29/2022 9:11:28 PM  Radiology DG Chest 2 View  Result Date: 01/29/2022 CLINICAL DATA:  Shortness of breath history of COVID EXAM: CHEST - 2 VIEW COMPARISON:  CT 03/17/2021 FINDINGS: Emphysema with bronchitic changes. No acute consolidation or effusion. Normal cardiac size. No pneumothorax. IMPRESSION: No active cardiopulmonary disease. Emphysema with bronchitic changes. Electronically Signed   By: Donavan Foil M.D.   On: 01/29/2022 21:39    Procedures Procedures  {Document cardiac monitor, telemetry assessment procedure when appropriate:1}  Medications Ordered in ED Medications  lactated ringers bolus 1,000 mL (has no administration in time range)  ipratropium-albuterol (DUONEB) 0.5-2.5 (3) MG/3ML nebulizer solution 3 mL (has no administration in time range)  albuterol (PROVENTIL) (2.5 MG/3ML) 0.083% nebulizer solution 2.5 mg (has no administration in time range)    ED Course/ Medical Decision Making/ A&P                           Medical Decision Making Amount and/or Complexity of Data Reviewed Labs: ordered. Radiology: ordered.  Risk Prescription drug management.   ***  {Document critical care time when appropriate:1} {Document review of labs and clinical decision tools ie heart score, Chads2Vasc2 etc:1}  {Document your independent review of radiology images, and any outside records:1} {Document your discussion with family members, caretakers, and with consultants:1} {Document social determinants of health affecting pt's care:1} {Document your decision making why or why  not admission, treatments were needed:1} Final Clinical Impression(s) / ED Diagnoses Final diagnoses:  None    Rx / DC Orders ED Discharge Orders     None

## 2022-01-30 DIAGNOSIS — R0602 Shortness of breath: Secondary | ICD-10-CM | POA: Diagnosis present

## 2022-01-30 DIAGNOSIS — D61818 Other pancytopenia: Secondary | ICD-10-CM | POA: Diagnosis not present

## 2022-01-30 DIAGNOSIS — M810 Age-related osteoporosis without current pathological fracture: Secondary | ICD-10-CM | POA: Diagnosis present

## 2022-01-30 DIAGNOSIS — F17200 Nicotine dependence, unspecified, uncomplicated: Secondary | ICD-10-CM

## 2022-01-30 DIAGNOSIS — I10 Essential (primary) hypertension: Secondary | ICD-10-CM | POA: Diagnosis present

## 2022-01-30 DIAGNOSIS — Z96653 Presence of artificial knee joint, bilateral: Secondary | ICD-10-CM | POA: Diagnosis present

## 2022-01-30 DIAGNOSIS — E871 Hypo-osmolality and hyponatremia: Secondary | ICD-10-CM | POA: Diagnosis present

## 2022-01-30 DIAGNOSIS — E785 Hyperlipidemia, unspecified: Secondary | ICD-10-CM

## 2022-01-30 DIAGNOSIS — L24A0 Irritant contact dermatitis due to friction or contact with body fluids, unspecified: Secondary | ICD-10-CM | POA: Diagnosis present

## 2022-01-30 DIAGNOSIS — Z881 Allergy status to other antibiotic agents status: Secondary | ICD-10-CM | POA: Diagnosis not present

## 2022-01-30 DIAGNOSIS — M1990 Unspecified osteoarthritis, unspecified site: Secondary | ICD-10-CM | POA: Diagnosis present

## 2022-01-30 DIAGNOSIS — N179 Acute kidney failure, unspecified: Secondary | ICD-10-CM

## 2022-01-30 DIAGNOSIS — A09 Infectious gastroenteritis and colitis, unspecified: Secondary | ICD-10-CM | POA: Diagnosis not present

## 2022-01-30 DIAGNOSIS — Z7951 Long term (current) use of inhaled steroids: Secondary | ICD-10-CM | POA: Diagnosis not present

## 2022-01-30 DIAGNOSIS — R197 Diarrhea, unspecified: Secondary | ICD-10-CM | POA: Diagnosis present

## 2022-01-30 DIAGNOSIS — Z8616 Personal history of COVID-19: Secondary | ICD-10-CM | POA: Diagnosis not present

## 2022-01-30 DIAGNOSIS — J441 Chronic obstructive pulmonary disease with (acute) exacerbation: Secondary | ICD-10-CM | POA: Diagnosis not present

## 2022-01-30 DIAGNOSIS — Z8249 Family history of ischemic heart disease and other diseases of the circulatory system: Secondary | ICD-10-CM | POA: Diagnosis not present

## 2022-01-30 DIAGNOSIS — Z1152 Encounter for screening for COVID-19: Secondary | ICD-10-CM | POA: Diagnosis not present

## 2022-01-30 DIAGNOSIS — F17210 Nicotine dependence, cigarettes, uncomplicated: Secondary | ICD-10-CM | POA: Diagnosis present

## 2022-01-30 DIAGNOSIS — E538 Deficiency of other specified B group vitamins: Secondary | ICD-10-CM | POA: Diagnosis present

## 2022-01-30 DIAGNOSIS — N390 Urinary tract infection, site not specified: Secondary | ICD-10-CM | POA: Diagnosis present

## 2022-01-30 DIAGNOSIS — Z28310 Unvaccinated for covid-19: Secondary | ICD-10-CM | POA: Diagnosis not present

## 2022-01-30 DIAGNOSIS — Z66 Do not resuscitate: Secondary | ICD-10-CM | POA: Diagnosis present

## 2022-01-30 DIAGNOSIS — B9620 Unspecified Escherichia coli [E. coli] as the cause of diseases classified elsewhere: Secondary | ICD-10-CM | POA: Diagnosis present

## 2022-01-30 DIAGNOSIS — E860 Dehydration: Secondary | ICD-10-CM | POA: Diagnosis present

## 2022-01-30 DIAGNOSIS — E876 Hypokalemia: Secondary | ICD-10-CM | POA: Diagnosis not present

## 2022-01-30 LAB — CBC WITH DIFFERENTIAL/PLATELET
Abs Immature Granulocytes: 0.03 10*3/uL (ref 0.00–0.07)
Basophils Absolute: 0 10*3/uL (ref 0.0–0.1)
Basophils Relative: 0 %
Eosinophils Absolute: 0 10*3/uL (ref 0.0–0.5)
Eosinophils Relative: 1 %
HCT: 32.3 % — ABNORMAL LOW (ref 36.0–46.0)
Hemoglobin: 10.1 g/dL — ABNORMAL LOW (ref 12.0–15.0)
Immature Granulocytes: 2 %
Lymphocytes Relative: 18 %
Lymphs Abs: 0.4 10*3/uL — ABNORMAL LOW (ref 0.7–4.0)
MCH: 25.6 pg — ABNORMAL LOW (ref 26.0–34.0)
MCHC: 31.3 g/dL (ref 30.0–36.0)
MCV: 82 fL (ref 80.0–100.0)
Monocytes Absolute: 0 10*3/uL — ABNORMAL LOW (ref 0.1–1.0)
Monocytes Relative: 2 %
Neutro Abs: 1.5 10*3/uL — ABNORMAL LOW (ref 1.7–7.7)
Neutrophils Relative %: 77 %
Platelets: 248 10*3/uL (ref 150–400)
RBC: 3.94 MIL/uL (ref 3.87–5.11)
RDW: 15.9 % — ABNORMAL HIGH (ref 11.5–15.5)
WBC: 2 10*3/uL — ABNORMAL LOW (ref 4.0–10.5)
nRBC: 0 % (ref 0.0–0.2)

## 2022-01-30 LAB — MAGNESIUM: Magnesium: 1.8 mg/dL (ref 1.7–2.4)

## 2022-01-30 LAB — COMPREHENSIVE METABOLIC PANEL
ALT: 9 U/L (ref 0–44)
AST: 10 U/L — ABNORMAL LOW (ref 15–41)
Albumin: 3 g/dL — ABNORMAL LOW (ref 3.5–5.0)
Alkaline Phosphatase: 56 U/L (ref 38–126)
Anion gap: 11 (ref 5–15)
BUN: 62 mg/dL — ABNORMAL HIGH (ref 8–23)
CO2: 27 mmol/L (ref 22–32)
Calcium: 8.3 mg/dL — ABNORMAL LOW (ref 8.9–10.3)
Chloride: 97 mmol/L — ABNORMAL LOW (ref 98–111)
Creatinine, Ser: 1.99 mg/dL — ABNORMAL HIGH (ref 0.44–1.00)
GFR, Estimated: 26 mL/min — ABNORMAL LOW (ref >60)
Glucose, Bld: 139 mg/dL — ABNORMAL HIGH (ref 70–99)
Potassium: 3.5 mmol/L (ref 3.5–5.1)
Sodium: 135 mmol/L (ref 135–145)
Total Bilirubin: 1.2 mg/dL (ref 0.3–1.2)
Total Protein: 5.9 g/dL — ABNORMAL LOW (ref 6.5–8.1)

## 2022-01-30 LAB — URINALYSIS, ROUTINE W REFLEX MICROSCOPIC
Bilirubin Urine: NEGATIVE
Glucose, UA: NEGATIVE mg/dL
Hgb urine dipstick: NEGATIVE
Ketones, ur: 5 mg/dL — AB
Nitrite: NEGATIVE
Protein, ur: 30 mg/dL — AB
Specific Gravity, Urine: 1.023 (ref 1.005–1.030)
pH: 5 (ref 5.0–8.0)

## 2022-01-30 LAB — LACTIC ACID, PLASMA
Lactic Acid, Venous: 0.9 mmol/L (ref 0.5–1.9)
Lactic Acid, Venous: 1.3 mmol/L (ref 0.5–1.9)

## 2022-01-30 MED ORDER — ALBUTEROL SULFATE (2.5 MG/3ML) 0.083% IN NEBU: 2.5000 mg | INHALATION_SOLUTION | RESPIRATORY_TRACT | Status: DC | PRN

## 2022-01-30 MED ORDER — LIDOCAINE VISCOUS HCL 2 % MT SOLN
15.0000 mL | OROMUCOSAL | Status: DC | PRN
  Administered 2022-01-30 – 2022-02-01 (×5): 15 mL via OROMUCOSAL
  Filled 2022-01-30 (×8): qty 15

## 2022-01-30 MED ORDER — OXYCODONE HCL 5 MG PO TABS
5.0000 mg | ORAL_TABLET | ORAL | Status: DC | PRN
  Administered 2022-01-30 – 2022-02-03 (×8): 5 mg via ORAL
  Filled 2022-01-30 (×9): qty 1

## 2022-01-30 MED ORDER — PRAVASTATIN SODIUM 10 MG PO TABS
20.0000 mg | ORAL_TABLET | Freq: Every day | ORAL | Status: DC
  Administered 2022-01-30 – 2022-02-03 (×5): 20 mg via ORAL
  Filled 2022-01-30 (×5): qty 2

## 2022-01-30 MED ORDER — GABAPENTIN 100 MG PO CAPS
100.0000 mg | ORAL_CAPSULE | Freq: Three times a day (TID) | ORAL | Status: DC
  Administered 2022-01-30 – 2022-02-03 (×14): 100 mg via ORAL
  Filled 2022-01-30 (×13): qty 1

## 2022-01-30 MED ORDER — ADULT MULTIVITAMIN W/MINERALS CH
1.0000 | ORAL_TABLET | Freq: Every day | ORAL | Status: DC
  Administered 2022-01-30 – 2022-02-03 (×5): 1 via ORAL
  Filled 2022-01-30 (×5): qty 1

## 2022-01-30 MED ORDER — METHYLPREDNISOLONE SODIUM SUCC 40 MG IJ SOLR
40.0000 mg | Freq: Two times a day (BID) | INTRAMUSCULAR | Status: DC
  Administered 2022-01-30 – 2022-02-01 (×5): 40 mg via INTRAVENOUS
  Filled 2022-01-30 (×5): qty 1

## 2022-01-30 MED ORDER — UMECLIDINIUM BROMIDE 62.5 MCG/ACT IN AEPB
1.0000 | INHALATION_SPRAY | Freq: Every day | RESPIRATORY_TRACT | Status: DC
  Administered 2022-01-30 – 2022-02-03 (×5): 1 via RESPIRATORY_TRACT
  Filled 2022-01-30: qty 7

## 2022-01-30 MED ORDER — METHYLPREDNISOLONE SODIUM SUCC 125 MG IJ SOLR
60.0000 mg | Freq: Once | INTRAMUSCULAR | Status: AC
  Administered 2022-01-30: 60 mg via INTRAVENOUS
  Filled 2022-01-30: qty 2

## 2022-01-30 MED ORDER — SODIUM CHLORIDE 0.9 % IV SOLN
500.0000 mg | INTRAVENOUS | Status: AC
  Administered 2022-01-30: 500 mg via INTRAVENOUS
  Filled 2022-01-30: qty 5

## 2022-01-30 MED ORDER — RISAQUAD PO CAPS
2.0000 | ORAL_CAPSULE | Freq: Three times a day (TID) | ORAL | Status: DC
  Administered 2022-01-30 – 2022-02-03 (×11): 2 via ORAL
  Filled 2022-01-30 (×9): qty 2

## 2022-01-30 MED ORDER — ACETAMINOPHEN 325 MG PO TABS
650.0000 mg | ORAL_TABLET | Freq: Four times a day (QID) | ORAL | Status: DC | PRN
  Administered 2022-02-03: 650 mg via ORAL
  Filled 2022-01-30: qty 2

## 2022-01-30 MED ORDER — HEPARIN SODIUM (PORCINE) 5000 UNIT/ML IJ SOLN
5000.0000 [IU] | Freq: Three times a day (TID) | INTRAMUSCULAR | Status: DC
  Administered 2022-01-30 – 2022-02-03 (×14): 5000 [IU] via SUBCUTANEOUS
  Filled 2022-01-30 (×13): qty 1

## 2022-01-30 MED ORDER — SODIUM CHLORIDE 0.9 % IV SOLN: INTRAVENOUS | Status: DC

## 2022-01-30 MED ORDER — MOMETASONE FURO-FORMOTEROL FUM 200-5 MCG/ACT IN AERO
2.0000 | INHALATION_SPRAY | Freq: Two times a day (BID) | RESPIRATORY_TRACT | Status: DC
  Administered 2022-01-30 – 2022-02-03 (×9): 2 via RESPIRATORY_TRACT
  Filled 2022-01-30: qty 8.8

## 2022-01-30 MED ORDER — ZINC OXIDE 20 % EX OINT
TOPICAL_OINTMENT | Freq: Three times a day (TID) | CUTANEOUS | Status: DC | PRN
  Filled 2022-01-30: qty 28.35

## 2022-01-30 MED ORDER — MORPHINE SULFATE (PF) 2 MG/ML IV SOLN
2.0000 mg | INTRAVENOUS | Status: DC | PRN
  Administered 2022-01-30: 2 mg via INTRAVENOUS
  Filled 2022-01-30: qty 1

## 2022-01-30 MED ORDER — PRAMIPEXOLE DIHYDROCHLORIDE 0.25 MG PO TABS
0.1250 mg | ORAL_TABLET | Freq: Every day | ORAL | Status: DC
  Administered 2022-01-30 – 2022-02-02 (×4): 0.125 mg via ORAL
  Filled 2022-01-30: qty 0.5
  Filled 2022-01-30 (×2): qty 1
  Filled 2022-01-30: qty 0.5
  Filled 2022-01-30 (×2): qty 1

## 2022-01-30 MED ORDER — LIDOCAINE VISCOUS HCL 2 % MT SOLN: 15.0000 mL | Freq: Four times a day (QID) | OROMUCOSAL | Status: DC | PRN

## 2022-01-30 MED ORDER — ONDANSETRON HCL 4 MG/2ML IJ SOLN: 4.0000 mg | Freq: Four times a day (QID) | INTRAMUSCULAR | Status: DC | PRN

## 2022-01-30 MED ORDER — ACETAMINOPHEN 650 MG RE SUPP: 650.0000 mg | Freq: Four times a day (QID) | RECTAL | Status: DC | PRN

## 2022-01-30 MED ORDER — ZINC OXIDE 40 % EX OINT
TOPICAL_OINTMENT | Freq: Two times a day (BID) | CUTANEOUS | Status: DC
  Filled 2022-01-30 (×2): qty 57

## 2022-01-30 MED ORDER — IPRATROPIUM-ALBUTEROL 0.5-2.5 (3) MG/3ML IN SOLN
3.0000 mL | Freq: Three times a day (TID) | RESPIRATORY_TRACT | Status: DC
  Administered 2022-01-31 – 2022-02-03 (×11): 3 mL via RESPIRATORY_TRACT
  Filled 2022-01-30 (×11): qty 3

## 2022-01-30 MED ORDER — IPRATROPIUM-ALBUTEROL 0.5-2.5 (3) MG/3ML IN SOLN
3.0000 mL | Freq: Four times a day (QID) | RESPIRATORY_TRACT | Status: DC
  Administered 2022-01-30 (×3): 3 mL via RESPIRATORY_TRACT
  Filled 2022-01-30 (×3): qty 3

## 2022-01-30 MED ORDER — AZITHROMYCIN 250 MG PO TABS
500.0000 mg | ORAL_TABLET | Freq: Every day | ORAL | Status: DC
  Administered 2022-01-31 – 2022-02-02 (×3): 500 mg via ORAL
  Filled 2022-01-30 (×4): qty 2

## 2022-01-30 MED ORDER — LACTATED RINGERS IV SOLN: INTRAVENOUS | Status: DC

## 2022-01-30 MED ORDER — ONDANSETRON HCL 4 MG PO TABS: 4.0000 mg | ORAL_TABLET | Freq: Four times a day (QID) | ORAL | Status: DC | PRN

## 2022-01-30 MED ORDER — ZINC SULFATE 220 (50 ZN) MG PO CAPS
220.0000 mg | ORAL_CAPSULE | Freq: Every day | ORAL | Status: DC
  Administered 2022-01-30 – 2022-02-03 (×5): 220 mg via ORAL
  Filled 2022-01-30 (×5): qty 1

## 2022-01-30 MED ORDER — LIDOCAINE HCL URETHRAL/MUCOSAL 2 % EX GEL
1.0000 | Freq: Once | CUTANEOUS | Status: AC
  Administered 2022-01-30: 1 via TOPICAL
  Filled 2022-01-30: qty 10

## 2022-01-30 NOTE — ED Notes (Signed)
Pt urinated in bedpan. Will place external cath after. Rawness noted all over groin area. A/o. Color wnl. Aware awaiting admission bed. Na. No other needs at this time.

## 2022-01-30 NOTE — Assessment & Plan Note (Signed)
Continue pravastatin 

## 2022-01-30 NOTE — Consult Note (Addendum)
[facility] Nurse Consult Note: Reason for Consult: Consult requested for buttocks/sacrum. Performed remotely after review of progress notes in the EMR.   Pt is noted to have  "red raw areas to buttocks."  Description is consistent with moisture associated skin damage related to incontinence of urine and stool. ICD-10 CM Codes for Irritant Dermatitis Due to fecal, urinary or dual incontinence Dressing procedure/placement/frequency:  Topical treatment orders provided for bedside nurses to repel moisture and promote healing and decrease discomfort: Apply Desitin cream to bilat buttocks BID and PRN when turning and cleaning, then apply antifungal powder over the cream. Please re-consult if further assistance is needed.  Thank-you,  Julien Girt MSN, [place], [place], [place], [place]

## 2022-01-30 NOTE — Assessment & Plan Note (Addendum)
--   Improved remain on room air percent -Shortness of breath with exertion - Counseled on importance of smoking cessation - Continue albuterol and ipratropium - Continue Symbicort and Incruse -IV Solu-Medrol initiated >>. tapering down

## 2022-01-30 NOTE — ED Notes (Signed)
Pharmacy tech just in for med rec.

## 2022-01-30 NOTE — H&P (Signed)
History and Physical    Patient: Rhonda Briggs ZOX:[id number] DOB: 1950-10-12 DOA: 01/29/2022 DOS: the patient was seen and examined on 01/30/2022 PCP: Danna Hefty, DO  Patient coming from: Home  Chief Complaint:  Chief Complaint  Patient presents with   Shortness of Breath   Cough    Covid positive    Diarrhea   HPI: Rhonda Briggs is a 72 y.o. female with medical history significant of anemia, COPD, hyperlipidemia, hypertension, tobacco use disorder, and more presents the ED with a chief complaint of shortness of breath.  Patient initially presented with cough, wheeze, dyspnea with exertion.  She improved with treatment in the ER, and does not remember to discuss that without being prompted.  She reports that her cough is productive of clear sputum.  She denies chest pain.  She reports that she was wheezing, but that seems to improve.  Her cough started about 3 days ago.  It was getting worse since it started.  Her dyspnea was worse on exertion.  She reports she went to one of the med center as needed a throat culture on the third and determined that she did not have strep.  Today her flu a, B, RSV, COVID are negative.  Will continue her treatment for COPD.  When I see her her main concern is her GI symptoms.  She reports she has ulcers in her mouth that started on her lips and moved to her tongue.  This started about 5 days ago.  She has not tried anything for them at home.  So painful that she is unable to eat the crackers that she has at bedside.  She has never had ulcers like that before.  Patient reports that she has had some nausea, vomiting, diarrhea at home.  The nausea and vomiting is 3-4 times a day and is nonbloody.  Associated with abdominal cramping that is intermittent.  She reports she has diarrhea 4-5 times per day.  It is also nonbloody but she does report melena.  FOBT card at bedside for patient's next bowel movement.  Patient reports that she has not eaten anything out of the  ordinary for her.  No one around her is sick.  She denies a fever.  Patient has no other complaints at this time.  Review of systems she does report dysuria which started 3 days ago.  She denies hematuria.  UA ordered at admission  Patient smokes 7-10 cigarettes/day.  She does not drink.  She does not have her COVID-vaccine.  Patient is DNR. Review of Systems: As mentioned in the history of present illness. All other systems reviewed and are negative. Past Medical History:  Diagnosis Date   Anemia    Arthritis    Asthma    COPD (chronic obstructive pulmonary disease) ([place])    History of kidney stones    Hyperlipidemia    Hypertension    Osteoporosis    PONV (postoperative nausea and vomiting)    after hernia surgery   Past Surgical History:  Procedure Laterality Date   BIOPSY  03/18/2021   Procedure: BIOPSY;  Surgeon: Harvel Quale, MD;  Location: [facility];  Service: Gastroenterology;;   Wilmon Pali RELEASE Right 2011   COLONOSCOPY WITH PROPOFOL N/A 03/18/2021   Procedure: COLONOSCOPY WITH PROPOFOL;  Surgeon: Harvel Quale, MD;  Location: [facility];  Service: Gastroenterology;  Laterality: N/A;   COLONOSCOPY WITH PROPOFOL N/A 06/06/2021   Procedure: COLONOSCOPY WITH PROPOFOL;  Surgeon: Eloise Harman, DO;  Location: [facility];  Service: Endoscopy;  Laterality: N/A;  2:00pm   ESOPHAGOGASTRODUODENOSCOPY (EGD) WITH PROPOFOL N/A 03/18/2021   Procedure: ESOPHAGOGASTRODUODENOSCOPY (EGD) WITH PROPOFOL;  Surgeon: Harvel Quale, MD;  Location: [facility];  Service: Gastroenterology;  Laterality: N/A;   ESOPHAGOGASTRODUODENOSCOPY (EGD) WITH PROPOFOL N/A 06/06/2021   Procedure: ESOPHAGOGASTRODUODENOSCOPY (EGD) WITH PROPOFOL;  Surgeon: Eloise Harman, DO;  Location: [facility];  Service: Endoscopy;  Laterality: N/A;   GIVENS CAPSULE STUDY N/A 03/19/2021   Procedure: GIVENS CAPSULE STUDY;  Surgeon: Rogene Houston, MD;  Location: [facility];  Service: Endoscopy;  Laterality: N/A;   HERNIA REPAIR Left    inguinal   TOTAL KNEE ARTHROPLASTY Right 08/19/2016   Procedure: TOTAL KNEE ARTHROPLASTY;  Surgeon: Carole Civil, MD;  Location: [facility];  Service: Orthopedics;  Laterality: Right;   TOTAL KNEE ARTHROPLASTY Left 12/31/2016   Procedure: TOTAL KNEE ARTHROPLASTY;  Surgeon: Carole Civil, MD;  Location: [facility];  Service: Orthopedics;  Laterality: Left;   TUBAL LIGATION     Social History:  reports that she has been smoking cigarettes. She has a 24.00 pack-year smoking history. She has never used smokeless tobacco. She reports that she does not drink alcohol and does not use drugs.  Allergies  Allergen Reactions   Amoxicillin Nausea And Vomiting    Family History  Problem Relation Age of Onset   Hypertension Mother    Lung disease Mother    Cancer Father    Urticaria Neg Hx    Eczema Neg Hx    Immunodeficiency Neg Hx    Atopy Neg Hx    Asthma Neg Hx    Angioedema Neg Hx    Allergic rhinitis Neg Hx     Prior to Admission medications   Medication Sig Start Date End Date Taking? Authorizing Provider  Bismuth 262 MG CHEW Chew 2 each by mouth every 6 (six) hours. Patient not taking: Reported on 05/28/2021 03/24/21   Montez Morita, Quillian Quince, MD  budesonide-formoterol [facility]) 160-4.5 MCG/ACT inhaler 2 puffs daily.    [provider]  ferrous gluconate (FERGON) 324 MG tablet Take 324 mg by mouth daily with breakfast.    [provider]  folic acid (FOLVITE) 1 MG tablet Take 1 tablet (1 mg total) by mouth daily. 03/20/21   Roxan Hockey, MD  gabapentin (NEURONTIN) 100 MG capsule Take 100 mg by mouth in the morning, at noon, and at bedtime. 02/19/21   [provider]  INCRUSE ELLIPTA 62.5 MCG/ACT AEPB Inhale 1 puff into the lungs daily. 04/25/21   [provider]  Multiple Vitamins-Minerals (CENTRUM SILVER 50+WOMEN PO) Take 1 tablet by mouth daily in the afternoon. With  vitamin D    [provider]  neomycin-polymyxin b-dexamethasone (MAXITROL) 3.5-10000-0.1 OINT Place 1 application into the right eye at bedtime. 03/06/21   [provider]  nicotine (NICODERM CQ - DOSED IN MG/24 HOURS) 14 mg/24hr patch Place 1 patch (14 mg total) onto the skin daily. 03/21/21   Roxan Hockey, MD  pantoprazole (PROTONIX) 40 MG tablet Take 1 tablet (40 mg total) by mouth 2 (two) times daily. Patient taking differently: Take 40 mg by mouth daily. 03/20/21 03/20/22  Roxan Hockey, MD  polyethylene glycol-electrolytes (TRILYTE) 420 g solution Take 4,000 mLs by mouth as directed. 04/28/21   Eloise Harman, DO  pramipexole (MIRAPEX) 0.125 MG tablet Take 0.125 mg by mouth at bedtime.    [provider]  pravastatin (PRAVACHOL) 20 MG tablet  Take 20 mg by mouth daily.    [provider]  predniSONE (DELTASONE) 10 MG tablet Take 10 mg by mouth daily. 05/26/21   [provider]  VENTOLIN HFA 108 (90 Base) MCG/ACT inhaler Inhale 2 puffs into the lungs every 6 (six) hours as needed for wheezing or shortness of breath. 03/20/21   Roxan Hockey, MD    Physical Exam: Vitals:   01/30/22 0131 01/30/22 0200 01/30/22 0315 01/30/22 0601  BP:  112/64 (!) 117/49   Pulse:  89 94   Resp:  19 17   Temp: 98.4 F (36.9 C)   98.3 F (36.8 C)  TempSrc: Oral   Oral  SpO2:  96% 96%   Weight:      Height:       1.  General: Patient lying left lateral decubitus,  no acute distress   2. Psychiatric: Alert and oriented x 3, mood is irritable and behavior is normal for situation, pleasant and cooperative with exam   3. Neurologic: Speech and language are normal, face is symmetric, moves all 4 extremities voluntarily, at baseline without acute deficits on limited exam   4. HEENMT:  Head is atraumatic, normocephalic, pupils reactive to light, neck is supple, trachea is midline, mucous membranes are moist   5. Respiratory : Lungs are clear to  auscultation bilaterally without wheezing, rhonchi, rales, no cyanosis, no increase in work of breathing or accessory muscle use   6. Cardiovascular : Heart rate normal, rhythm is regular, no murmurs, rubs or gallops, no peripheral edema, peripheral pulses palpated   7. Gastrointestinal:  Abdomen is soft, nondistended, nontender to palpation bowel sounds active, no masses or organomegaly palpated   8. Skin:  Skin is warm, dry and intact without rashes, acute lesions, or ulcers on limited exam   9.Musculoskeletal:  No acute deformities or trauma, no asymmetry in tone, no peripheral edema, peripheral pulses palpated, no tenderness to palpation in the extremities  Data Reviewed: In the ED Temp 98.2, heart rate 89-105, respiratory rate 14-23, blood pressure 112/48-145/76, satting 96% Leukopenic with a white blood cell count of 3.1, hemoglobin 12.2 Hyponatremic with a sodium of 132, elevated creatinine 2.09 Negative COVID and flu Chest x-ray close no acute disease GI panel pending, C. difficile PCR pending EKG shows a heart rate of 104, sinus tach, QTc 449 Admission requested for further management of dehydration workup this intractable diarrhea  Assessment and Plan: * AKI (acute kidney injury) ([place]) - Creatinine 1.14>> 2.09 - Prerenal in the setting of dehydration secondary to GI losses - Hold nephrotoxic agents when possible - Continue IV fluids - Trend creatinine in the a.m.   Diarrhea - Reports diarrhea for several days - Patient skin is raw from diarrhea - Wound consult - GI pathogen and C. difficile pending - No diarrhea since being in the ER, if it should start again, consider adding CT abdomen pelvis - Continue to monitor  Hyperlipidemia - Continue pravastatin  Tobacco use disorder - Counseled on importance of cessation - Patient reports she has not had a cigarette since symptoms started, some issues while on her road to recovery at this time  COPD with acute  exacerbation ([place]) - Counseled on importance of smoking cessation - Initially presented with wheeze, rhonchi, much improved with neb - Continue albuterol and ipratropium - Continue Symbicort and Incruse - Continue to monitor      Advance Care Planning:   Code Status: DNR  Consults: None  Family Communication: Young lady sleeping at  bedside  Severity of Illness: The appropriate patient status for this patient is OBSERVATION. Observation status is judged to be reasonable and necessary in order to provide the required intensity of service to ensure the patient's safety. The patient's presenting symptoms, physical exam findings, and initial radiographic and laboratory data in the context of their medical condition is felt to place them at decreased risk for further clinical deterioration. Furthermore, it is anticipated that the patient will be medically stable for discharge from the hospital within 2 midnights of admission.   Author: Rolla Plate, DO 01/30/2022 6:18 AM  For on call review [email or web address].

## 2022-01-30 NOTE — ED Notes (Signed)
Pt has severe skin breakdown/diaper rash to her sacrum, red and inflamed, painful to touch. Noted pt had dried stool to her bottom and hair to her peri area. Pt was cleaned, underwear removed so area my dry out, lido cream and skin barrier applied for comfort. Pt and daughter educated on peri care and good peri hygiene. Both verbalized understanding.

## 2022-01-30 NOTE — Progress Notes (Signed)
PROGRESS NOTE    Patient: Rhonda Briggs                            PCP: Mina Marble P, DO                    DOB: May 07, 1950            DOA: 01/29/2022 URK:[id number]             DOS: 01/30/2022, 11:06 AM   LOS: 0 days   Date of Service: The patient was seen and examined on 01/30/2022  Subjective:   The patient was seen and examined this morning had multiple complaints including shortness of breath, COPD, recent diagnosis of COVID, diarrhea, dysuria.  Otherwise laying in bed comfortably Hemodynamically stable, on room air  Brief Narrative:   Rhonda Briggs is a 72 y.o. female with medical history significant of anemia, COPD, hyperlipidemia, hypertension, tobacco use disorder, and more presents the ED with a chief complaint of shortness of breath.  Patient initially presented with cough, wheeze, dyspnea with exertion.  She improved with treatment in the ER, and does not remember to discuss that without being prompted.  She reports that her cough is productive of clear sputum.  She denies chest pain.  She reports that she was wheezing, but that seems to improve.  Her cough started about 3 days ago.  It was getting worse since it started.  Her dyspnea was worse on exertion.  She reports she went to one of the med center as needed a throat culture on the third and determined that she did not have strep.  Today her flu a, B, RSV, COVID are negative.  Will continue her treatment for COPD.   She is Concern is her GI symptoms.  She reports she has ulcers in her mouth that started on her lips and moved to her tongue.  This started about 5 days ago.  She has not tried anything for them at home.  So painful that she is unable to eat the crackers that she has at bedside.  She has never had ulcers like that before.  Patient reports that she has had some nausea, vomiting, diarrhea at home.  The nausea and vomiting is 3-4 times a day and is nonbloody.  Associated with abdominal cramping that is intermittent.   She reports she has diarrhea 4-5 times per day.  It is also nonbloody but she does report melena.  FOBT card at bedside for patient's next bowel movement.  Patient reports that she has not eaten anything out of the ordinary for her.  No one around her is sick.  She denies a fever.  Patient has no other complaints at this time.   Review of systems she does report dysuria which started 3 days ago.  She denies hematuria.  UA ordered at admission   Patient smokes 7-10 cigarettes/day.  She does not drink.  She does not have her COVID-vaccine.  Patient is DNR.  ED: Temp 98.2, HR 89-105, RR 14-23, blood pressure 112/48-145/76, satting 96% Leukopenic with a white blood cell count of 3.1, hemoglobin 12.2 Hyponatremic with a sodium of 132, elevated creatinine 2.09 Negative COVID and flu Chest x-ray close no acute disease GI panel pending, C. difficile PCR pending EKG shows a heart rate of 104, sinus tach, QTc 449 Admission requested for further management of dehydration workup this intractable diarrhea    Assessment & Plan:  Principal Problem:   AKI (acute kidney injury) ([place]) Active Problems:   COPD with acute exacerbation ([facility])   Tobacco use disorder   Hyperlipidemia   Diarrhea     Assessment and Plan: * AKI (acute kidney injury) ([place]) - Creatinine 1.14>> 2.09 - Prerenal in the setting of dehydration secondary to GI losses - Hold nephrotoxic agents when possible - Continue IV fluids Lab Results  Component Value Date   CREATININE 1.99 (H) 01/30/2022   CREATININE 2.09 (H) 01/29/2022   CREATININE 1.14 (H) 06/04/2021      Diarrhea - Reports diarrhea for several days - Patient skin is raw from diarrhea - Wound consult - GI pathogen and C. difficile pending - No diarrhea since being in the ER, if it should start again, consider adding CT abdomen pelvis - Groin skin rash because of diarrhea, raw-Desitin, zinc oxide cream ordered  Hyperlipidemia - Continue  pravastatin  Tobacco use disorder - Counseled on importance of cessation - Patient reports she has not had a cigarette since symptoms started, some issues while on her road to recovery at this time  COPD with acute exacerbation ([place]) - Counseled on importance of smoking cessation - Initially presented with wheeze, rhonchi, much improved with neb - Continue albuterol and ipratropium - Continue Symbicort and Incruse -IV Solu-Medrol initiated -Currently on room air, satting 97% -Cough and shortness of breath with minimal exertion  -------------------------------------------------------------------------------------------------------------------------------------  DVT prophylaxis:  heparin injection 5,000 Units Start: 01/30/22 0600 SCDs Start: 01/30/22 0436   Code Status:   Code Status: DNR  Family Communication: Daughter at bedside-updated The above findings and plan of care has been discussed with patient (and family)  in detail,  they expressed understanding and agreement of above. -Advance care planning has been discussed.   Admission status:   Status is: Observation The patient remains OBS appropriate and will d/c before 2 midnights.     Procedures:   No admission procedures for hospital encounter.   Antimicrobials:  Anti-infectives (From admission, onward)    Start     Dose/Rate Route Frequency Ordered Stop   01/31/22 0445  azithromycin (ZITHROMAX) tablet 500 mg       See Hyperspace for full Linked Orders Report.   500 mg Oral Daily 01/30/22 0435 02/04/22 0959   01/30/22 0435  azithromycin (ZITHROMAX) 500 mg in sodium chloride 0.9 % 250 mL IVPB       See Hyperspace for full Linked Orders Report.   500 mg 250 mL/hr over 60 Minutes Intravenous Every 24 hours 01/30/22 0435 01/30/22 0622        Medication:   acidophilus  2 capsule Oral TID   [START ON 01/31/2022] azithromycin  500 mg Oral Daily   gabapentin  100 mg Oral TID   heparin  5,000 Units Subcutaneous  Q8H   ipratropium-albuterol  3 mL Nebulization Q6H   liver oil-zinc oxide   Topical BID   methylPREDNISolone (SOLU-MEDROL) injection  40 mg Intravenous Q12H   mometasone-formoterol  2 puff Inhalation BID   multivitamin with minerals  1 tablet Oral Daily   pramipexole  0.125 mg Oral QHS   pravastatin  20 mg Oral Daily   umeclidinium bromide  1 puff Inhalation Daily   zinc sulfate  220 mg Oral Daily    acetaminophen **OR** acetaminophen, albuterol, lidocaine, morphine injection, ondansetron **OR** ondansetron (ZOFRAN) IV, oxyCODONE, zinc oxide   Objective:   Vitals:   01/30/22 0315 01/30/22 0600 01/30/22 0601 01/30/22 0801  BP: (!) 117/49 (!) 99/46  Pulse: 94 85    Resp: 17 13    Temp:   98.3 F (36.8 C)   TempSrc:   Oral   SpO2: 96% 95%  93%  Weight:      Height:        Intake/Output Summary (Last 24 hours) at 01/30/2022 1106 Last data filed at 01/30/2022 6948 Gross per 24 hour  Intake 1250 ml  Output --  Net 1250 ml   Filed Weights   01/29/22 2103  Weight: 66.7 kg     Examination:   Physical Exam  Constitution:  Alert, cooperative, no distress,  Appears calm and comfortable  Psychiatric:   Normal and stable mood and affect, cognition intact,   HEENT:        Normocephalic, PERRL, otherwise with in Normal limits  Chest:         Chest symmetric Cardio vascular:  S1/S2, RRR, No murmure, No Rubs or Gallops  pulmonary: Positive breath sounds diffusely, diffuse wheezing, rhonchi negative for any crackles Abdomen: Soft, non-tender, non-distended, bowel sounds,no masses, no organomegaly Muscular skeletal: Limited exam - in bed, able to move all 4 extremities,   Neuro: CNII-XII intact. , normal motor and sensation, reflexes intact  Extremities: No pitting edema lower extremities, +2 pulses  Skin: Dry, warm to touch, No open wounds Wounds: per nursing  documentation   ------------------------------------------------------------------------------------------------------------------------------------------    LABs:     Latest Ref Rng & Units 01/30/2022    5:03 AM 01/29/2022    9:06 PM 06/04/2021   10:45 AM  CBC  WBC 4.0 - 10.5 K/uL 2.0  3.1  16.1   Hemoglobin 12.0 - 15.0 g/dL 10.1  12.2  12.2   Hematocrit 36.0 - 46.0 % 32.3  38.0  39.8   Platelets 150 - 400 K/uL 248  327  507       Latest Ref Rng & Units 01/30/2022    5:03 AM 01/29/2022    9:06 PM 06/04/2021   10:45 AM  CMP  Glucose 70 - 99 mg/dL 139  152  112   BUN 8 - 23 mg/dL 62  65  20   Creatinine 0.44 - 1.00 mg/dL 1.99  2.09  1.14   Sodium 135 - 145 mmol/L 135  132  137   Potassium 3.5 - 5.1 mmol/L 3.5  3.7  4.4   Chloride 98 - 111 mmol/L 97  92  102   CO2 22 - 32 mmol/L '27  28  26   '$ Calcium 8.9 - 10.3 mg/dL 8.3  8.9  9.0   Total Protein 6.5 - 8.1 g/dL 5.9     Total Bilirubin 0.3 - 1.2 mg/dL 1.2     Alkaline Phos 38 - 126 U/L 56     AST 15 - 41 U/L 10     ALT 0 - 44 U/L 9          Micro Results Recent Results (from the past 240 hour(s))  Resp panel by RT-PCR (RSV, Flu A&B, Covid) Anterior Nasal Swab     Status: None   Collection Time: 01/29/22  9:13 PM   Specimen: Anterior Nasal Swab  Result Value Ref Range Status   SARS Coronavirus 2 by RT PCR NEGATIVE NEGATIVE Final    Comment: (NOTE) SARS-CoV-2 target nucleic acids are NOT DETECTED.  The SARS-CoV-2 RNA is generally detectable in upper respiratory specimens during the acute phase of infection. The lowest concentration of SARS-CoV-2 viral copies this assay can detect is 138 copies/mL. A  negative result does not preclude SARS-Cov-2 infection and should not be used as the sole basis for treatment or other patient management decisions. A negative result may occur with  improper specimen collection/handling, submission of specimen other than nasopharyngeal swab, presence of viral mutation(s) within the areas  targeted by this assay, and inadequate number of viral copies(<138 copies/mL). A negative result must be combined with clinical observations, patient history, and epidemiological information. The expected result is Negative.  Fact Sheet for Patients:  EntrepreneurPulse.com.au  Fact Sheet for Healthcare Providers:  IncredibleEmployment.be  This test is no t yet approved or cleared by the [place] FDA and  has been authorized for detection and/or diagnosis of SARS-CoV-2 by FDA under an Emergency Use Authorization (EUA). This EUA will remain  in effect (meaning this test can be used) for the duration of the COVID-19 declaration under Section 564(b)(1) of the Act, 21 U.S.C.section 360bbb-3(b)(1), unless the authorization is terminated  or revoked sooner.       Influenza A by PCR NEGATIVE NEGATIVE Final   Influenza B by PCR NEGATIVE NEGATIVE Final    Comment: (NOTE) The Xpert Xpress SARS-CoV-2/FLU/RSV plus assay is intended as an aid in the diagnosis of influenza from Nasopharyngeal swab specimens and should not be used as a sole basis for treatment. Nasal washings and aspirates are unacceptable for Xpert Xpress SARS-CoV-2/FLU/RSV testing.  Fact Sheet for Patients: EntrepreneurPulse.com.au  Fact Sheet for Healthcare Providers: IncredibleEmployment.be  This test is not yet approved or cleared by the [place] FDA and has been authorized for detection and/or diagnosis of SARS-CoV-2 by FDA under an Emergency Use Authorization (EUA). This EUA will remain in effect (meaning this test can be used) for the duration of the COVID-19 declaration under Section 564(b)(1) of the Act, 21 U.S.C. section 360bbb-3(b)(1), unless the authorization is terminated or revoked.     Resp Syncytial Virus by PCR NEGATIVE NEGATIVE Final    Comment: (NOTE) Fact Sheet for  Patients: EntrepreneurPulse.com.au  Fact Sheet for Healthcare Providers: IncredibleEmployment.be  This test is not yet approved or cleared by the [place] FDA and has been authorized for detection and/or diagnosis of SARS-CoV-2 by FDA under an Emergency Use Authorization (EUA). This EUA will remain in effect (meaning this test can be used) for the duration of the COVID-19 declaration under Section 564(b)(1) of the Act, 21 U.S.C. section 360bbb-3(b)(1), unless the authorization is terminated or revoked.  Performed at [facility], [address] [place], [place] [address]     Radiology Reports DG Chest 2 View  Result Date: 01/29/2022 CLINICAL DATA:  Shortness of breath history of COVID EXAM: CHEST - 2 VIEW COMPARISON:  CT 03/17/2021 FINDINGS: Emphysema with bronchitic changes. No acute consolidation or effusion. Normal cardiac size. No pneumothorax. IMPRESSION: No active cardiopulmonary disease. Emphysema with bronchitic changes. Electronically Signed   By: Donavan Foil M.D.   On: 01/29/2022 21:39    SIGNED: Deatra James, MD, FHM. [facility],  Pager (please use [email or web address] to page/text) Please use Epic Secure Chat for non-urgent communication (7AM-7PM)  If 7PM-7AM, please contact night-coverage [email or web address], 01/30/2022, 11:06 AM

## 2022-01-30 NOTE — ED Notes (Signed)
Seen by Hospitalist. No changed. Waiting for pharmacy to verify meds.

## 2022-01-30 NOTE — Progress Notes (Signed)
Patient is currently getting breathing treatment.  Daughter at bedside, RN with patient.  Patient's BS very diminished.  Sat at 97% on RA.

## 2022-01-30 NOTE — Hospital Course (Addendum)
Rhonda Briggs is a 72 y.o. female with medical history significant of anemia, COPD, hyperlipidemia, hypertension, tobacco use disorder, and more presents the ED with a chief complaint of shortness of breath.  Patient initially presented with cough, wheeze, dyspnea with exertion.  She improved with treatment in the ER, and does not remember to discuss that without being prompted.  She reports that her cough is productive of clear sputum.  She denies chest pain.  She reports that she was wheezing, but that seems to improve.  Her cough started about 3 days ago.  It was getting worse since it started.  Her dyspnea was worse on exertion.  She reports she went to one of the med center as needed a throat culture on the third and determined that she did not have strep.  Today her flu a, B, RSV, COVID are negative.  Will continue her treatment for COPD.   She is Concern is her GI symptoms.  She reports she has ulcers in her mouth that started on her lips and moved to her tongue.  This started about 5 days ago.  She has not tried anything for them at home.  So painful that she is unable to eat the crackers that she has at bedside.  She has never had ulcers like that before.  Patient reports that she has had some nausea, vomiting, diarrhea at home.  The nausea and vomiting is 3-4 times a day and is nonbloody.  Associated with abdominal cramping that is intermittent.  She reports she has diarrhea 4-5 times per day.  It is also nonbloody but she does report melena.  FOBT card at bedside for patient's next bowel movement.  Patient reports that she has not eaten anything out of the ordinary for her.  No one around her is sick.  She denies a fever.  Patient has no other complaints at this time.   Review of systems she does report dysuria which started 3 days ago.  She denies hematuria.  UA ordered at admission   Patient smokes 7-10 cigarettes/day.  She does not drink.  She does not have her COVID-vaccine.  Patient is  DNR.  ED: Temp 98.2, HR 89-105, RR 14-23, blood pressure 112/48-145/76, satting 96% Leukopenic with a white blood cell count of 3.1, hemoglobin 12.2 Hyponatremic with a sodium of 132, elevated creatinine 2.09 Negative COVID and flu Chest x-ray close no acute disease GI panel pending, C. difficile PCR pending EKG shows a heart rate of 104, sinus tach, QTc 449 Admission requested for further management of dehydration workup this intractable diarrhea

## 2022-01-30 NOTE — Assessment & Plan Note (Addendum)
-   Much improved  - creatinine 1.14>> 2.09 >> 1.13 , 1.25 - Prerenal in the setting of dehydration secondary to GI losses - Hold nephrotoxic agents when possible - IV  fluids... Discontinued  Lab Results  Component Value Date   CREATININE 1.25 (H) 02/02/2022   CREATININE 1.17 (H) 02/01/2022   CREATININE 1.13 (H) 01/31/2022

## 2022-01-30 NOTE — Assessment & Plan Note (Addendum)
-   Resolved -Reported 1 loose stool overnight - Patient skin is raw from diarrhea - Wound consult - GI pathogen and C. difficile pending - No diarrhea since being in the ER, - Groin skin rash because of diarrhea, raw-Desitin, zinc oxide cream ordered

## 2022-01-30 NOTE — TOC Progression Note (Signed)
  Transition of Care [facility]) Screening Note   Patient Details  Name: [name] Date of Birth: [birth year]   Transition of Care [facility]) CM/SW Contact:    Shade Flood, LCSW Phone Number: 01/30/2022, 11:53 AM    Transition of Care Department [facility]) has reviewed patient and no TOC needs have been identified at this time. We will continue to monitor patient advancement through interdisciplinary progression rounds. If new patient transition needs arise, please place a TOC consult.

## 2022-01-30 NOTE — Assessment & Plan Note (Signed)
-   Counseled on importance of cessation - Patient reports she has not had a cigarette since symptoms started, some issues while on her road to recovery at this time

## 2022-01-31 DIAGNOSIS — D61818 Other pancytopenia: Secondary | ICD-10-CM | POA: Diagnosis present

## 2022-01-31 DIAGNOSIS — E876 Hypokalemia: Secondary | ICD-10-CM | POA: Diagnosis not present

## 2022-01-31 DIAGNOSIS — N179 Acute kidney failure, unspecified: Secondary | ICD-10-CM | POA: Diagnosis not present

## 2022-01-31 LAB — COMPREHENSIVE METABOLIC PANEL
ALT: 11 U/L (ref 0–44)
AST: 12 U/L — ABNORMAL LOW (ref 15–41)
Albumin: 2.6 g/dL — ABNORMAL LOW (ref 3.5–5.0)
Alkaline Phosphatase: 45 U/L (ref 38–126)
Anion gap: 10 (ref 5–15)
BUN: 34 mg/dL — ABNORMAL HIGH (ref 8–23)
CO2: 27 mmol/L (ref 22–32)
Calcium: 8.4 mg/dL — ABNORMAL LOW (ref 8.9–10.3)
Chloride: 99 mmol/L (ref 98–111)
Creatinine, Ser: 1.13 mg/dL — ABNORMAL HIGH (ref 0.44–1.00)
GFR, Estimated: 52 mL/min — ABNORMAL LOW (ref >60)
Glucose, Bld: 150 mg/dL — ABNORMAL HIGH (ref 70–99)
Potassium: 3.4 mmol/L — ABNORMAL LOW (ref 3.5–5.1)
Sodium: 136 mmol/L (ref 135–145)
Total Bilirubin: 0.9 mg/dL (ref 0.3–1.2)
Total Protein: 5.4 g/dL — ABNORMAL LOW (ref 6.5–8.1)

## 2022-01-31 LAB — CBC
HCT: 29.9 % — ABNORMAL LOW (ref 36.0–46.0)
Hemoglobin: 9.3 g/dL — ABNORMAL LOW (ref 12.0–15.0)
MCH: 26.1 pg (ref 26.0–34.0)
MCHC: 31.1 g/dL (ref 30.0–36.0)
MCV: 83.8 fL (ref 80.0–100.0)
Platelets: 193 10*3/uL (ref 150–400)
RBC: 3.57 MIL/uL — ABNORMAL LOW (ref 3.87–5.11)
RDW: 15.9 % — ABNORMAL HIGH (ref 11.5–15.5)
WBC: 1.5 10*3/uL — ABNORMAL LOW (ref 4.0–10.5)
nRBC: 0 % (ref 0.0–0.2)

## 2022-01-31 NOTE — Assessment & Plan Note (Addendum)
-   Likely exacerbated by upper respiratory-viral infection -Monitoring closely     Latest Ref Rng & Units 02/02/2022    6:33 AM 02/01/2022    5:01 AM 01/31/2022    7:37 AM  CBC  WBC 4.0 - 10.5 K/uL 1.8  1.0  1.5   Hemoglobin 12.0 - 15.0 g/dL 10.3  8.9  9.3   Hematocrit 36.0 - 46.0 % 32.9  27.9  29.9   Platelets 150 - 400 K/uL 169  161  193    Iron/TIBC/Ferritin/ %Sat    Component Value Date/Time   IRON 21 (L) 02/01/2022 0501   TIBC 211 (L) 02/01/2022 0501   IRONPCTSAT 10 (L) 02/01/2022 0501   -Initiating IV iron x 1 then supplement iron p.o. -Also low B12, initiating subcu B12 x 110 initiating oral

## 2022-01-31 NOTE — Progress Notes (Signed)
PROGRESS NOTE    Patient: Rhonda Briggs                            PCP: Danna Hefty, DO                    DOB: 1950-03-29            DOA: 01/29/2022 IRW:[id number]             DOS: 01/31/2022, 12:04 PM   LOS: 1 day   Date of Service: The patient was seen and examined on 01/31/2022  Subjective:   The patient was seen and examined this morning, sitting on the side of the bed, receiving breathing treatment Still complaining shortness of breath, cough  -Reporting no bowel movement since last night  POA w multiple complaints including shortness of breath, COPD, recent diagnosis of COVID, diarrhea, dysuria.   Brief Narrative:   Rhonda Briggs is a 72 y.o. female with medical history significant of anemia, COPD, hyperlipidemia, hypertension, tobacco use disorder, and more presents the ED with a chief complaint of shortness of breath.  Patient initially presented with cough, wheeze, dyspnea with exertion.  She improved with treatment in the ER, and does not remember to discuss that without being prompted.  She reports that her cough is productive of clear sputum.  She denies chest pain.  She reports that she was wheezing, but that seems to improve.  Her cough started about 3 days ago.  It was getting worse since it started.  Her dyspnea was worse on exertion.  She reports she went to one of the med center as needed a throat culture on the third and determined that she did not have strep.  Today her flu a, B, RSV, COVID are negative.  Will continue her treatment for COPD.   She is Concern is her GI symptoms.  She reports she has ulcers in her mouth that started on her lips and moved to her tongue.  This started about 5 days ago.  She has not tried anything for them at home.  So painful that she is unable to eat the crackers that she has at bedside.  She has never had ulcers like that before.  Patient reports that she has had some nausea, vomiting, diarrhea at home.  The nausea and vomiting is 3-4  times a day and is nonbloody.  Associated with abdominal cramping that is intermittent.  She reports she has diarrhea 4-5 times per day.  It is also nonbloody but she does report melena.  FOBT card at bedside for patient's next bowel movement.  Patient reports that she has not eaten anything out of the ordinary for her.  No one around her is sick.  She denies a fever.  Patient has no other complaints at this time.   Review of systems she does report dysuria which started 3 days ago.  She denies hematuria.  UA ordered at admission   Patient smokes 7-10 cigarettes/day.  She does not drink.  She does not have her COVID-vaccine.  Patient is DNR.  ED: Temp 98.2, HR 89-105, RR 14-23, blood pressure 112/48-145/76, satting 96% Leukopenic with a white blood cell count of 3.1, hemoglobin 12.2 Hyponatremic with a sodium of 132, elevated creatinine 2.09 Negative COVID and flu Chest x-ray close no acute disease GI panel pending, C. difficile PCR pending EKG shows a heart rate of 104, sinus tach, QTc 449 Admission  requested for further management of dehydration workup this intractable diarrhea    Assessment & Plan:   Principal Problem:   AKI (acute kidney injury) ([place]) Active Problems:   Hypokalemia   Pancytopenia (HCC)   COPD with acute exacerbation (HCC)   Tobacco use disorder   Hyperlipidemia   Diarrhea   SOB (shortness of breath)     Assessment and Plan: * AKI (acute kidney injury) ([place]) - Creatinine 1.14>> 2.09 >> 1.13  - Prerenal in the setting of dehydration secondary to GI losses - Hold nephrotoxic agents when possible - Continue IV fluids Lab Results  Component Value Date   CREATININE 1.13 (H) 01/31/2022   CREATININE 1.99 (H) 01/30/2022   CREATININE 2.09 (H) 01/29/2022      Pancytopenia ([place]) - Likely exacerbated by upper respiratory-viral infection -Monitoring closely     Latest Ref Rng & Units 01/31/2022    7:37 AM 01/30/2022    5:03 AM 01/29/2022    9:06 PM  CBC   WBC 4.0 - 10.5 K/uL 1.5  2.0  3.1   Hemoglobin 12.0 - 15.0 g/dL 9.3  10.1  12.2   Hematocrit 36.0 - 46.0 % 29.9  32.3  38.0   Platelets 150 - 400 K/uL 193  248  327      Hypokalemia - Monitoring repleting orally -Checking magnesium level  SOB (shortness of breath) - Exacerbated by underlying COPD, likely upper respiratory infection-viral -Treating underlying causes, continue DuoNeb treatment, supplemental oxygen as needed -Mucolytic's   Diarrhea - Reports diarrhea for several days -Reported 1 loose stool overnight - Patient skin is raw from diarrhea - Wound consult - GI pathogen and C. difficile pending - No diarrhea since being in the ER, - Groin skin rash because of diarrhea, raw-Desitin, zinc oxide cream ordered  Hyperlipidemia - Continue pravastatin  Tobacco use disorder - Counseled on importance of cessation - Patient reports she has not had a cigarette since symptoms started, some issues while on her road to recovery at this time  COPD with acute exacerbation ([place]) - Counseled on importance of smoking cessation - Continue albuterol and ipratropium - Continue Symbicort and Incruse -IV Solu-Medrol initiated -Currently on room air, satting 97% -Cough and shortness of breath with minimal exertion   -------------------------------------------------------------------------------------------------------------------------------------  DVT prophylaxis:  heparin injection 5,000 Units Start: 01/30/22 0600 SCDs Start: 01/30/22 0436   Code Status:   Code Status: DNR  Family Communication: Daughter at bedside-updated The above findings and plan of care has been discussed with patient (and family)  in detail,  they expressed understanding and agreement of above. -Advance care planning has been discussed.   Admission status:   Status is: Observation The patient remains OBS appropriate and will d/c before 2 midnights.     Procedures:   No admission procedures  for hospital encounter.   Antimicrobials:  Anti-infectives (From admission, onward)    Start     Dose/Rate Route Frequency Ordered Stop   01/31/22 0445  azithromycin (ZITHROMAX) tablet 500 mg       See Hyperspace for full Linked Orders Report.   500 mg Oral Daily 01/30/22 0435 02/04/22 0959   01/30/22 0435  azithromycin (ZITHROMAX) 500 mg in sodium chloride 0.9 % 250 mL IVPB       See Hyperspace for full Linked Orders Report.   500 mg 250 mL/hr over 60 Minutes Intravenous Every 24 hours 01/30/22 0435 01/30/22 0622        Medication:   acidophilus  2 capsule Oral TID  azithromycin  500 mg Oral Daily   gabapentin  100 mg Oral TID   heparin  5,000 Units Subcutaneous Q8H   ipratropium-albuterol  3 mL Nebulization TID   liver oil-zinc oxide   Topical BID   methylPREDNISolone (SOLU-MEDROL) injection  40 mg Intravenous Q12H   mometasone-formoterol  2 puff Inhalation BID   multivitamin with minerals  1 tablet Oral Daily   pramipexole  0.125 mg Oral QHS   pravastatin  20 mg Oral Daily   umeclidinium bromide  1 puff Inhalation Daily   zinc sulfate  220 mg Oral Daily    acetaminophen **OR** acetaminophen, albuterol, lidocaine, morphine injection, ondansetron **OR** ondansetron (ZOFRAN) IV, oxyCODONE, zinc oxide   Objective:   Vitals:   01/30/22 1943 01/30/22 2038 01/31/22 0534 01/31/22 0853  BP:  (!) 120/43 (!) 154/75   Pulse:  100 (!) 104   Resp:  18 20   Temp:  98 F (36.7 C) 98.1 F (36.7 C)   TempSrc:  Oral Oral   SpO2: 92% 94% 97% 94%  Weight:      Height:        Intake/Output Summary (Last 24 hours) at 01/31/2022 1204 Last data filed at 01/31/2022 0300 Gross per 24 hour  Intake 2536.08 ml  Output --  Net 2536.08 ml   Filed Weights   01/29/22 2103 01/30/22 1300  Weight: 66.7 kg 70.7 kg     Examination:     General:  AAO x 3,  cooperative, still complaining shortness of breath, cough  HEENT:  Normocephalic, PERRL, otherwise with in Normal limits   Neuro:   CNII-XII intact. , normal motor and sensation, reflexes intact   Lungs:   Clear to auscultation BL, Respirations unlabored,  No wheezes / crackles  Cardio:    S1/S2, RRR, No murmure, No Rubs or Gallops   Abdomen:  Soft, non-tender, bowel sounds active all four quadrants, no guarding or peritoneal signs.  Muscular  skeletal:  Limited exam -global generalized weaknesses - in bed, able to move all 4 extremities,   2+ pulses,  symmetric, No pitting edema  Skin:  Dry, warm to touch, raw skin in the groin area  Wounds: Please see nursing documentation         ------------------------------------------------------------------------------------------------------------------------------------------    LABs:     Latest Ref Rng & Units 01/31/2022    7:37 AM 01/30/2022    5:03 AM 01/29/2022    9:06 PM  CBC  WBC 4.0 - 10.5 K/uL 1.5  2.0  3.1   Hemoglobin 12.0 - 15.0 g/dL 9.3  10.1  12.2   Hematocrit 36.0 - 46.0 % 29.9  32.3  38.0   Platelets 150 - 400 K/uL 193  248  327       Latest Ref Rng & Units 01/31/2022    7:37 AM 01/30/2022    5:03 AM 01/29/2022    9:06 PM  CMP  Glucose 70 - 99 mg/dL 150  139  152   BUN 8 - 23 mg/dL 34  62  65   Creatinine 0.44 - 1.00 mg/dL 1.13  1.99  2.09   Sodium 135 - 145 mmol/L 136  135  132   Potassium 3.5 - 5.1 mmol/L 3.4  3.5  3.7   Chloride 98 - 111 mmol/L 99  97  92   CO2 22 - 32 mmol/L '27  27  28   '$ Calcium 8.9 - 10.3 mg/dL 8.4  8.3  8.9   Total Protein 6.5 -  8.1 g/dL 5.4  5.9    Total Bilirubin 0.3 - 1.2 mg/dL 0.9  1.2    Alkaline Phos 38 - 126 U/L 45  56    AST 15 - 41 U/L 12  10    ALT 0 - 44 U/L 11  9         Micro Results Recent Results (from the past 240 hour(s))  Resp panel by RT-PCR (RSV, Flu A&B, Covid) Anterior Nasal Swab     Status: None   Collection Time: 01/29/22  9:13 PM   Specimen: Anterior Nasal Swab  Result Value Ref Range Status   SARS Coronavirus 2 by RT PCR NEGATIVE NEGATIVE Final    Comment: (NOTE) SARS-CoV-2 target nucleic  acids are NOT DETECTED.  The SARS-CoV-2 RNA is generally detectable in upper respiratory specimens during the acute phase of infection. The lowest concentration of SARS-CoV-2 viral copies this assay can detect is 138 copies/mL. A negative result does not preclude SARS-Cov-2 infection and should not be used as the sole basis for treatment or other patient management decisions. A negative result may occur with  improper specimen collection/handling, submission of specimen other than nasopharyngeal swab, presence of viral mutation(s) within the areas targeted by this assay, and inadequate number of viral copies(<138 copies/mL). A negative result must be combined with clinical observations, patient history, and epidemiological information. The expected result is Negative.  Fact Sheet for Patients:  EntrepreneurPulse.com.au  Fact Sheet for Healthcare Providers:  IncredibleEmployment.be  This test is no t yet approved or cleared by the [place] FDA and  has been authorized for detection and/or diagnosis of SARS-CoV-2 by FDA under an Emergency Use Authorization (EUA). This EUA will remain  in effect (meaning this test can be used) for the duration of the COVID-19 declaration under Section 564(b)(1) of the Act, 21 U.S.C.section 360bbb-3(b)(1), unless the authorization is terminated  or revoked sooner.       Influenza A by PCR NEGATIVE NEGATIVE Final   Influenza B by PCR NEGATIVE NEGATIVE Final    Comment: (NOTE) The Xpert Xpress SARS-CoV-2/FLU/RSV plus assay is intended as an aid in the diagnosis of influenza from Nasopharyngeal swab specimens and should not be used as a sole basis for treatment. Nasal washings and aspirates are unacceptable for Xpert Xpress SARS-CoV-2/FLU/RSV testing.  Fact Sheet for Patients: EntrepreneurPulse.com.au  Fact Sheet for Healthcare Providers: IncredibleEmployment.be  This  test is not yet approved or cleared by the [place] FDA and has been authorized for detection and/or diagnosis of SARS-CoV-2 by FDA under an Emergency Use Authorization (EUA). This EUA will remain in effect (meaning this test can be used) for the duration of the COVID-19 declaration under Section 564(b)(1) of the Act, 21 U.S.C. section 360bbb-3(b)(1), unless the authorization is terminated or revoked.     Resp Syncytial Virus by PCR NEGATIVE NEGATIVE Final    Comment: (NOTE) Fact Sheet for Patients: EntrepreneurPulse.com.au  Fact Sheet for Healthcare Providers: IncredibleEmployment.be  This test is not yet approved or cleared by the [place] FDA and has been authorized for detection and/or diagnosis of SARS-CoV-2 by FDA under an Emergency Use Authorization (EUA). This EUA will remain in effect (meaning this test can be used) for the duration of the COVID-19 declaration under Section 564(b)(1) of the Act, 21 U.S.C. section 360bbb-3(b)(1), unless the authorization is terminated or revoked.  Performed at [facility], [address] [place], [place] [address]     Radiology Reports No results found.  SIGNED: Deatra James,  MD, FHM. [facility],  Pager (please use [email or web address] to page/text) Please use Epic Secure Chat for non-urgent communication (7AM-7PM)  If 7PM-7AM, please contact night-coverage [email or web address], 01/31/2022, 12:04 PM

## 2022-01-31 NOTE — Assessment & Plan Note (Signed)
-   Monitoring repleting orally -Checking magnesium level

## 2022-01-31 NOTE — Assessment & Plan Note (Addendum)
-   Improved - Exacerbated by underlying COPD, likely upper respiratory infection-viral -Treating underlying causes, continue DuoNeb treatment, supplemental oxygen as needed -Mucolytic's

## 2022-02-01 DIAGNOSIS — N179 Acute kidney failure, unspecified: Secondary | ICD-10-CM | POA: Diagnosis not present

## 2022-02-01 LAB — COMPREHENSIVE METABOLIC PANEL
ALT: 12 U/L (ref 0–44)
AST: 8 U/L — ABNORMAL LOW (ref 15–41)
Albumin: 2.4 g/dL — ABNORMAL LOW (ref 3.5–5.0)
Alkaline Phosphatase: 41 U/L (ref 38–126)
Anion gap: 10 (ref 5–15)
BUN: 27 mg/dL — ABNORMAL HIGH (ref 8–23)
CO2: 26 mmol/L (ref 22–32)
Calcium: 8.3 mg/dL — ABNORMAL LOW (ref 8.9–10.3)
Chloride: 98 mmol/L (ref 98–111)
Creatinine, Ser: 1.17 mg/dL — ABNORMAL HIGH (ref 0.44–1.00)
GFR, Estimated: 50 mL/min — ABNORMAL LOW (ref >60)
Glucose, Bld: 162 mg/dL — ABNORMAL HIGH (ref 70–99)
Potassium: 3.4 mmol/L — ABNORMAL LOW (ref 3.5–5.1)
Sodium: 134 mmol/L — ABNORMAL LOW (ref 135–145)
Total Bilirubin: 0.6 mg/dL (ref 0.3–1.2)
Total Protein: 5.1 g/dL — ABNORMAL LOW (ref 6.5–8.1)

## 2022-02-01 LAB — CBC
HCT: 27.9 % — ABNORMAL LOW (ref 36.0–46.0)
Hemoglobin: 8.9 g/dL — ABNORMAL LOW (ref 12.0–15.0)
MCH: 26.5 pg (ref 26.0–34.0)
MCHC: 31.9 g/dL (ref 30.0–36.0)
MCV: 83 fL (ref 80.0–100.0)
Platelets: 161 10*3/uL (ref 150–400)
RBC: 3.36 MIL/uL — ABNORMAL LOW (ref 3.87–5.11)
RDW: 16.1 % — ABNORMAL HIGH (ref 11.5–15.5)
WBC: 1 10*3/uL — CL (ref 4.0–10.5)
nRBC: 0 % (ref 0.0–0.2)

## 2022-02-01 LAB — IRON AND TIBC
Iron: 21 ug/dL — ABNORMAL LOW (ref 28–170)
Saturation Ratios: 10 % — ABNORMAL LOW (ref 10.4–31.8)
TIBC: 211 ug/dL — ABNORMAL LOW (ref 250–450)
UIBC: 190 ug/dL

## 2022-02-01 LAB — FOLATE: Folate: 7.5 ng/mL (ref >5.9)

## 2022-02-01 LAB — VITAMIN B12: Vitamin B-12: 156 pg/mL — ABNORMAL LOW (ref 180–914)

## 2022-02-01 MED ORDER — FOLIC ACID 1 MG PO TABS
1.0000 mg | ORAL_TABLET | Freq: Every day | ORAL | Status: DC
  Administered 2022-02-01 – 2022-02-03 (×3): 1 mg via ORAL
  Filled 2022-02-01 (×4): qty 1

## 2022-02-01 MED ORDER — POTASSIUM CHLORIDE CRYS ER 20 MEQ PO TBCR
40.0000 meq | EXTENDED_RELEASE_TABLET | Freq: Once | ORAL | Status: AC
  Administered 2022-02-01: 40 meq via ORAL
  Filled 2022-02-01: qty 2

## 2022-02-01 MED ORDER — MORPHINE SULFATE (PF) 2 MG/ML IV SOLN: 2.0000 mg | INTRAVENOUS | Status: DC | PRN

## 2022-02-01 MED ORDER — PREDNISONE 20 MG PO TABS
40.0000 mg | ORAL_TABLET | Freq: Every day | ORAL | Status: DC
  Administered 2022-02-02 – 2022-02-03 (×2): 40 mg via ORAL
  Filled 2022-02-01: qty 2

## 2022-02-01 MED ORDER — VITAMIN B-12 1000 MCG PO TABS
1000.0000 ug | ORAL_TABLET | Freq: Every day | ORAL | Status: DC
  Administered 2022-02-01 – 2022-02-03 (×3): 1000 ug via ORAL
  Filled 2022-02-01 (×3): qty 1

## 2022-02-01 NOTE — Plan of Care (Signed)
  Problem: Education: Goal: Knowledge of disease or condition will improve Outcome: Progressing   Problem: Education: Goal: Knowledge of General Education information will improve Description: Including pain rating scale, medication(s)/side effects and non-pharmacologic comfort measures Outcome: Progressing

## 2022-02-01 NOTE — Progress Notes (Signed)
Date and time results received: 02/01/22 0653   Test: WBC Critical Value: 1.0  Name of Provider Notified: Dr Roger Shelter  Orders Received? Or Actions Taken?: Physician informed. No further orders at this time. Bryson Corona Edd Fabian

## 2022-02-01 NOTE — Progress Notes (Signed)
PROGRESS NOTE    Patient: Rhonda Briggs                            PCP: Danna Hefty, DO                    DOB: March 30, 1950            DOA: 01/29/2022 VWP:[id number]             DOS: 02/01/2022, 11:58 AM   LOS: 2 days   Date of Service: The patient was seen and examined on 02/01/2022  Subjective:   The patient was seen and examined this morning, still complain shortness of breath with minimal exertion, cough, lip and oral lesions.... Otherwise hemodynamically stable, satting 94% on room air  POA w multiple complaints including shortness of breath, COPD, recent diagnosis of COVID, diarrhea, dysuria.   Brief Narrative:   Rhonda Briggs is a 72 y.o. female with medical history significant of anemia, COPD, hyperlipidemia, hypertension, tobacco use disorder, and more presents the ED with a chief complaint of shortness of breath.  Patient initially presented with cough, wheeze, dyspnea with exertion.  She improved with treatment in the ER, and does not remember to discuss that without being prompted.  She reports that her cough is productive of clear sputum.  She denies chest pain.  She reports that she was wheezing, but that seems to improve.  Her cough started about 3 days ago.  It was getting worse since it started.  Her dyspnea was worse on exertion.  She reports she went to one of the med center as needed a throat culture on the third and determined that she did not have strep.  Today her flu a, B, RSV, COVID are negative.  Will continue her treatment for COPD.   She is Concern is her GI symptoms.  She reports she has ulcers in her mouth that started on her lips and moved to her tongue.  This started about 5 days ago.  She has not tried anything for them at home.  So painful that she is unable to eat the crackers that she has at bedside.  She has never had ulcers like that before.  Patient reports that she has had some nausea, vomiting, diarrhea at home.  The nausea and vomiting is 3-4 times a  day and is nonbloody.  Associated with abdominal cramping that is intermittent.  She reports she has diarrhea 4-5 times per day.  It is also nonbloody but she does report melena.  FOBT card at bedside for patient's next bowel movement.  Patient reports that she has not eaten anything out of the ordinary for her.  No one around her is sick.  She denies a fever.  Patient has no other complaints at this time.   Review of systems she does report dysuria which started 3 days ago.  She denies hematuria.  UA ordered at admission   Patient smokes 7-10 cigarettes/day.  She does not drink.  She does not have her COVID-vaccine.  Patient is DNR.  ED: Temp 98.2, HR 89-105, RR 14-23, blood pressure 112/48-145/76, satting 96% Leukopenic with a white blood cell count of 3.1, hemoglobin 12.2 Hyponatremic with a sodium of 132, elevated creatinine 2.09 Negative COVID and flu Chest x-ray close no acute disease GI panel pending, C. difficile PCR pending EKG shows a heart rate of 104, sinus tach, QTc 449 Admission requested for further  management of dehydration workup this intractable diarrhea    Assessment & Plan:   Principal Problem:   AKI (acute kidney injury) ([place]) Active Problems:   Hypokalemia   Pancytopenia (HCC)   COPD with acute exacerbation (HCC)   Tobacco use disorder   Hyperlipidemia   Diarrhea   SOB (shortness of breath)     Assessment and Plan: * AKI (acute kidney injury) ([place]) - Much improved  - creatinine 1.14>> 2.09 >> 1.13  - Prerenal in the setting of dehydration secondary to GI losses - Hold nephrotoxic agents when possible - CIV fluids... Discontinued  Lab Results  Component Value Date   CREATININE 1.17 (H) 02/01/2022   CREATININE 1.13 (H) 01/31/2022   CREATININE 1.99 (H) 01/30/2022      Pancytopenia ([place]) - Likely exacerbated by upper respiratory-viral infection -Monitoring closely     Latest Ref Rng & Units 02/01/2022    5:01 AM 01/31/2022    7:37 AM 01/30/2022     5:03 AM  CBC  WBC 4.0 - 10.5 K/uL 1.0  1.5  2.0   Hemoglobin 12.0 - 15.0 g/dL 8.9  9.3  10.1   Hematocrit 36.0 - 46.0 % 27.9  29.9  32.3   Platelets 150 - 400 K/uL 161  193  248      Hypokalemia - Monitoring repleting orally -Checking magnesium level  SOB (shortness of breath) - Improved - Exacerbated by underlying COPD, likely upper respiratory infection-viral -Treating underlying causes, continue DuoNeb treatment, supplemental oxygen as needed -Mucolytic's   Diarrhea - Resolved -Reported 1 loose stool overnight - Patient skin is raw from diarrhea - Wound consult - GI pathogen and C. difficile pending - No diarrhea since being in the ER, - Groin skin rash because of diarrhea, raw-Desitin, zinc oxide cream ordered  Hyperlipidemia - Continue pravastatin  Tobacco use disorder - Counseled on importance of cessation - Patient reports she has not had a cigarette since symptoms started, some issues while on her road to recovery at this time  COPD with acute exacerbation (HCC) - Currently on room air satting 94% -Shortness of breath with exertion - Counseled on importance of smoking cessation - Continue albuterol and ipratropium - Continue Symbicort and Incruse -IV Solu-Medrol initiated >>. tapering down   -------------------------------------------------------------------------------------------------------------------------------------  DVT prophylaxis:  heparin injection 5,000 Units Start: 01/30/22 0600 SCDs Start: 01/30/22 0436   Code Status:   Code Status: DNR  Family Communication: Daughter at bedside-updated The above findings and plan of care has been discussed with patient (and family)  in detail,  they expressed understanding and agreement of above. -Advance care planning has been discussed.   Admission status:   Status is: Observation The patient remains OBS appropriate and will d/c before 2 midnights.     Procedures:   No admission procedures  for hospital encounter.   Antimicrobials:  Anti-infectives (From admission, onward)    Start     Dose/Rate Route Frequency Ordered Stop   01/31/22 0445  azithromycin (ZITHROMAX) tablet 500 mg       See Hyperspace for full Linked Orders Report.   500 mg Oral Daily 01/30/22 0435 02/04/22 0959   01/30/22 0435  azithromycin (ZITHROMAX) 500 mg in sodium chloride 0.9 % 250 mL IVPB       See Hyperspace for full Linked Orders Report.   500 mg 250 mL/hr over 60 Minutes Intravenous Every 24 hours 01/30/22 0435 01/30/22 0622        Medication:   acidophilus  2 capsule Oral  TID   azithromycin  500 mg Oral Daily   vitamin B-12  1,000 mcg Oral Daily   folic acid  1 mg Oral Daily   gabapentin  100 mg Oral TID   heparin  5,000 Units Subcutaneous Q8H   ipratropium-albuterol  3 mL Nebulization TID   liver oil-zinc oxide   Topical BID   methylPREDNISolone (SOLU-MEDROL) injection  40 mg Intravenous Q12H   mometasone-formoterol  2 puff Inhalation BID   multivitamin with minerals  1 tablet Oral Daily   pramipexole  0.125 mg Oral QHS   pravastatin  20 mg Oral Daily   umeclidinium bromide  1 puff Inhalation Daily   zinc sulfate  220 mg Oral Daily    acetaminophen **OR** acetaminophen, albuterol, lidocaine, morphine injection, ondansetron **OR** ondansetron (ZOFRAN) IV, oxyCODONE, zinc oxide   Objective:   Vitals:   01/31/22 1923 01/31/22 2017 02/01/22 0338 02/01/22 0817  BP:  (!) 104/53 138/64   Pulse:  95 84   Resp:  18 16   Temp:  98.1 F (36.7 C) 98.4 F (36.9 C)   TempSrc:      SpO2: 95% 93% 92% 94%  Weight:      Height:        Intake/Output Summary (Last 24 hours) at 02/01/2022 1158 Last data filed at 02/01/2022 0500 Gross per 24 hour  Intake 720 ml  Output 1000 ml  Net -280 ml   Filed Weights   01/29/22 2103 01/30/22 1300  Weight: 66.7 kg 70.7 kg     Examination:   General:  AAO x 3,  cooperative, no distress;   HEENT:  Excessively dry lips, irritation erosion  mucosal area  normocephalic, PERRL, otherwise with in Normal limits   Neuro:  CNII-XII intact. , normal motor and sensation, reflexes intact   Lungs:   Clear to auscultation BL, Respirations unlabored,  No wheezes / crackles  Cardio:    S1/S2, RRR, No murmure, No Rubs or Gallops   Abdomen:  Soft, non-tender, bowel sounds active all four quadrants, no guarding or peritoneal signs.  Muscular  skeletal:  Limited exam -global generalized weaknesses - in bed, able to move all 4 extremities,   2+ pulses,  symmetric, No pitting edema  Skin:  Dry, warm to touch, negative for any Rashes,  Wounds: Please see nursing documentation            ------------------------------------------------------------------------------------------------------------------------------------------    LABs:     Latest Ref Rng & Units 02/01/2022    5:01 AM 01/31/2022    7:37 AM 01/30/2022    5:03 AM  CBC  WBC 4.0 - 10.5 K/uL 1.0  1.5  2.0   Hemoglobin 12.0 - 15.0 g/dL 8.9  9.3  10.1   Hematocrit 36.0 - 46.0 % 27.9  29.9  32.3   Platelets 150 - 400 K/uL 161  193  248       Latest Ref Rng & Units 02/01/2022    5:01 AM 01/31/2022    7:37 AM 01/30/2022    5:03 AM  CMP  Glucose 70 - 99 mg/dL 162  150  139   BUN 8 - 23 mg/dL 27  34  62   Creatinine 0.44 - 1.00 mg/dL 1.17  1.13  1.99   Sodium 135 - 145 mmol/L 134  136  135   Potassium 3.5 - 5.1 mmol/L 3.4  3.4  3.5   Chloride 98 - 111 mmol/L 98  99  97   CO2 22 - 32  mmol/L '26  27  27   '$ Calcium 8.9 - 10.3 mg/dL 8.3  8.4  8.3   Total Protein 6.5 - 8.1 g/dL 5.1  5.4  5.9   Total Bilirubin 0.3 - 1.2 mg/dL 0.6  0.9  1.2   Alkaline Phos 38 - 126 U/L 41  45  56   AST 15 - 41 U/L '8  12  10   '$ ALT 0 - 44 U/L '12  11  9        '$ Micro Results Recent Results (from the past 240 hour(s))  Resp panel by RT-PCR (RSV, Flu A&B, Covid) Anterior Nasal Swab     Status: None   Collection Time: 01/29/22  9:13 PM   Specimen: Anterior Nasal Swab  Result Value Ref Range Status    SARS Coronavirus 2 by RT PCR NEGATIVE NEGATIVE Final    Comment: (NOTE) SARS-CoV-2 target nucleic acids are NOT DETECTED.  The SARS-CoV-2 RNA is generally detectable in upper respiratory specimens during the acute phase of infection. The lowest concentration of SARS-CoV-2 viral copies this assay can detect is 138 copies/mL. A negative result does not preclude SARS-Cov-2 infection and should not be used as the sole basis for treatment or other patient management decisions. A negative result may occur with  improper specimen collection/handling, submission of specimen other than nasopharyngeal swab, presence of viral mutation(s) within the areas targeted by this assay, and inadequate number of viral copies(<138 copies/mL). A negative result must be combined with clinical observations, patient history, and epidemiological information. The expected result is Negative.  Fact Sheet for Patients:  EntrepreneurPulse.com.au  Fact Sheet for Healthcare Providers:  IncredibleEmployment.be  This test is no t yet approved or cleared by the [place] FDA and  has been authorized for detection and/or diagnosis of SARS-CoV-2 by FDA under an Emergency Use Authorization (EUA). This EUA will remain  in effect (meaning this test can be used) for the duration of the COVID-19 declaration under Section 564(b)(1) of the Act, 21 U.S.C.section 360bbb-3(b)(1), unless the authorization is terminated  or revoked sooner.       Influenza A by PCR NEGATIVE NEGATIVE Final   Influenza B by PCR NEGATIVE NEGATIVE Final    Comment: (NOTE) The Xpert Xpress SARS-CoV-2/FLU/RSV plus assay is intended as an aid in the diagnosis of influenza from Nasopharyngeal swab specimens and should not be used as a sole basis for treatment. Nasal washings and aspirates are unacceptable for Xpert Xpress SARS-CoV-2/FLU/RSV testing.  Fact Sheet for  Patients: EntrepreneurPulse.com.au  Fact Sheet for Healthcare Providers: IncredibleEmployment.be  This test is not yet approved or cleared by the [place] FDA and has been authorized for detection and/or diagnosis of SARS-CoV-2 by FDA under an Emergency Use Authorization (EUA). This EUA will remain in effect (meaning this test can be used) for the duration of the COVID-19 declaration under Section 564(b)(1) of the Act, 21 U.S.C. section 360bbb-3(b)(1), unless the authorization is terminated or revoked.     Resp Syncytial Virus by PCR NEGATIVE NEGATIVE Final    Comment: (NOTE) Fact Sheet for Patients: EntrepreneurPulse.com.au  Fact Sheet for Healthcare Providers: IncredibleEmployment.be  This test is not yet approved or cleared by the [place] FDA and has been authorized for detection and/or diagnosis of SARS-CoV-2 by FDA under an Emergency Use Authorization (EUA). This EUA will remain in effect (meaning this test can be used) for the duration of the COVID-19 declaration under Section 564(b)(1) of the Act, 21 U.S.C. section 360bbb-3(b)(1), unless the authorization is  terminated or revoked.  Performed at [facility], [address] [place], [place] [address]     Radiology Reports No results found.  SIGNED: Deatra James, MD, FHM. [facility],  Pager (please use [email or web address] to page/text) Please use Epic Secure Chat for non-urgent communication (7AM-7PM)  If 7PM-7AM, please contact night-coverage [email or web address], 02/01/2022, 11:58 AM

## 2022-02-01 NOTE — Progress Notes (Signed)
Pt left wrist IV infiltrated. This RN obtained 22G in R AC. Flushes well and good blood return. She ambulated to restroom with one assist. She required PRN oxycodone x 2. She still c/o mouth pain. No acute events overnight. Bryson Corona Edd Fabian

## 2022-02-02 DIAGNOSIS — E538 Deficiency of other specified B group vitamins: Secondary | ICD-10-CM | POA: Clinically undetermined

## 2022-02-02 DIAGNOSIS — N179 Acute kidney failure, unspecified: Secondary | ICD-10-CM | POA: Diagnosis not present

## 2022-02-02 DIAGNOSIS — K1379 Other lesions of oral mucosa: Secondary | ICD-10-CM | POA: Diagnosis present

## 2022-02-02 DIAGNOSIS — N390 Urinary tract infection, site not specified: Secondary | ICD-10-CM | POA: Clinically undetermined

## 2022-02-02 LAB — COMPREHENSIVE METABOLIC PANEL
ALT: 18 U/L (ref 0–44)
AST: 17 U/L (ref 15–41)
Albumin: 2.8 g/dL — ABNORMAL LOW (ref 3.5–5.0)
Alkaline Phosphatase: 53 U/L (ref 38–126)
Anion gap: 10 (ref 5–15)
BUN: 25 mg/dL — ABNORMAL HIGH (ref 8–23)
CO2: 27 mmol/L (ref 22–32)
Calcium: 8.7 mg/dL — ABNORMAL LOW (ref 8.9–10.3)
Chloride: 98 mmol/L (ref 98–111)
Creatinine, Ser: 1.25 mg/dL — ABNORMAL HIGH (ref 0.44–1.00)
GFR, Estimated: 46 mL/min — ABNORMAL LOW (ref >60)
Glucose, Bld: 152 mg/dL — ABNORMAL HIGH (ref 70–99)
Potassium: 3.9 mmol/L (ref 3.5–5.1)
Sodium: 135 mmol/L (ref 135–145)
Total Bilirubin: 0.6 mg/dL (ref 0.3–1.2)
Total Protein: 6.1 g/dL — ABNORMAL LOW (ref 6.5–8.1)

## 2022-02-02 LAB — CBC
HCT: 32.9 % — ABNORMAL LOW (ref 36.0–46.0)
Hemoglobin: 10.3 g/dL — ABNORMAL LOW (ref 12.0–15.0)
MCH: 25.9 pg — ABNORMAL LOW (ref 26.0–34.0)
MCHC: 31.3 g/dL (ref 30.0–36.0)
MCV: 82.9 fL (ref 80.0–100.0)
Platelets: 169 10*3/uL (ref 150–400)
RBC: 3.97 MIL/uL (ref 3.87–5.11)
RDW: 16.5 % — ABNORMAL HIGH (ref 11.5–15.5)
WBC: 1.8 10*3/uL — ABNORMAL LOW (ref 4.0–10.5)
nRBC: 1.1 % — ABNORMAL HIGH (ref 0.0–0.2)

## 2022-02-02 MED ORDER — FERROUS SULFATE 325 (65 FE) MG PO TABS
325.0000 mg | ORAL_TABLET | Freq: Two times a day (BID) | ORAL | Status: DC
  Administered 2022-02-03 (×2): 325 mg via ORAL
  Filled 2022-02-02 (×2): qty 1

## 2022-02-02 MED ORDER — PHENOL 1.4 % MT LIQD
1.0000 | OROMUCOSAL | Status: DC | PRN
  Filled 2022-02-02: qty 177

## 2022-02-02 MED ORDER — SODIUM CHLORIDE 0.9 % IV SOLN
250.0000 mg | Freq: Once | INTRAVENOUS | Status: AC
  Administered 2022-02-02: 250 mg via INTRAVENOUS
  Filled 2022-02-02: qty 20

## 2022-02-02 MED ORDER — CIPROFLOXACIN HCL 250 MG PO TABS
250.0000 mg | ORAL_TABLET | Freq: Two times a day (BID) | ORAL | Status: DC
  Administered 2022-02-02 – 2022-02-03 (×2): 250 mg via ORAL
  Filled 2022-02-02 (×2): qty 1

## 2022-02-02 MED ORDER — CYANOCOBALAMIN 1000 MCG/ML IJ SOLN
1000.0000 ug | Freq: Once | INTRAMUSCULAR | Status: AC
  Administered 2022-02-02: 1000 ug via INTRAMUSCULAR
  Filled 2022-02-02: qty 1

## 2022-02-02 MED ORDER — MAGIC MOUTHWASH W/LIDOCAINE
5.0000 mL | Freq: Four times a day (QID) | ORAL | Status: DC
  Administered 2022-02-02 – 2022-02-03 (×5): 5 mL via ORAL
  Filled 2022-02-02 (×7): qty 5

## 2022-02-02 NOTE — Assessment & Plan Note (Signed)
-   Initiate B12 supplements, followed by p.o.

## 2022-02-02 NOTE — Assessment & Plan Note (Signed)
-  Urine culture positive for E. coli greater than 100 K colonies -Switching azithromycin to p.o. ciprofloxacin

## 2022-02-02 NOTE — Progress Notes (Signed)
PROGRESS NOTE    Patient: Rhonda Briggs                            PCP: Danna Hefty, DO                    DOB: 1950-10-16            DOA: 01/29/2022 KZS:[id number]             DOS: 02/02/2022, 1:44 PM   LOS: 3 days   Date of Service: The patient was seen and examined on 02/02/2022  Subjective:   The patient was seen and examined this morning, stable still complaining of a lip and oral lesions difficulty swallowing due to pain Poor oral intake due to pain  Improved shortness of breath and cough, improved diarrhea, bowel movements  POA w multiple complaints including shortness of breath, COPD, recent diagnosis of COVID, diarrhea, dysuria.   Brief Narrative:   Rhonda Briggs is a 72 y.o. female with medical history significant of anemia, COPD, hyperlipidemia, hypertension, tobacco use disorder, and more presents the ED with a chief complaint of shortness of breath.  Patient initially presented with cough, wheeze, dyspnea with exertion.  She improved with treatment in the ER, and does not remember to discuss that without being prompted.  She reports that her cough is productive of clear sputum.  She denies chest pain.  She reports that she was wheezing, but that seems to improve.  Her cough started about 3 days ago.  It was getting worse since it started.  Her dyspnea was worse on exertion.  She reports she went to one of the med center as needed a throat culture on the third and determined that she did not have strep.  Today her flu a, B, RSV, COVID are negative.  Will continue her treatment for COPD.   She is Concern is her GI symptoms.  She reports she has ulcers in her mouth that started on her lips and moved to her tongue.  This started about 5 days ago.  She has not tried anything for them at home.  So painful that she is unable to eat the crackers that she has at bedside.  She has never had ulcers like that before.  Patient reports that she has had some nausea, vomiting, diarrhea at home.   The nausea and vomiting is 3-4 times a day and is nonbloody.  Associated with abdominal cramping that is intermittent.  She reports she has diarrhea 4-5 times per day.  It is also nonbloody but she does report melena.  FOBT card at bedside for patient's next bowel movement.  Patient reports that she has not eaten anything out of the ordinary for her.  No one around her is sick.  She denies a fever.  Patient has no other complaints at this time.   Review of systems she does report dysuria which started 3 days ago.  She denies hematuria.  UA ordered at admission   Patient smokes 7-10 cigarettes/day.  She does not drink.  She does not have her COVID-vaccine.  Patient is DNR.  ED: Temp 98.2, HR 89-105, RR 14-23, blood pressure 112/48-145/76, satting 96% Leukopenic with a white blood cell count of 3.1, hemoglobin 12.2 Hyponatremic with a sodium of 132, elevated creatinine 2.09 Negative COVID and flu Chest x-ray close no acute disease GI panel pending, C. difficile PCR pending EKG shows a heart rate  of 104, sinus tach, QTc 449 Admission requested for further management of dehydration workup this intractable diarrhea    Assessment & Plan:   Principal Problem:   AKI (acute kidney injury) ([place]) Active Problems:   Hypokalemia   Pancytopenia ([place])   COPD with acute exacerbation (HCC)   Tobacco use disorder   Hyperlipidemia   Diarrhea   SOB (shortness of breath)   B12 deficiency   Mucosal irritation of oral cavity   UTI (urinary tract infection)     Assessment and Plan: * AKI (acute kidney injury) ([place]) - Much improved  - creatinine 1.14>> 2.09 >> 1.13 , 1.25 - Prerenal in the setting of dehydration secondary to GI losses - Hold nephrotoxic agents when possible - IV  fluids... Discontinued  Lab Results  Component Value Date   CREATININE 1.25 (H) 02/02/2022   CREATININE 1.17 (H) 02/01/2022   CREATININE 1.13 (H) 01/31/2022      Pancytopenia (HCC) - Likely exacerbated by  upper respiratory-viral infection -Monitoring closely     Latest Ref Rng & Units 02/02/2022    6:33 AM 02/01/2022    5:01 AM 01/31/2022    7:37 AM  CBC  WBC 4.0 - 10.5 K/uL 1.8  1.0  1.5   Hemoglobin 12.0 - 15.0 g/dL 10.3  8.9  9.3   Hematocrit 36.0 - 46.0 % 32.9  27.9  29.9   Platelets 150 - 400 K/uL 169  161  193    Iron/TIBC/Ferritin/ %Sat    Component Value Date/Time   IRON 21 (L) 02/01/2022 0501   TIBC 211 (L) 02/01/2022 0501   IRONPCTSAT 10 (L) 02/01/2022 0501   -Initiating IV iron x 1 then supplement iron p.o. -Also low B12, initiating subcu B12 x 110 initiating oral   Hypokalemia - Monitoring repleting orally -Checking magnesium level  UTI (urinary tract infection) -Urine culture positive for E. coli greater than 100 K colonies -Switching azithromycin to p.o. ciprofloxacin  Mucosal irritation of oral cavity -Continue with nystatin swish and swallow -Magic mouthwash initiated  B12 deficiency - Initiate B12 supplements, followed by p.o.  SOB (shortness of breath) - Improved - Exacerbated by underlying COPD, likely upper respiratory infection-viral -Treating underlying causes, continue DuoNeb treatment, supplemental oxygen as needed -Mucolytic's   Diarrhea - Resolved -Reported 1 loose stool overnight - Patient skin is raw from diarrhea - Wound consult - GI pathogen and C. difficile pending - No diarrhea since being in the ER, - Groin skin rash because of diarrhea, raw-Desitin, zinc oxide cream ordered  Hyperlipidemia - Continue pravastatin  Tobacco use disorder - Counseled on importance of cessation - Patient reports she has not had a cigarette since symptoms started, some issues while on her road to recovery at this time  COPD with acute exacerbation ([place]) -- Improved remain on room air percent -Shortness of breath with exertion - Counseled on importance of smoking cessation - Continue albuterol and ipratropium - Continue Symbicort and Incruse -IV  Solu-Medrol initiated >>. tapering down   --------------------------------------------------------------------------------------------------------------------------  DVT prophylaxis:  heparin injection 5,000 Units Start: 01/30/22 0600 SCDs Start: 01/30/22 0436   Code Status:   Code Status: DNR  Family Communication: Daughter at bedside-updated The above findings and plan of care has been discussed with patient (and family)  in detail,  they expressed understanding and agreement of above. -Advance care planning has been discussed.   Admission status:   Status is: Observation The patient remains OBS appropriate and will d/c before 2 midnights.  Procedures:   No admission procedures for hospital encounter.   Antimicrobials:  Anti-infectives (From admission, onward)    Start     Dose/Rate Route Frequency Ordered Stop   01/31/22 0445  azithromycin (ZITHROMAX) tablet 500 mg       See Hyperspace for full Linked Orders Report.   500 mg Oral Daily 01/30/22 0435 02/04/22 0959   01/30/22 0435  azithromycin (ZITHROMAX) 500 mg in sodium chloride 0.9 % 250 mL IVPB       See Hyperspace for full Linked Orders Report.   500 mg 250 mL/hr over 60 Minutes Intravenous Every 24 hours 01/30/22 0435 01/30/22 0622        Medication:   acidophilus  2 capsule Oral TID   azithromycin  500 mg Oral Daily   vitamin B-12  1,000 mcg Oral Daily   [START ON 02/03/2022] ferrous sulfate  325 mg Oral BID WC   folic acid  1 mg Oral Daily   gabapentin  100 mg Oral TID   heparin  5,000 Units Subcutaneous Q8H   ipratropium-albuterol  3 mL Nebulization TID   liver oil-zinc oxide   Topical BID   magic mouthwash w/lidocaine  5 mL Oral QID   mometasone-formoterol  2 puff Inhalation BID   multivitamin with minerals  1 tablet Oral Daily   pramipexole  0.125 mg Oral QHS   pravastatin  20 mg Oral Daily   predniSONE  40 mg Oral Q breakfast   umeclidinium bromide  1 puff Inhalation Daily   zinc sulfate   220 mg Oral Daily    acetaminophen **OR** acetaminophen, albuterol, lidocaine, morphine injection, ondansetron **OR** ondansetron (ZOFRAN) IV, oxyCODONE, phenol, zinc oxide   Objective:   Vitals:   02/01/22 2205 02/02/22 0348 02/02/22 0714 02/02/22 1337  BP: (!) 147/59 (!) 152/67    Pulse: (!) 106 80    Resp: 20 16    Temp: 98.5 F (36.9 C) (!) 96.8 F (36 C)    TempSrc: Oral     SpO2: 95% 96% 97% 97%  Weight:      Height:        Intake/Output Summary (Last 24 hours) at 02/02/2022 1344 Last data filed at 02/02/2022 5361 Gross per 24 hour  Intake 240 ml  Output 300 ml  Net -60 ml   Filed Weights   01/29/22 2103 01/30/22 1300  Weight: 66.7 kg 70.7 kg     Examination:       General:  AAO x 3,  cooperative, no distress;   HEENT:  Extensive lip and oral lesions -erythema,  normocephalic, PERRL, otherwise with in Normal limits   Neuro:  CNII-XII intact. , normal motor and sensation, reflexes intact   Lungs:   Clear to auscultation BL, Respirations unlabored,  Improved wheezes /  NO crackles  Cardio:    S1/S2, RRR, No murmure, No Rubs or Gallops   Abdomen:  Soft, non-tender, bowel sounds active all four quadrants, no guarding or peritoneal signs.  Muscular  skeletal:  Limited exam -global generalized weaknesses - in bed, able to move all 4 extremities,   2+ pulses,  symmetric, No pitting edema  Skin:  Dry, warm to touch, negative for any Rashes,  Wounds: Please see nursing documentation                 ------------------------------------------------------------------------------------------------------------------------------------------    LABs:     Latest Ref Rng & Units 02/02/2022    6:33 AM 02/01/2022    5:01 AM 01/31/2022  7:37 AM  CBC  WBC 4.0 - 10.5 K/uL 1.8  1.0  1.5   Hemoglobin 12.0 - 15.0 g/dL 10.3  8.9  9.3   Hematocrit 36.0 - 46.0 % 32.9  27.9  29.9   Platelets 150 - 400 K/uL 169  161  193       Latest Ref Rng & Units 02/02/2022    6:33  AM 02/01/2022    5:01 AM 01/31/2022    7:37 AM  CMP  Glucose 70 - 99 mg/dL 152  162  150   BUN 8 - 23 mg/dL 25  27  34   Creatinine 0.44 - 1.00 mg/dL 1.25  1.17  1.13   Sodium 135 - 145 mmol/L 135  134  136   Potassium 3.5 - 5.1 mmol/L 3.9  3.4  3.4   Chloride 98 - 111 mmol/L 98  98  99   CO2 22 - 32 mmol/L '27  26  27   '$ Calcium 8.9 - 10.3 mg/dL 8.7  8.3  8.4   Total Protein 6.5 - 8.1 g/dL 6.1  5.1  5.4   Total Bilirubin 0.3 - 1.2 mg/dL 0.6  0.6  0.9   Alkaline Phos 38 - 126 U/L 53  41  45   AST 15 - 41 U/L '17  8  12   '$ ALT 0 - 44 U/L '18  12  11        '$ Micro Results Recent Results (from the past 240 hour(s))  Resp panel by RT-PCR (RSV, Flu A&B, Covid) Anterior Nasal Swab     Status: None   Collection Time: 01/29/22  9:13 PM   Specimen: Anterior Nasal Swab  Result Value Ref Range Status   SARS Coronavirus 2 by RT PCR NEGATIVE NEGATIVE Final    Comment: (NOTE) SARS-CoV-2 target nucleic acids are NOT DETECTED.  The SARS-CoV-2 RNA is generally detectable in upper respiratory specimens during the acute phase of infection. The lowest concentration of SARS-CoV-2 viral copies this assay can detect is 138 copies/mL. A negative result does not preclude SARS-Cov-2 infection and should not be used as the sole basis for treatment or other patient management decisions. A negative result may occur with  improper specimen collection/handling, submission of specimen other than nasopharyngeal swab, presence of viral mutation(s) within the areas targeted by this assay, and inadequate number of viral copies(<138 copies/mL). A negative result must be combined with clinical observations, patient history, and epidemiological information. The expected result is Negative.  Fact Sheet for Patients:  EntrepreneurPulse.com.au  Fact Sheet for Healthcare Providers:  IncredibleEmployment.be  This test is no t yet approved or cleared by the [place] FDA and  has  been authorized for detection and/or diagnosis of SARS-CoV-2 by FDA under an Emergency Use Authorization (EUA). This EUA will remain  in effect (meaning this test can be used) for the duration of the COVID-19 declaration under Section 564(b)(1) of the Act, 21 U.S.C.section 360bbb-3(b)(1), unless the authorization is terminated  or revoked sooner.       Influenza A by PCR NEGATIVE NEGATIVE Final   Influenza B by PCR NEGATIVE NEGATIVE Final    Comment: (NOTE) The Xpert Xpress SARS-CoV-2/FLU/RSV plus assay is intended as an aid in the diagnosis of influenza from Nasopharyngeal swab specimens and should not be used as a sole basis for treatment. Nasal washings and aspirates are unacceptable for Xpert Xpress SARS-CoV-2/FLU/RSV testing.  Fact Sheet for Patients: EntrepreneurPulse.com.au  Fact Sheet for Healthcare Providers: IncredibleEmployment.be  This test  is not yet approved or cleared by the [place] and has been authorized for detection and/or diagnosis of SARS-CoV-2 by FDA under an Emergency Use Authorization (EUA). This EUA will remain in effect (meaning this test can be used) for the duration of the COVID-19 declaration under Section 564(b)(1) of the Act, 21 U.S.C. section 360bbb-3(b)(1), unless the authorization is terminated or revoked.     Resp Syncytial Virus by PCR NEGATIVE NEGATIVE Final    Comment: (NOTE) Fact Sheet for Patients: EntrepreneurPulse.com.au  Fact Sheet for Healthcare Providers: IncredibleEmployment.be  This test is not yet approved or cleared by the [place] FDA and has been authorized for detection and/or diagnosis of SARS-CoV-2 by FDA under an Emergency Use Authorization (EUA). This EUA will remain in effect (meaning this test can be used) for the duration of the COVID-19 declaration under Section 564(b)(1) of the Act, 21 U.S.C. section 360bbb-3(b)(1), unless the  authorization is terminated or revoked.  Performed at [facility], [address], [place], [place] [address]   Urine Culture     Status: Abnormal (Preliminary result)   Collection Time: 01/31/22  8:00 AM   Specimen: Urine, Clean Catch  Result Value Ref Range Status   Specimen Description   Final    URINE, CLEAN CATCH Performed at [facility], [address], [place],  [address]    Special Requests   Final    NONE Performed at [facility], [address] [place],  [address]    Culture (A)  Final    >=100,000 COLONIES/mL ESCHERICHIA COLI 50,000 COLONIES/mL PROTEUS MIRABILIS    Report Status PENDING  Incomplete    Radiology Reports No results found.  SIGNED: Deatra James, MD, FHM. [facility],  Pager (please use [email or web address] to page/text) Please use Epic Secure Chat for non-urgent communication (7AM-7PM)  If 7PM-7AM, please contact night-coverage [email or web address], 02/02/2022, 1:44 PM

## 2022-02-02 NOTE — TOC Initial Note (Signed)
Transition of Care [facility]) - Initial/Assessment Note    Patient Details  Name: Rhonda Briggs MRN: [id number] Date of Birth: [birth year]  Transition of Care [facility] CM/SW Contact:    Salome Arnt, [place] Phone Number: 02/02/2022, 8:40 AM  Clinical Narrative:  Pt admitted for acute kidney injury. Assessment completed due to high risk readmission score. Pt reports she lives alone and is independent with ADLs. She has transportation to appointments. Pt plans to return home when medically stable. PT evaluation pending. No needs reported at this time. TOC will continue to follow.                  Expected Discharge Plan: Home/Self Care Barriers to Discharge: Continued Medical Work up   Patient Goals and CMS Choice Patient states their goals for this hospitalization and ongoing recovery are:: return home   Choice offered to / list presented to : Patient [place] ownership interest in [facility]provided to::  (n/a)    Expected Discharge Plan and Services In-house Referral: Clinical Social Work     Living arrangements for the past 2 months: [place]                                      Prior Living Arrangements/Services Living arrangements for the past 2 months: Single Family Home Lives with:: Self Patient language and need for interpreter reviewed:: Yes Do you feel safe going back to the place where you live?: Yes      Need for Family Participation in Patient Care: No (Comment)   Current home services: DME (3N1, walker) Criminal Activity/Legal Involvement Pertinent to Current Situation/Hospitalization: No - Comment as needed  Activities of Daily Living Home Assistive Devices/Equipment: None ADL Screening (condition at time of admission) Patient's cognitive ability adequate to safely complete daily activities?: Yes Is the patient deaf or have difficulty hearing?: No Does the patient have difficulty seeing, even when wearing glasses/contacts?:  No Does the patient have difficulty concentrating, remembering, or making decisions?: No Patient able to express need for assistance with ADLs?: No Does the patient have difficulty dressing or bathing?: No Independently performs ADLs?: Yes (appropriate for developmental age) Does the patient have difficulty walking or climbing stairs?: No Weakness of Legs: None Weakness of Arms/Hands: None  Permission Sought/Granted                  Emotional Assessment     Affect (typically observed): Appropriate Orientation: : Oriented to Self, Oriented to  Time, Oriented to Place, Oriented to Situation Alcohol / Substance Use: Not Applicable Psych Involvement: No (comment)  Admission diagnosis:  COPD exacerbation ([place]) [J44.1] AKI (acute kidney injury) ([place]) [N17.9] SOB (shortness of breath) [R06.02] Patient Active Problem List   Diagnosis Date Noted   Hypokalemia 01/31/2022   Pancytopenia ([place]) 01/31/2022   AKI (acute kidney injury) ([place]) 01/30/2022   Diarrhea 01/30/2022   SOB (shortness of breath) 01/30/2022   Acute GI bleeding 03/19/2021   Anemia    Lower GI bleed 03/17/2021   Essential hypertension 03/17/2021   Hyperlipidemia 03/17/2021   Restless leg syndrome 03/17/2021   History of osteoarthritis 03/17/2021   Chronic kidney disease, stage III (moderate) ([place]) 03/17/2021   Obesity, Class I, BMI 30-34.9 03/17/2021   Prediabetes 11/18/2020   Nodular goiter 11/11/2020   Tobacco use disorder 11/11/2020   Thyroid nodule 02/26/2020   COPD with acute exacerbation ([place]) 02/15/2020  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   Cigarette smoker 12/25/2019   S/P total knee replacement, left 12/31/16 01/12/2017   Primary osteoarthritis 08/19/2016   S/P TKR (total knee replacement), right 08/19/2016    Primary osteoarthritis of right knee    PCP:  Danna Hefty, DO Pharmacy:   [place], [place] - [address] [place]  [address] Phone: [phone] Fax: [phone]     Social Determinants of Health (SDOH) Social History: SDOH Screenings   Tobacco Use: High Risk (01/29/2022)   SDOH Interventions:     Readmission Risk Interventions    02/02/2022    8:39 AM  Readmission Risk Prevention Plan  Transportation Screening Complete  HRI or [place] Complete  Social Work Consult for [place] Planning/Counseling Complete  Palliative Care Screening Not Applicable  Medication Review Press photographer) Complete

## 2022-02-02 NOTE — Evaluation (Signed)
Physical Therapy Evaluation Patient Details Name: Rhonda Briggs MRN: [id number] DOB: Mar 23, 1950 Today's Date: 02/02/2022  History of Present Illness  Rhonda Briggs is a 72 y.o. female with medical history significant of anemia, COPD, hyperlipidemia, hypertension, tobacco use disorder, and more presents the ED with a chief complaint of shortness of breath.  Patient initially presented with cough, wheeze, dyspnea with exertion.  She improved with treatment in the ER, and does not remember to discuss that without being prompted.  She reports that her cough is productive of clear sputum.  She denies chest pain.  She reports that she was wheezing, but that seems to improve.  Her cough started about 3 days ago.  It was getting worse since it started.  Her dyspnea was worse on exertion.  She reports she went to one of the med center as needed a throat culture on the third and determined that she did not have strep.  Today her flu a, B, RSV, COVID are negative.  Will continue her treatment for COPD.  When I see her her main concern is her GI symptoms.  She reports she has ulcers in her mouth that started on her lips and moved to her tongue.  This started about 5 days ago.  She has not tried anything for them at home.  So painful that she is unable to eat the crackers that she has at bedside.  She has never had ulcers like that before.  Patient reports that she has had some nausea, vomiting, diarrhea at home.  The nausea and vomiting is 3-4 times a day and is nonbloody.  Associated with abdominal cramping that is intermittent.  She reports she has diarrhea 4-5 times per day.  It is also nonbloody but she does report melena.  FOBT card at bedside for patient's next bowel movement.  Patient reports that she has not eaten anything out of the ordinary for her.  No one around her is sick.  She denies a fever.  Patient has no other complaints at this time.   Clinical Impression  Patient functioning near baseline for  functional mobility and gait with good return for bed mobility, transfers and ambulating in room/hallway without loss of balance.  Patient encouraged to ambulate in room ad lib and with nursing staff supervising in hallway for length of stay.  Plan:  Patient discharged from physical therapy to care of nursing for ambulation daily as tolerated for length of stay.         Recommendations for follow up therapy are one component of a multi-disciplinary discharge planning process, led by the attending physician.  Recommendations may be updated based on patient status, additional functional criteria and insurance authorization.  Follow Up Recommendations Home health PT      Assistance Recommended at Discharge Set up Supervision/Assistance  Patient can return home with the following  Help with stairs or ramp for entrance;Assistance with cooking/housework    Equipment Recommendations None recommended by PT  Recommendations for Other Services       Functional Status Assessment Patient has had a recent decline in their functional status and demonstrates the ability to make significant improvements in function in a reasonable and predictable amount of time.     Precautions / Restrictions Precautions Precautions: None Restrictions Weight Bearing Restrictions: No      Mobility  Bed Mobility Overal bed mobility: Modified Independent                  Transfers Overall transfer level:  Modified independent                      Ambulation/Gait Ambulation/Gait assistance: Modified independent (Device/Increase time) Gait Distance (Feet): 100 Feet Assistive device: None Gait Pattern/deviations: Decreased step length - right, Decreased step length - left, Decreased stride length Gait velocity: decreased     General Gait Details: slightly labored cadence without loss of balance with good return for ambulating in room/hallway without loss of balance  Stairs             Wheelchair Mobility    Modified Rankin (Stroke Patients Only)       Balance Overall balance assessment: Mild deficits observed, not formally tested                                           Pertinent Vitals/Pain Pain Assessment Pain Assessment: 0-10 Pain Score: 8  Pain Location: mostly over buttocks Pain Descriptors / Indicators: Sore Pain Intervention(s): Limited activity within patient's tolerance, Monitored during session, Repositioned    Home Living Family/patient expects to be discharged to:: Private residence Living Arrangements: Alone Available Help at Discharge: Family;Available PRN/intermittently Type of Home: House Home Access: Ramped entrance       Home Layout: One level Home Equipment: Conservation officer, nature (2 wheels);Cane - single point;BSC/3in1      Prior Function Prior Level of Function : Independent/Modified Independent;Driving             Mobility Comments: Hydrographic surveyor without AD ADLs Comments: Independent     Hand Dominance   Dominant Hand: Right    Extremity/Trunk Assessment   Upper Extremity Assessment Upper Extremity Assessment: Overall WFL for tasks assessed    Lower Extremity Assessment Lower Extremity Assessment: Overall WFL for tasks assessed    Cervical / Trunk Assessment Cervical / Trunk Assessment: Normal  Communication   Communication: No difficulties  Cognition Arousal/Alertness: Awake/alert Behavior During Therapy: WFL for tasks assessed/performed Overall Cognitive Status: Within Functional Limits for tasks assessed                                          General Comments      Exercises     Assessment/Plan    PT Assessment All further PT needs can be met in the next venue of care  PT Problem List Decreased strength;Decreased activity tolerance;Decreased balance;Decreased mobility       PT Treatment Interventions      PT Goals (Current goals can be found in the  Care Plan section)  Acute Rehab PT Goals Patient Stated Goal: return home with family to assist PT Goal Formulation: With patient Time For Goal Achievement: 02/02/22 Potential to Achieve Goals: Good    Frequency       Co-evaluation               AM-PAC PT "6 Clicks" Mobility  Outcome Measure Help needed turning from your back to your side while in a flat bed without using bedrails?: None Help needed moving from lying on your back to sitting on the side of a flat bed without using bedrails?: None Help needed moving to and from a bed to a chair (including a wheelchair)?: None Help needed standing up from a chair using your arms (e.g., wheelchair or bedside  chair)?: None Help needed to walk in hospital room?: A Little Help needed climbing 3-5 steps with a railing? : A Little 6 Click Score: 22    End of Session   Activity Tolerance: Patient tolerated treatment well Patient left: in bed;with call bell/phone within reach Nurse Communication: Mobility status PT Visit Diagnosis: Unsteadiness on feet (R26.81);Other abnormalities of gait and mobility (R26.89);Muscle weakness (generalized) (M62.81)    Time: 4536-4680 PT Time Calculation (min) (ACUTE ONLY): 20 min   Charges:   PT Evaluation $PT Eval Moderate Complexity: 1 Mod PT Treatments $Therapeutic Activity: 8-22 mins        2:40 PM, 02/02/22 Lonell Grandchild, MPT Physical Therapist with [facility] [place] [address] [phone] office [phone] mobile phone

## 2022-02-02 NOTE — TOC Progression Note (Signed)
Transition of Care [facility]) - Progression Note    Patient Details  Name: Lousie Calico MRN: [id number] Date of Birth: 25-Aug-1950  [facility]) CM/SW Contact  Salome Arnt, [place] Phone Number: 02/02/2022, 10:30 AM  Clinical Narrative: PT recommending home health. Discussed with pt who is agreeable with no preference on agency. Referred and accepted by [name] with [facility]. MD aware needs HHPT order.       Expected Discharge Plan: Home/Self Care Barriers to Discharge: Continued Medical Work up  Expected Discharge Plan and Services In-house Referral: Clinical Social Work     Living arrangements for the past 2 months: [place]: PT [place]: [place] Date [place]: 02/02/22 Time [place]: 1030 Representative spoke with at [place]: [place] Interventions SDOH Screenings   Tobacco Use: High Risk (01/29/2022)    Readmission Risk Interventions    02/02/2022    8:39 AM  Readmission Risk Prevention Plan  Transportation Screening Complete  HRI or Home Care Consult Complete  Social Work Consult for [place] Planning/Counseling Complete  Palliative Care Screening Not Applicable  Medication Review Press photographer) Complete

## 2022-02-02 NOTE — Assessment & Plan Note (Signed)
-  Continue with nystatin swish and swallow -Magic mouthwash initiated

## 2022-02-02 NOTE — Progress Notes (Signed)
PHARMACY NOTE:  ANTIMICROBIAL RENAL DOSAGE ADJUSTMENT  Current antimicrobial regimen includes a mismatch between antimicrobial dosage and estimated renal function.  As per policy approved by the Pharmacy & Therapeutics and Medical Executive Committees, the antimicrobial dosage will be adjusted accordingly.  Current antimicrobial dosage:  Ciprofloxacin '500mg'$  po BID  Indication: Ecoli/Proteus UTI  Renal Function:   Estimated Creatinine Clearance: 34.4 mL/min (A) (by C-G formula based on SCr of 1.25 mg/dL (H)).  Antimicrobial dosage has been changed to:  Ciprofloxacin '250mg'$  po BID  Thank you for allowing pharmacy to be a part of this patient's care.  Lorenso Courier Malcolm, [facility] 02/02/2022 2:09 PM

## 2022-02-03 DIAGNOSIS — N179 Acute kidney failure, unspecified: Secondary | ICD-10-CM | POA: Diagnosis not present

## 2022-02-03 LAB — BASIC METABOLIC PANEL
Anion gap: 11 (ref 5–15)
BUN: 20 mg/dL (ref 8–23)
CO2: 30 mmol/L (ref 22–32)
Calcium: 8.2 mg/dL — ABNORMAL LOW (ref 8.9–10.3)
Chloride: 94 mmol/L — ABNORMAL LOW (ref 98–111)
Creatinine, Ser: 1.06 mg/dL — ABNORMAL HIGH (ref 0.44–1.00)
GFR, Estimated: 56 mL/min — ABNORMAL LOW (ref >60)
Glucose, Bld: 148 mg/dL — ABNORMAL HIGH (ref 70–99)
Potassium: 3.2 mmol/L — ABNORMAL LOW (ref 3.5–5.1)
Sodium: 135 mmol/L (ref 135–145)

## 2022-02-03 LAB — URINE CULTURE: Culture: >=100000 — AB

## 2022-02-03 LAB — CBC
HCT: 31.1 % — ABNORMAL LOW (ref 36.0–46.0)
Hemoglobin: 9.9 g/dL — ABNORMAL LOW (ref 12.0–15.0)
MCH: 26 pg (ref 26.0–34.0)
MCHC: 31.8 g/dL (ref 30.0–36.0)
MCV: 81.6 fL (ref 80.0–100.0)
Platelets: 117 10*3/uL — ABNORMAL LOW (ref 150–400)
RBC: 3.81 MIL/uL — ABNORMAL LOW (ref 3.87–5.11)
RDW: 17.1 % — ABNORMAL HIGH (ref 11.5–15.5)
WBC: 2.8 10*3/uL — ABNORMAL LOW (ref 4.0–10.5)
nRBC: 2.5 % — ABNORMAL HIGH (ref 0.0–0.2)

## 2022-02-03 MED ORDER — VITAMIN C 500 MG PO TABS
500.0000 mg | ORAL_TABLET | Freq: Every day | ORAL | Status: DC
  Administered 2022-02-03: 500 mg via ORAL
  Filled 2022-02-03: qty 1

## 2022-02-03 MED ORDER — RISAQUAD PO CAPS: 2.0000 | ORAL_CAPSULE | Freq: Three times a day (TID) | ORAL | 0 refills | Status: AC

## 2022-02-03 MED ORDER — POTASSIUM CHLORIDE CRYS ER 20 MEQ PO TBCR
40.0000 meq | EXTENDED_RELEASE_TABLET | Freq: Once | ORAL | Status: AC
  Administered 2022-02-03: 40 meq via ORAL
  Filled 2022-02-03: qty 2

## 2022-02-03 MED ORDER — CYANOCOBALAMIN 1000 MCG PO TABS: 1000.0000 ug | ORAL_TABLET | Freq: Every day | ORAL | 0 refills | Status: AC

## 2022-02-03 MED ORDER — MAGIC MOUTHWASH W/LIDOCAINE: 5.0000 mL | Freq: Three times a day (TID) | ORAL | 0 refills | Status: AC

## 2022-02-03 MED ORDER — ZINC OXIDE 20 % EX OINT: TOPICAL_OINTMENT | Freq: Three times a day (TID) | CUTANEOUS | 0 refills | Status: DC | PRN

## 2022-02-03 MED ORDER — LIDOCAINE VISCOUS HCL 2 % MT SOLN: 15.0000 mL | OROMUCOSAL | 1 refills | Status: AC | PRN

## 2022-02-03 MED ORDER — ZINC OXIDE 40 % EX OINT: TOPICAL_OINTMENT | Freq: Two times a day (BID) | CUTANEOUS | 0 refills | Status: DC

## 2022-02-03 MED ORDER — METHYLPREDNISOLONE 4 MG PO TBPK: ORAL_TABLET | ORAL | 0 refills | Status: DC

## 2022-02-03 MED ORDER — FERROUS SULFATE 325 (65 FE) MG PO TABS: 325.0000 mg | ORAL_TABLET | Freq: Two times a day (BID) | ORAL | 1 refills | Status: AC

## 2022-02-03 MED ORDER — ASCORBIC ACID 500 MG PO TABS: 500.0000 mg | ORAL_TABLET | Freq: Every day | ORAL | 0 refills | Status: AC

## 2022-02-03 MED ORDER — CIPROFLOXACIN HCL 250 MG PO TABS: 250.0000 mg | ORAL_TABLET | Freq: Two times a day (BID) | ORAL | 0 refills | Status: AC

## 2022-02-03 MED ORDER — PHENOL 1.4 % MT LIQD: 1.0000 | OROMUCOSAL | 0 refills | Status: DC | PRN

## 2022-02-03 NOTE — Discharge Summary (Signed)
Physician Discharge Summary   Patient: Rhonda Briggs MRN: [id number] DOB: [birth year]  Admit date:     01/29/2022  Discharge date: 02/03/22  Discharge Physician: Deatra James   PCP: Danna Hefty, DO   Recommendations at discharge:   Follow-up with your PCP within 1-2 weeks Follow-up with your rheumatologist in 1-2 weeks Continue currently recommended medication Will hold your methotrexate till seen by your rheumatologist-dosage needs to be clarified adjusted Continue home health PT, fall precautions Skin care per nursing instructions  Discharge Diagnoses: Principal Problem:   AKI (acute kidney injury) ([place]) Active Problems:   Hypokalemia   Pancytopenia ([place])   COPD with acute exacerbation ([place])   Tobacco use disorder   Hyperlipidemia   Diarrhea   SOB (shortness of breath)   B12 deficiency   Mucosal irritation of oral cavity   UTI (urinary tract infection)  Resolved Problems:   * No resolved hospital problems. *  Hospital Course: Rhonda Briggs is a 72 y.o. female with medical history significant of anemia, COPD, hyperlipidemia, hypertension, tobacco use disorder, and more presents the ED with a chief complaint of shortness of breath.  Patient initially presented with cough, wheeze, dyspnea with exertion.  She improved with treatment in the ER, and does not remember to discuss that without being prompted.  She reports that her cough is productive of clear sputum.  She denies chest pain.  She reports that she was wheezing, but that seems to improve.  Her cough started about 3 days ago.  It was getting worse since it started.  Her dyspnea was worse on exertion.  She reports she went to one of the med center as needed a throat culture on the third and determined that she did not have strep.  Today her flu a, B, RSV, COVID are negative.  Will continue her treatment for COPD.   She is Concern is her GI symptoms.  She reports she has ulcers in her mouth that started on her lips  and moved to her tongue.  This started about 5 days ago.  She has not tried anything for them at home.  So painful that she is unable to eat the crackers that she has at bedside.  She has never had ulcers like that before.  Patient reports that she has had some nausea, vomiting, diarrhea at home.  The nausea and vomiting is 3-4 times a day and is nonbloody.  Associated with abdominal cramping that is intermittent.  She reports she has diarrhea 4-5 times per day.  It is also nonbloody but she does report melena.  FOBT card at bedside for patient's next bowel movement.  Patient reports that she has not eaten anything out of the ordinary for her.  No one around her is sick.  She denies a fever.  Patient has no other complaints at this time.   Review of systems she does report dysuria which started 3 days ago.  She denies hematuria.  UA ordered at admission   Patient smokes 7-10 cigarettes/day.  She does not drink.  She does not have her COVID-vaccine.  Patient is DNR.  ED: Temp 98.2, HR 89-105, RR 14-23, blood pressure 112/48-145/76, satting 96% Leukopenic with a white blood cell count of 3.1, hemoglobin 12.2 Hyponatremic with a sodium of 132, elevated creatinine 2.09 Negative COVID and flu Chest x-ray close no acute disease GI panel pending, C. difficile PCR pending EKG shows a heart rate of 104, sinus tach, QTc 449 Admission requested for further management  of dehydration workup this intractable diarrhea   * AKI (acute kidney injury) ([place]) - Much improved  - creatinine 1.14>> 2.09 >> 1.13 , 1.25 - Prerenal in the setting of dehydration secondary to GI losses - Hold nephrotoxic agents when possible - IV  fluids... Discontinued  Lab Results  Component Value Date   CREATININE 1.25 (H) 02/02/2022   CREATININE 1.17 (H) 02/01/2022   CREATININE 1.13 (H) 01/31/2022      Pancytopenia ([facility]) - Likely exacerbated by upper respiratory-viral infection -Monitoring closely     Latest Ref Rng &  Units 02/02/2022    6:33 AM 02/01/2022    5:01 AM 01/31/2022    7:37 AM  CBC  WBC 4.0 - 10.5 K/uL 1.8  1.0  1.5   Hemoglobin 12.0 - 15.0 g/dL 10.3  8.9  9.3   Hematocrit 36.0 - 46.0 % 32.9  27.9  29.9   Platelets 150 - 400 K/uL 169  161  193    Iron/TIBC/Ferritin/ %Sat    Component Value Date/Time   IRON 21 (L) 02/01/2022 0501   TIBC 211 (L) 02/01/2022 0501   IRONPCTSAT 10 (L) 02/01/2022 0501   -Initiating IV iron x 1 then supplement iron p.o. -Also low B12, initiating subcu B12 x 110 initiating oral   Hypokalemia - Monitoring repleting orally -Checking magnesium level  UTI (urinary tract infection) -Urine culture positive for E. coli greater than 100 K colonies -Switching azithromycin to p.o. ciprofloxacin  Mucosal irritation of oral cavity -Continue with nystatin swish and swallow -Magic mouthwash initiated  B12 deficiency - Initiate B12 supplements, followed by p.o.  SOB (shortness of breath) - Improved - Exacerbated by underlying COPD, likely upper respiratory infection-viral -Treating underlying causes, continue DuoNeb treatment, supplemental oxygen as needed -Mucolytic's   Diarrhea - Resolved -Reported 1 loose stool overnight - Patient skin is raw from diarrhea - Wound consult - GI pathogen and C. difficile pending - No diarrhea since being in the ER, - Groin skin rash because of diarrhea, raw-Desitin, zinc oxide cream ordered  Hyperlipidemia - Continue pravastatin  Tobacco use disorder - Counseled on importance of cessation - Patient reports she has not had a cigarette since symptoms started, some issues while on her road to recovery at this time  COPD with acute exacerbation ([place]) -- Improved remain on room air percent - Counseled on importance of smoking cessation - Continue Symbicort and Incruse -IV Solu-Medrol initiated >>. tapering down      Disposition: Home Diet recommendation:  Discharge Diet Orders (From admission, onward)     Start      Ordered   02/03/22 0000  Diet - low sodium heart healthy        02/03/22 1111           Cardiac diet DISCHARGE MEDICATION: Allergies as of 02/03/2022       Reactions   Amoxicillin Nausea And Vomiting        Medication List     STOP taking these medications    Bismuth 262 MG Chew   budesonide-formoterol 160-4.5 MCG/ACT inhaler Commonly known as: SYMBICORT   hydrochlorothiazide 25 MG tablet Commonly known as: HYDRODIURIL   hydroxychloroquine 200 MG tablet Commonly known as: PLAQUENIL   methotrexate 2.5 MG tablet Commonly known as: RHEUMATREX   polyethylene glycol-electrolytes 420 g solution Commonly known as: TriLyte   predniSONE 10 MG tablet Commonly known as: DELTASONE       TAKE these medications    acidophilus Caps capsule Take 2 capsules by  mouth 3 (three) times daily for 5 days.   ascorbic acid 500 MG tablet Commonly known as: VITAMIN C Take 1 tablet (500 mg total) by mouth daily for 5 days. Start taking on: February 04, 2022   CENTRUM SILVER 50+WOMEN PO Take 1 tablet by mouth daily in the afternoon. With vitamin D   ciprofloxacin 250 MG tablet Commonly known as: CIPRO Take 1 tablet (250 mg total) by mouth 2 (two) times daily for 5 days.   cyanocobalamin 1000 MCG tablet Take 1 tablet (1,000 mcg total) by mouth daily. Start taking on: February 04, 2022   ferrous sulfate 325 (65 FE) MG tablet Take 1 tablet (325 mg total) by mouth 2 (two) times daily with a meal.   folic acid 1 MG tablet Commonly known as: FOLVITE Take 1 tablet (1 mg total) by mouth daily.   gabapentin 100 MG capsule Commonly known as: NEURONTIN Take 100 mg by mouth in the morning, at noon, and at bedtime.   Incruse Ellipta 62.5 MCG/ACT Aepb Generic drug: umeclidinium bromide Inhale 1 puff into the lungs daily.   lidocaine 2 % solution Commonly known as: XYLOCAINE Use as directed 15 mLs in the mouth or throat every 3 (three) hours as needed for up to 7 days for mouth  pain.   liver oil-zinc oxide 40 % ointment Commonly known as: DESITIN Apply topically 2 (two) times daily.   zinc oxide 20 % ointment Apply topically 3 (three) times daily as needed for irritation.   magic mouthwash w/lidocaine Soln Take 5 mLs by mouth 3 (three) times daily for 7 days.   methylPREDNISolone 4 MG Tbpk tablet Commonly known as: MEDROL DOSEPAK Medrol Dosepak take as instructed   nicotine 14 mg/24hr patch Commonly known as: NICODERM CQ - dosed in mg/24 hours Place 1 patch (14 mg total) onto the skin daily.   pantoprazole 40 MG tablet Commonly known as: Protonix Take 1 tablet (40 mg total) by mouth 2 (two) times daily. What changed: when to take this   phenol 1.4 % Liqd Commonly known as: CHLORASEPTIC Use as directed 1 spray in the mouth or throat as needed for throat irritation / pain.   pramipexole 0.75 MG tablet Commonly known as: MIRAPEX Take 0.75 mg by mouth daily.   pravastatin 20 MG tablet Commonly known as: PRAVACHOL Take 20 mg by mouth daily.   Ventolin HFA 108 (90 Base) MCG/ACT inhaler Generic drug: albuterol Inhale 2 puffs into the lungs every 6 (six) hours as needed for wheezing or shortness of breath.               Discharge Care Instructions  (From admission, onward)           Start     Ordered   02/03/22 0000  Discharge wound care:       Comments: Continue wound care per instructions   02/03/22 1111            Follow-up Information     [facility] Follow up.   Why: Will contact you to schedule home health visits. Contact information: [place] [address] [phone]                Discharge Exam: Danley Danker Weights   01/29/22 2103 01/30/22 1300  Weight: 66.7 kg 70.7 kg        General:  AAO x 3,  cooperative, no distress;   HEENT:  Lip/oral lesions erythema, negative for any ulcers bleeding no  white flaky discharge normocephalic, PERRL, otherwise with in Normal limits    Neuro:  CNII-XII intact. , normal motor and sensation, reflexes intact   Lungs:   Clear to auscultation BL, Respirations unlabored,  Improved but diffuse wheezes/rhonchi/ No crackles  Cardio:    S1/S2, RRR, No murmure, No Rubs or Gallops   Abdomen:  Soft, non-tender, bowel sounds active all four quadrants, no guarding or peritoneal signs.  Muscular  skeletal:  Limited exam -global generalized weaknesses - in bed, able to move all 4 extremities,   2+ pulses,  symmetric, No pitting edema  Skin:  Dry, warm to touch, extensive erythema groin area  Wounds: Please see nursing documentation          Condition at discharge: good  The results of significant diagnostics from this hospitalization (including imaging, microbiology, ancillary and laboratory) are listed below for reference.   Imaging Studies: DG Chest 2 View  Result Date: 01/29/2022 CLINICAL DATA:  Shortness of breath history of COVID EXAM: CHEST - 2 VIEW COMPARISON:  CT 03/17/2021 FINDINGS: Emphysema with bronchitic changes. No acute consolidation or effusion. Normal cardiac size. No pneumothorax. IMPRESSION: No active cardiopulmonary disease. Emphysema with bronchitic changes. Electronically Signed   By: Donavan Foil M.D.   On: 01/29/2022 21:39    Microbiology: Results for orders placed or performed during the hospital encounter of 01/29/22  Resp panel by RT-PCR (RSV, Flu A&B, Covid) Anterior Nasal Swab     Status: None   Collection Time: 01/29/22  9:13 PM   Specimen: Anterior Nasal Swab  Result Value Ref Range Status   SARS Coronavirus 2 by RT PCR NEGATIVE NEGATIVE Final    Comment: (NOTE) SARS-CoV-2 target nucleic acids are NOT DETECTED.  The SARS-CoV-2 RNA is generally detectable in upper respiratory specimens during the acute phase of infection. The lowest concentration of SARS-CoV-2 viral copies this assay can detect is 138 copies/mL. A negative result does not preclude SARS-Cov-2 infection and should not be used  as the sole basis for treatment or other patient management decisions. A negative result may occur with  improper specimen collection/handling, submission of specimen other than nasopharyngeal swab, presence of viral mutation(s) within the areas targeted by this assay, and inadequate number of viral copies(<138 copies/mL). A negative result must be combined with clinical observations, patient history, and epidemiological information. The expected result is Negative.  Fact Sheet for Patients:  EntrepreneurPulse.com.au  Fact Sheet for Healthcare Providers:  IncredibleEmployment.be  This test is no t yet approved or cleared by the [place] FDA and  has been authorized for detection and/or diagnosis of SARS-CoV-2 by FDA under an Emergency Use Authorization (EUA). This EUA will remain  in effect (meaning this test can be used) for the duration of the COVID-19 declaration under Section 564(b)(1) of the Act, 21 U.S.C.section 360bbb-3(b)(1), unless the authorization is terminated  or revoked sooner.       Influenza A by PCR NEGATIVE NEGATIVE Final   Influenza B by PCR NEGATIVE NEGATIVE Final    Comment: (NOTE) The Xpert Xpress SARS-CoV-2/FLU/RSV plus assay is intended as an aid in the diagnosis of influenza from Nasopharyngeal swab specimens and should not be used as a sole basis for treatment. Nasal washings and aspirates are unacceptable for Xpert Xpress SARS-CoV-2/FLU/RSV testing.  Fact Sheet for Patients: EntrepreneurPulse.com.au  Fact Sheet for Healthcare Providers: IncredibleEmployment.be  This test is not yet approved or cleared by the [place] FDA and has been authorized for detection and/or diagnosis of SARS-CoV-2 by FDA  under an Emergency Use Authorization (EUA). This EUA will remain in effect (meaning this test can be used) for the duration of the COVID-19 declaration under Section 564(b)(1)  of the Act, 21 U.S.C. section 360bbb-3(b)(1), unless the authorization is terminated or revoked.     Resp Syncytial Virus by PCR NEGATIVE NEGATIVE Final    Comment: (NOTE) Fact Sheet for Patients: EntrepreneurPulse.com.au  Fact Sheet for Healthcare Providers: IncredibleEmployment.be  This test is not yet approved or cleared by the [place] FDA and has been authorized for detection and/or diagnosis of SARS-CoV-2 by FDA under an Emergency Use Authorization (EUA). This EUA will remain in effect (meaning this test can be used) for the duration of the COVID-19 declaration under Section 564(b)(1) of the Act, 21 U.S.C. section 360bbb-3(b)(1), unless the authorization is terminated or revoked.  Performed at [facility], [address], [place], [place] [address]   Urine Culture     Status: Abnormal   Collection Time: 01/31/22  8:00 AM   Specimen: Urine, Clean Catch  Result Value Ref Range Status   Specimen Description   Final    URINE, CLEAN CATCH Performed at [facility], [address] [place], [place] [address]    Special Requests   Final    NONE Performed at [facility], [address], [place], [place] [address]    Culture (A)  Final    >=100,000 COLONIES/mL ESCHERICHIA COLI 50,000 COLONIES/mL PROTEUS MIRABILIS    Report Status 02/03/2022 FINAL  Final   Organism ID, Bacteria ESCHERICHIA COLI (A)  Final   Organism ID, Bacteria PROTEUS MIRABILIS (A)  Final      Susceptibility   Escherichia coli - MIC*    AMPICILLIN >=32 RESISTANT Resistant     CEFAZOLIN <=4 SENSITIVE Sensitive     CEFEPIME <=0.12 SENSITIVE Sensitive     CEFTRIAXONE <=0.25 SENSITIVE Sensitive     CIPROFLOXACIN <=0.25 SENSITIVE Sensitive     GENTAMICIN <=1 SENSITIVE Sensitive     IMIPENEM <=0.25 SENSITIVE Sensitive     NITROFURANTOIN <=16 SENSITIVE Sensitive     TRIMETH/SULFA >=320 RESISTANT Resistant     AMPICILLIN/SULBACTAM 16 INTERMEDIATE Intermediate      PIP/TAZO <=4 SENSITIVE Sensitive     * >=100,000 COLONIES/mL ESCHERICHIA COLI   Proteus mirabilis - MIC*    AMPICILLIN <=2 SENSITIVE Sensitive     CEFAZOLIN <=4 SENSITIVE Sensitive     CEFEPIME <=0.12 SENSITIVE Sensitive     CEFTRIAXONE <=0.25 SENSITIVE Sensitive     CIPROFLOXACIN <=0.25 SENSITIVE Sensitive     GENTAMICIN <=1 SENSITIVE Sensitive     IMIPENEM 2 SENSITIVE Sensitive     NITROFURANTOIN 128 RESISTANT Resistant     TRIMETH/SULFA <=20 SENSITIVE Sensitive     AMPICILLIN/SULBACTAM <=2 SENSITIVE Sensitive     PIP/TAZO <=4 SENSITIVE Sensitive     * 50,000 COLONIES/mL PROTEUS MIRABILIS    Labs: CBC: Recent Labs  Lab 01/29/22 2106 01/30/22 0503 01/31/22 0737 02/01/22 0501 02/02/22 0633 02/03/22 0413  WBC 3.1* 2.0* 1.5* 1.0* 1.8* 2.8*  NEUTROABS 1.6* 1.5*  --   --   --   --   HGB 12.2 10.1* 9.3* 8.9* 10.3* 9.9*  HCT 38.0 32.3* 29.9* 27.9* 32.9* 31.1*  MCV 81.7 82.0 83.8 83.0 82.9 81.6  PLT 327 248 193 161 169 962*   Basic Metabolic Panel: Recent Labs  Lab 01/30/22 0503 01/31/22 0737 02/01/22 0501 02/02/22 0633 02/03/22 0413  NA 135 136 134* 135 135  K 3.5 3.4* 3.4* 3.9 3.2*  CL 97*  99 98 98 94*  CO2 '27 27 26 27 30  '$ GLUCOSE 139* 150* 162* 152* 148*  BUN 62* 34* 27* 25* 20  CREATININE 1.99* 1.13* 1.17* 1.25* 1.06*  CALCIUM 8.3* 8.4* 8.3* 8.7* 8.2*  MG 1.8  --   --   --   --    Liver Function Tests: Recent Labs  Lab 01/30/22 0503 01/31/22 0737 02/01/22 0501 02/02/22 0633  AST 10* 12* 8* 17  ALT '9 11 12 18  '$ ALKPHOS 56 45 41 53  BILITOT 1.2 0.9 0.6 0.6  PROT 5.9* 5.4* 5.1* 6.1*  ALBUMIN 3.0* 2.6* 2.4* 2.8*   CBG: No results for input(s): "GLUCAP" in the last 168 hours.  Discharge time spent: greater than 30 minutes.  Signed: Deatra James, MD [facility] 02/03/2022

## 2022-02-03 NOTE — Care Management Important Message (Signed)
Important Message  Patient Details  Name: Rhonda Briggs MRN: [id number] Date of Birth: [birth year]   Medicare Important Message Given:  Yes     [name] 02/03/2022, 1:41 PM

## 2022-02-03 NOTE — TOC Transition Note (Signed)
Transition of Care [facility]) - CM/SW Discharge Note   Patient Details  Name: Rhonda Briggs MRN: [id number] Date of Birth: Oct 26, 1950  Transition of Care [facility]) CM/SW Contact:  Shade Flood, LCSW Phone Number: 02/03/2022, 11:34 AM   Clinical Narrative:     Pt stable for dc today per MD. Plan remains for dc home with [facility]. Updated [name] at [employer] of pt's dc.  No other TOC needs for dc.  Final next level of care: [place] Barriers to Discharge: Barriers Resolved   Patient Goals and CMS Choice   Choice offered to / list presented to : Patient  Discharge Placement                         Discharge Plan and Services Additional resources added to the After Visit Summary for   In-house Referral: Clinical Social Work                        HH Arranged: PT [facility] Agency: [place] Date [place]: 02/02/22 Time [place]: 1030 Representative spoke with at [place]: [place] ([place]) Interventions SDOH Screenings   Tobacco Use: High Risk (01/29/2022)     Readmission Risk Interventions    02/02/2022    8:39 AM  Readmission Risk Prevention Plan  Transportation Screening Complete  HRI or [place] Complete  Social Work Consult for [place] Planning/Counseling Complete  Palliative Care Screening Not Applicable  Medication Review Press photographer) Complete

## 2022-02-26 ENCOUNTER — Other Ambulatory Visit (HOSPITAL_COMMUNITY): Payer: Self-pay | Admitting: Family Medicine

## 2022-02-26 DIAGNOSIS — R012 Other cardiac sounds: Secondary | ICD-10-CM

## 2022-03-23 ENCOUNTER — Ambulatory Visit (HOSPITAL_COMMUNITY)
Admission: RE | Admit: 2022-03-23 | Discharge: 2022-03-23 | Disposition: A | Discharge status: Home / Self Care | Payer: Medicare HMO | Source: Ambulatory Visit | Attending: Family Medicine | Admitting: Family Medicine

## 2022-03-23 DIAGNOSIS — R012 Other cardiac sounds: Secondary | ICD-10-CM | POA: Insufficient documentation

## 2022-03-23 LAB — ECHOCARDIOGRAM COMPLETE
AR max vel: 2.57 cm2
AV Area VTI: 2.74 cm2
AV Area mean vel: 2.93 cm2
AV Mean grad: 6.2 mmHg
AV Peak grad: 12.2 mmHg
Ao pk vel: 1.75 m/s
Area-P 1/2: 3.85 cm2
S' Lateral: 2.5 cm

## 2022-03-23 NOTE — Progress Notes (Signed)
*  PRELIMINARY RESULTS* Echocardiogram 2D Echocardiogram has been performed.  Rhonda Briggs 03/23/2022, 2:49 PM

## 2022-03-26 ENCOUNTER — Encounter: Payer: Self-pay | Admitting: Radiology

## 2022-05-20 ENCOUNTER — Ambulatory Visit: Payer: Medicare HMO | Admitting: "Endocrinology

## 2022-05-27 ENCOUNTER — Telehealth: Payer: Self-pay | Admitting: "Endocrinology

## 2022-05-27 DIAGNOSIS — E049 Nontoxic goiter, unspecified: Secondary | ICD-10-CM

## 2022-05-27 NOTE — Telephone Encounter (Signed)
Labs updated

## 2022-05-27 NOTE — Telephone Encounter (Signed)
Can you update labs 

## 2022-06-04 LAB — TSH: TSH: 1.2 u[IU]/mL (ref 0.450–4.500)

## 2022-06-04 LAB — T3, FREE: T3, Free: 3.1 pg/mL (ref 2.0–4.4)

## 2022-06-04 LAB — T4, FREE: Free T4: 1.47 ng/dL (ref 0.82–1.77)

## 2022-06-10 ENCOUNTER — Encounter: Payer: Self-pay | Admitting: "Endocrinology

## 2022-06-10 ENCOUNTER — Ambulatory Visit: Payer: Medicare HMO | Admitting: "Endocrinology

## 2022-06-10 VITALS — BP 130/88 | HR 88 | Ht 60.0 in | Wt 139.0 lb

## 2022-06-10 DIAGNOSIS — E049 Nontoxic goiter, unspecified: Secondary | ICD-10-CM | POA: Diagnosis not present

## 2022-06-10 DIAGNOSIS — F17200 Nicotine dependence, unspecified, uncomplicated: Secondary | ICD-10-CM

## 2022-06-10 NOTE — Progress Notes (Signed)
06/10/2022, 12:47 PM   Endocrinology follow-up note  Subjective:    Patient ID: Rhonda Briggs, female    DOB: 1950-06-14, PCP Joana Reamer, DO   Past Medical History:  Diagnosis Date   Anemia    Arthritis    Asthma    COPD (chronic obstructive pulmonary disease) (HCC)    History of kidney stones    Hyperlipidemia    Hypertension    Osteoporosis    PONV (postoperative nausea and vomiting)    after hernia surgery   Past Surgical History:  Procedure Laterality Date   BIOPSY  03/18/2021   Procedure: BIOPSY;  Surgeon: Dolores Frame, MD;  [facility]: [facility];  Service: Gastroenterology;;   Fidela Salisbury RELEASE Right 2011   COLONOSCOPY WITH PROPOFOL N/A 03/18/2021   Procedure: COLONOSCOPY WITH PROPOFOL;  Surgeon: Dolores Frame, MD;  [facility]: [facility];  Service: Gastroenterology;  Laterality: N/A;   COLONOSCOPY WITH PROPOFOL N/A 06/06/2021   Procedure: COLONOSCOPY WITH PROPOFOL;  Surgeon: Lanelle Bal, DO;  [facility]: [facility];  Service: Endoscopy;  Laterality: N/A;  2:00pm   ESOPHAGOGASTRODUODENOSCOPY (EGD) WITH PROPOFOL N/A 03/18/2021   Procedure: ESOPHAGOGASTRODUODENOSCOPY (EGD) WITH PROPOFOL;  Surgeon: Dolores Frame, MD;  [facility];  Service: Gastroenterology;  Laterality: N/A;   ESOPHAGOGASTRODUODENOSCOPY (EGD) WITH PROPOFOL N/A 06/06/2021   Procedure: ESOPHAGOGASTRODUODENOSCOPY (EGD) WITH PROPOFOL;  Surgeon: Lanelle Bal, DO;  [facility];  Service: Endoscopy;  Laterality: N/A;   GIVENS CAPSULE STUDY N/A 03/19/2021   Procedure: GIVENS CAPSULE STUDY;  Surgeon: Malissa Hippo, MD;  [facility]: [facility];  Service: Endoscopy;  Laterality: N/A;   HERNIA REPAIR Left    inguinal   TOTAL KNEE ARTHROPLASTY Right 08/19/2016   Procedure: TOTAL KNEE ARTHROPLASTY;  Surgeon: Vickki Hearing, MD;  Location: [facility];  Service: Orthopedics;   Laterality: Right;   TOTAL KNEE ARTHROPLASTY Left 12/31/2016   Procedure: TOTAL KNEE ARTHROPLASTY;  Surgeon: Vickki Hearing, MD;  Location: [facility];  Service: Orthopedics;  Laterality: Left;   TUBAL LIGATION     Social History   Socioeconomic History   Marital status: Widowed    Spouse name: Not on file   Number of children: Not on file   Years of education: Not on file   Highest education level: Not on file  Occupational History   Not on file  Tobacco Use   Smoking status: Every Day    Packs/day: 0.50    Years: 48.00    Additional pack years: 0.00    Total pack years: 24.00    Types: Cigarettes   Smokeless tobacco: Never   Tobacco comments:    smokes 10 cigarettes per day 03/05/2020  Vaping Use   Vaping Use: Never used  Substance and Sexual Activity   Alcohol use: No   Drug use: No   Sexual activity: Yes    Birth control/protection: Surgical  Other Topics Concern   Not on file  Social History Narrative   Not on file   Social Determinants of Health   Financial Resource Strain: Not on file  Food Insecurity: Not on file  Transportation Needs: Not on file  Physical Activity: Not on file  Stress: Not on file  Social Connections: Not on file   Family History  Problem Relation Age of Onset   Hypertension Mother    Lung disease Mother    Cancer Father    Urticaria Neg Hx    Eczema Neg Hx    Immunodeficiency Neg Hx    Atopy Neg Hx    Asthma Neg Hx    Angioedema Neg Hx    Allergic rhinitis Neg Hx    Outpatient Encounter Medications as of 06/10/2022  Medication Sig   Adalimumab (HUMIRA, 2 PEN,) 40 MG/0.4ML PNKT every 14 (fourteen) days.   alendronate (FOSAMAX) 70 MG tablet Take 70 mg by mouth once a week.   ferrous sulfate 325 (65 FE) MG tablet Take 1 tablet (325 mg total) by mouth 2 (two) times daily with a meal.   folic acid (FOLVITE) 1 MG tablet Take 1 tablet (1 mg total) by mouth daily.   gabapentin (NEURONTIN) 100 MG capsule Take 100 mg by mouth in the  morning, at noon, and at bedtime.   hydroxychloroquine (PLAQUENIL) 200 MG tablet Take 200 mg by mouth 2 (two) times daily.   INCRUSE ELLIPTA 62.5 MCG/ACT AEPB Inhale 1 puff into the lungs daily.   methylPREDNISolone (MEDROL DOSEPAK) 4 MG TBPK tablet Medrol Dosepak take as instructed   Multiple Vitamins-Minerals (CENTRUM SILVER 50+WOMEN PO) Take 1 tablet by mouth daily in the afternoon. With vitamin D   pantoprazole (PROTONIX) 40 MG tablet Take 1 tablet (40 mg total) by mouth 2 (two) times daily. (Patient taking differently: Take 40 mg by mouth daily.)   pramipexole (MIRAPEX) 0.75 MG tablet Take 0.75 mg by mouth daily.   pravastatin (PRAVACHOL) 20 MG tablet Take 20 mg by mouth daily.   VENTOLIN HFA 108 (90 Base) MCG/ACT inhaler Inhale 2 puffs into the lungs every 6 (six) hours as needed for wheezing or shortness of breath.   [DISCONTINUED] liver oil-zinc oxide (DESITIN) 40 % ointment Apply topically 2 (two) times daily.   [DISCONTINUED] nicotine (NICODERM CQ - DOSED IN MG/24 HOURS) 14 mg/24hr patch Place 1 patch (14 mg total) onto the skin daily. (Patient not taking: Reported on 01/30/2022)   [DISCONTINUED] phenol (CHLORASEPTIC) 1.4 % LIQD Use as directed 1 spray in the mouth or throat as needed for throat irritation / pain.   [DISCONTINUED] zinc oxide 20 % ointment Apply topically 3 (three) times daily as needed for irritation.   No facility-administered encounter medications on file as of 06/10/2022.   ALLERGIES: Allergies  Allergen Reactions   Amoxicillin Nausea And Vomiting    VACCINATION STATUS: Immunization History  Administered Date(s) Administered   Fluad Quad(high Dose 65+) 11/16/2019   Moderna Sars-Covid-2 Vaccination 02/17/2019, 03/15/2019   Pneumococcal Polysaccharide-23 11/16/2019    Rhonda Briggs is 72 y.o. female who presents today with a medical history as above. she is being seen in follow-up with repeat thyroid function tests after this patient was seen for nodular  goiter.  Prior to her last visit, she underwent fine-needle aspiration of a thyroid nodule with benign findings. PCP:  Joana Reamer, DO. See notes from the previous visit.  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  3.2 cm suspicious nodule from the left lobe was biopsied on October 09, 2020.  Biopsy findings were consistent with atypia of undetermined significance.  A sample was sent for Afirma molecular testing which returned approximately 4% risk of malignancy.  This is considered benign finding.  She presents  with previsit thyroid function tests showing euthyroid state.      She remains a chronic smoker.  She does not have acute complaints today.  She denies family history of thyroid malignancy.     She reports cough, wheezing, and dyspnea on exertion.  She denies dysphagia.  Her previsit thyroid function tests are consistent with euthyroid state.   She also gives history of prediabetes in the past.  She is not on any intervention.   Review of Systems  Constitutional: + Minimally fluctuating body weight, +fatigue, no subjective hyperthermia, no subjective hypothermia   Objective:       06/10/2022   10:31 AM 02/03/2022   12:50 PM 02/03/2022    4:47 AM  Vitals with BMI  Height 5\' 0"     Weight 139 lbs    BMI 27.15    Systolic 130 153 098  Diastolic 88 63 62  Pulse 88 98 91    BP 130/88   Pulse 88   Ht 5' (1.524 m)   Wt 139 lb (63 kg)   BMI 27.15 kg/m   Wt Readings from Last 3 Encounters:  06/10/22 139 lb (63 kg)  01/30/22 155 lb 13.8 oz (70.7 kg)  06/04/21 162 lb 12.8 oz (73.8 kg)    Physical Exam  Constitutional:  Body mass index is 27.15 kg/m.,  not in acute distress, normal state of mind Eyes: PERRLA, EOMI, no exophthalmos ENT: moist mucous membranes, no gross thyromegaly, no gross cervical lymphadenopathy   CMP ( most recent) CMP     Component Value Date/Time   NA 135 02/03/2022 0413   K 3.2 (L) 02/03/2022 0413   CL  94 (L) 02/03/2022 0413   CO2 30 02/03/2022 0413   GLUCOSE 148 (H) 02/03/2022 0413   BUN 20 02/03/2022 0413   CREATININE 1.06 (H) 02/03/2022 0413   CALCIUM 8.2 (L) 02/03/2022 0413   PROT 6.1 (L) 02/02/2022 0633   ALBUMIN 2.8 (L) 02/02/2022 0633   AST 17 02/02/2022 0633   ALT 18 02/02/2022 0633   ALKPHOS 53 02/02/2022 0633   BILITOT 0.6 02/02/2022 0633   GFRNONAA 56 (L) 02/03/2022 0413   GFRAA 60 (L) 01/01/2017 0427    Latest Reference Range & Units 11/11/20 14:15 05/14/21 13:49  TSH 0.450 - 4.500 uIU/mL 0.825 0.997  Triiodothyronine,Free,Serum 2.0 - 4.4 pg/mL 3.0 2.6  T4,Free(Direct) 0.82 - 1.77 ng/dL 1.19 (H) 1.47  Thyroperoxidase Ab SerPl-aCnc 0 - 34 IU/mL 10   Thyroglobulin Antibody 0.0 - 0.9 IU/mL <1.0   (H): Data is abnormally high  Thyroid ultrasound from March 14, 2020: Right lobe 3.6 cm with no discrete nodule.  Left lobe 4.7 cm with 3.2 cm suspicious nodule.  Fine-needle aspiration on October 09, 2020-atypia of undetermined significance. Afirma molecular study  ~4% risk of malignancy     Assessment & Plan:   1. Nodular goiter  Her recent fine-needle aspiration biopsy was consistent with benign findings.  She presents with euthyroid state, thyroid function test within normal limits.   - I have reviewed her available thyroid records and clinically evaluated the patient.  These findings were reviewed with her.  She will not need intervention with thyroid hormone nor antithyroid intervention at this time.     -She will return in 1 year with repeat thyroid function tests. Regarding her reported history of prediabetes: - she acknowledges that there is a room for improvement in her food and drink choices. - Suggestion is made for her to avoid simple carbohydrates  from her diet including Cakes, Sweet Desserts, Ice Cream, Soda (diet and regular), Sweet Tea, Candies, Chips, Cookies, Store Bought Juices, Alcohol in Excess of  1-2 drinks a day, Artificial Sweeteners,   Coffee Creamer, and "Sugar-free" Products, Lemonade. This will help patient to have more stable blood glucose profile and potentially avoid unintended weight gain. 1 week to review her repeat labs.   The single most important medical decision for her to do will be smoking cessation.  She is in the precontemplative phase.  Will need continuous, subsequent follow-up.  The patient was counseled on the dangers of tobacco use, and was advised to quit.  Reviewed strategies to maximize success, including removing cigarettes and smoking materials from environment.   - she is advised to maintain close follow up with Joana Reamer, DO for primary care needs.  The patient was counseled on the dangers of tobacco use, and was advised to quit.  Reviewed strategies to maximize success, including removing cigarettes and smoking materials from environment.   Follow up plan: Return in about 1 year (around 06/10/2023) for F/U with Pre-visit Labs.   Marquis Lunch, MD [facility] [address] [place], [place] [address] Phone: [phone]  Fax: [phone]     06/10/2022, 12:47 PM  This note was partially dictated with voice recognition software. Similar sounding words can be transcribed inadequately or may not  be corrected upon review.

## 2023-01-27 HISTORY — PX: CATARACT EXTRACTION: SUR2

## 2023-02-04 ENCOUNTER — Telehealth: Payer: Self-pay

## 2023-02-04 ENCOUNTER — Other Ambulatory Visit: Payer: Self-pay

## 2023-02-04 DIAGNOSIS — Z87891 Personal history of nicotine dependence: Secondary | ICD-10-CM

## 2023-02-04 DIAGNOSIS — F17210 Nicotine dependence, cigarettes, uncomplicated: Secondary | ICD-10-CM

## 2023-02-04 DIAGNOSIS — Z122 Encounter for screening for malignant neoplasm of respiratory organs: Secondary | ICD-10-CM

## 2023-02-04 NOTE — Telephone Encounter (Signed)
.  Lung Cancer Screening Narrative/Criteria Questionnaire (Cigarette Smokers Only- No Cigars/Pipes/vapes)   Rhonda Briggs     SDMV:02/10/2023 @ 10:15am [name]  Apr 25, 1950              LDCT: 02/16/2023 @ 10:30am @ [facility]    72 y.o.   Phone: [phone]  Lung Screening Narrative   Before calling, confirm age (50-77 yrs Medicare / 50-80 yrs Private pay insurance)  Insurance information: Paramedic   I am calling at the request of SHAHMEHDI, MD  (referring provider) to schedule you for a lung screening.  Did your provider discuss this with you? No   This screening involves an initial meeting with our NP, who is the Nurse Navigator for the program.  It is called a shared decision making visit.  The initial meeting is required by insurance and Medicare to make sure you understand the program.  This appointment takes about 20 minutes to complete.  After you have spoken with the provider, we will schedule you for your screening scan.  This scan takes about 5-10 minutes to complete.  You can eat and drink normally before and after the scan.    I am going to ask you a few questions to make sure you meet the criteria to participate in the program.    Are you a current or former smoker? Current Age began smoking: 64   If you are a former smoker, what year did you quit smoking? must be within 15 yrs)    To calculate your smoking history, I need an accurate estimate of how many packs of cigarettes you smoked per day and   for how many years.    Years smoking 56 x Packs per day 1 = Pack years 58   (at least 20 pack yrs)   (Make sure they understand that we need to know how much they have smoked in the past, not just the number of PPD they are smoking now)  Do you have a personal history of cancer?  No    Do you have a family history of cancer? Yes  (cancer type and and relative) Father colon   Are you having any of the following symptoms?  Coughing up blood?  No  Weight loss of 15 lbs or more in the  last 6 months without trying / you cannot explain  No  It looks like you meet all criteria.  When would be a good time for us  to schedule you for this screening?   Additional information:

## 2023-02-10 ENCOUNTER — Ambulatory Visit (INDEPENDENT_AMBULATORY_CARE_PROVIDER_SITE_OTHER): Payer: Medicare HMO | Admitting: Adult Health

## 2023-02-10 ENCOUNTER — Encounter: Payer: Self-pay | Admitting: Adult Health

## 2023-02-10 DIAGNOSIS — F17210 Nicotine dependence, cigarettes, uncomplicated: Secondary | ICD-10-CM | POA: Diagnosis not present

## 2023-02-10 NOTE — Progress Notes (Signed)
  Virtual Visit via Telephone Note  I connected with Rhonda Briggs , 02/10/23 10:52 AM by a telemedicine application and verified that I am speaking with the correct person using two identifiers.  Location: Patient: home Provider: home   I discussed the limitations of evaluation and management by telemedicine and the availability of in person appointments. The patient expressed understanding and agreed to proceed.   Shared Decision Making Visit Lung Cancer Screening Program [phone])   Eligibility: 73 y.o. Pack Years Smoking History Calculation = 56 pack years (# packs/per year x # years smoked) Recent History of coughing up blood  no Unexplained weight loss? no ( >Than 15 pounds within the last 6 months ) Prior History Lung / other cancer no (Diagnosis within the last 5 years already requiring surveillance chest CT Scans). Smoking Status Current Smoker   Visit Components: Discussion included one or more decision making aids. YES Discussion included risk/benefits of screening. YES Discussion included potential follow up diagnostic testing for abnormal scans. YES Discussion included meaning and risk of over diagnosis. YES Discussion included meaning and risk of False Positives. YES Discussion included meaning of total radiation exposure. YES  Counseling Included: Importance of adherence to annual lung cancer LDCT screening. YES Impact of comorbidities on ability to participate in the program. YES Ability and willingness to under diagnostic treatment. YES  Smoking Cessation Counseling: Current Smokers:  Discussed importance of smoking cessation. yes Information about tobacco cessation classes and interventions provided to patient. yes Patient provided with "ticket" for LDCT Scan. yes Symptomatic Patient. NO Diagnosis Code: Tobacco Use Z72.0 Asymptomatic Patient yes  Counseling (Intermediate counseling: > three minutes counseling) J8119   Z12.2-Screening of respiratory  organs Z87.891-Personal history of nicotine  dependence   [name] 02/10/23

## 2023-02-10 NOTE — Patient Instructions (Signed)

## 2023-02-16 ENCOUNTER — Ambulatory Visit (HOSPITAL_COMMUNITY): Payer: Medicare HMO

## 2023-03-23 ENCOUNTER — Ambulatory Visit (HOSPITAL_COMMUNITY)
Admission: RE | Admit: 2023-03-23 | Discharge: 2023-03-23 | Disposition: A | Discharge status: Home / Self Care | Payer: Medicare HMO | Source: Ambulatory Visit | Attending: Acute Care | Admitting: Acute Care

## 2023-03-23 DIAGNOSIS — F17210 Nicotine dependence, cigarettes, uncomplicated: Secondary | ICD-10-CM | POA: Insufficient documentation

## 2023-03-23 DIAGNOSIS — Z122 Encounter for screening for malignant neoplasm of respiratory organs: Secondary | ICD-10-CM | POA: Diagnosis present

## 2023-03-23 DIAGNOSIS — Z87891 Personal history of nicotine dependence: Secondary | ICD-10-CM | POA: Diagnosis present

## 2023-04-13 ENCOUNTER — Other Ambulatory Visit: Payer: Self-pay | Admitting: Acute Care

## 2023-04-13 DIAGNOSIS — Z87891 Personal history of nicotine dependence: Secondary | ICD-10-CM

## 2023-04-13 DIAGNOSIS — Z122 Encounter for screening for malignant neoplasm of respiratory organs: Secondary | ICD-10-CM

## 2023-04-13 DIAGNOSIS — F17210 Nicotine dependence, cigarettes, uncomplicated: Secondary | ICD-10-CM

## 2023-04-24 IMAGING — US US FNA BIOPSY THYROID 1ST LESION
1 series · 13 of 15 positions shown · non-contrast
Comparison: US Thyroid 03/14/20

MEDICATIONS:
10 cc 1% lidocaine

COMPLICATIONS:
None immediate.

INDICATION: Left inferior thyroid nodule

3.2 cm
EXAM:
ULTRASOUND GUIDED FINE NEEDLE ASPIRATION OF INDETERMINATE THYROID
NODULE
TECHNIQUE: Informed written consent was obtained from the patient after a
discussion of the risks, benefits and alternatives to treatment.
Questions regarding the procedure were encouraged and answered. A
timeout was performed prior to the initiation of the procedure.

[Series 1: us fna bx thyroid 1st lesion afirma · 15 acquisitions, 13 frames shown]
[im 1/15]
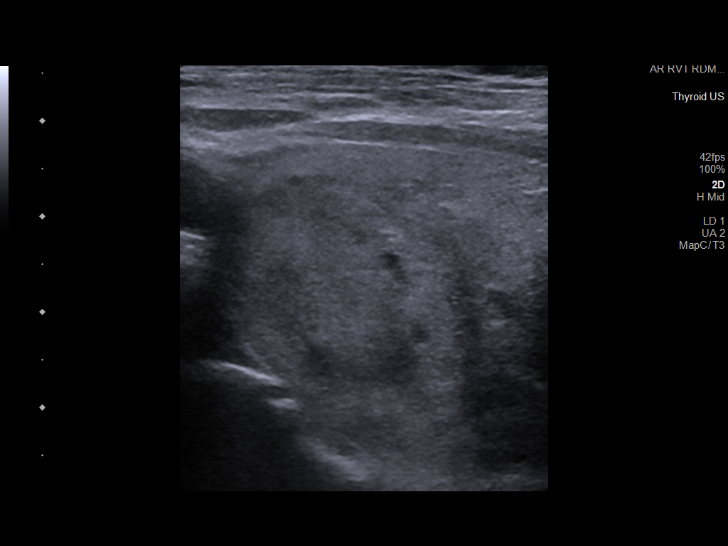
[im 2/15]
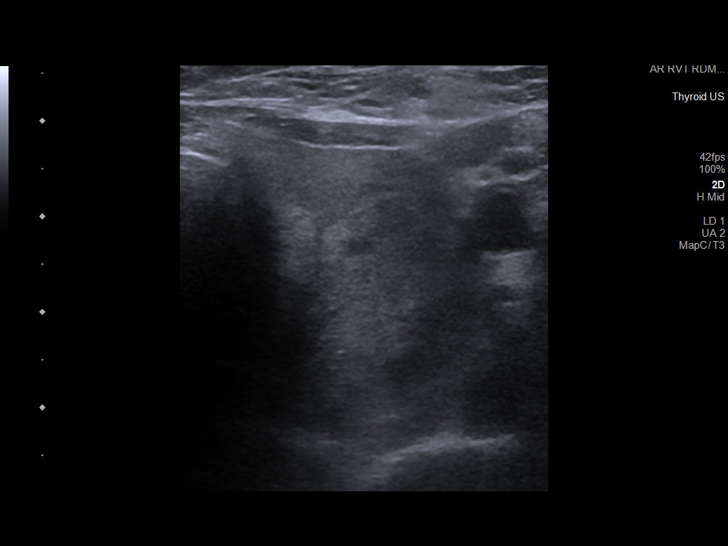
[im 3/15]
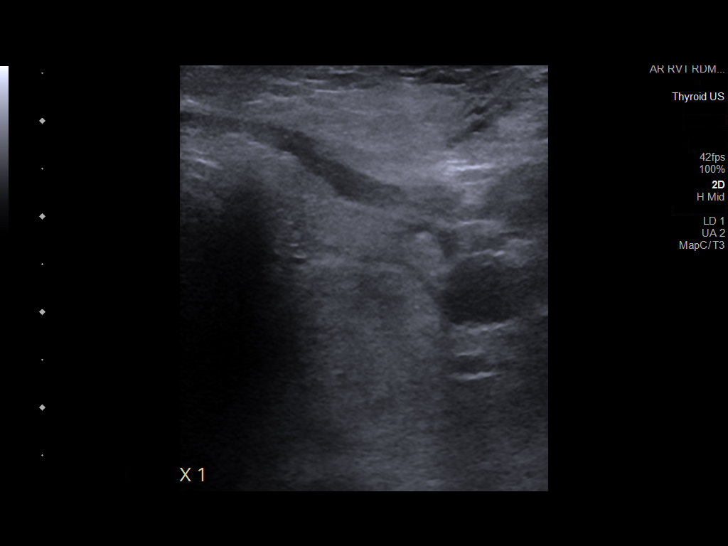
[im 5/15]
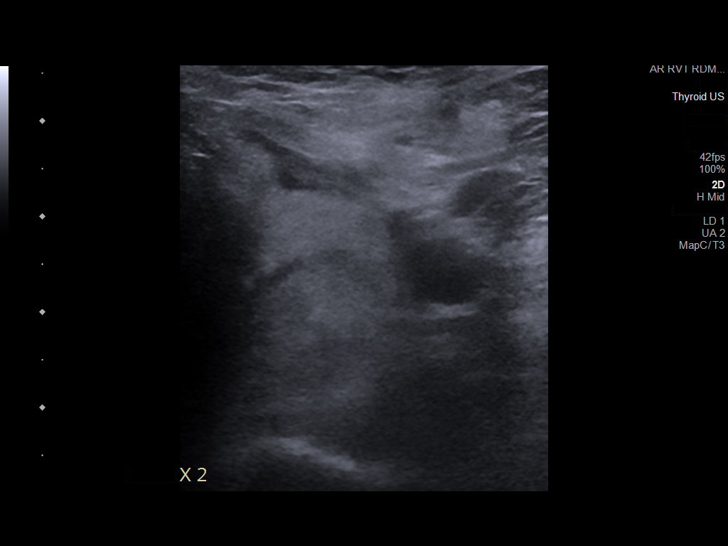
[im 6/15]
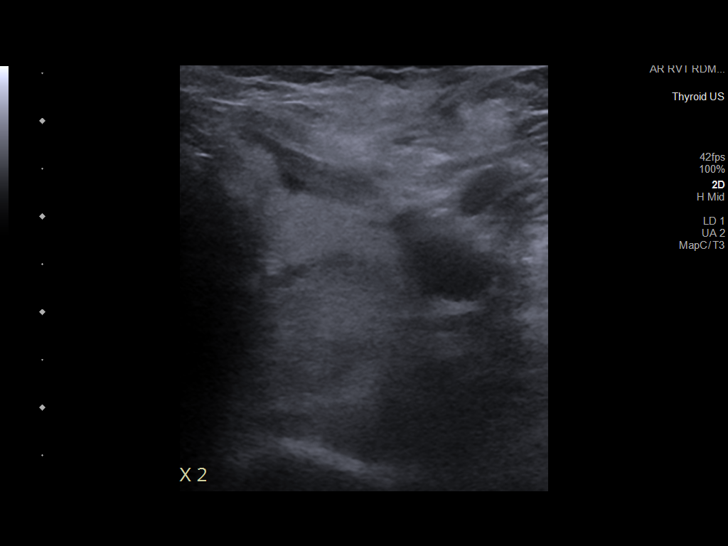
[im 7/15]
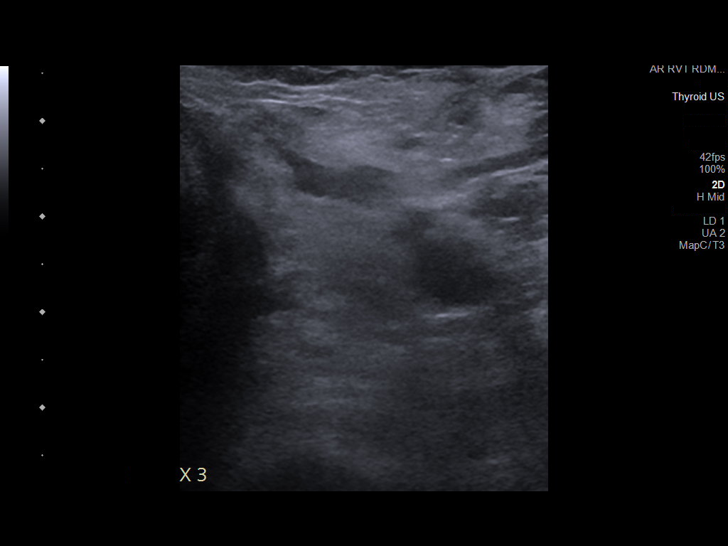
[im 8/15]
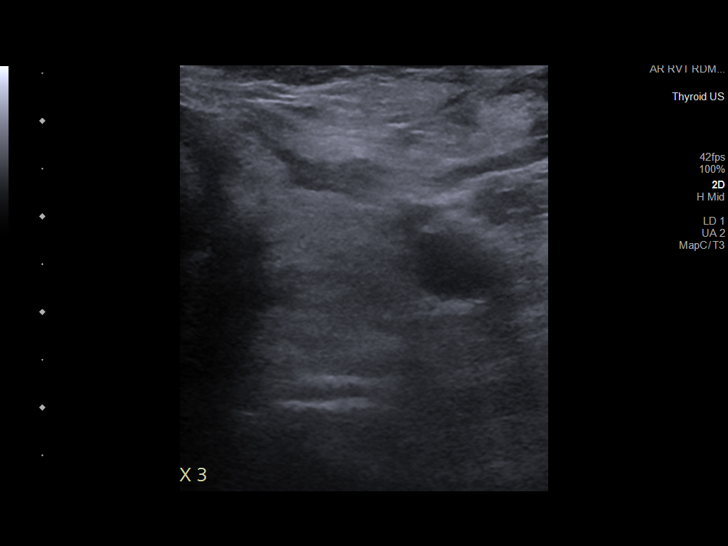
[im 9/15]
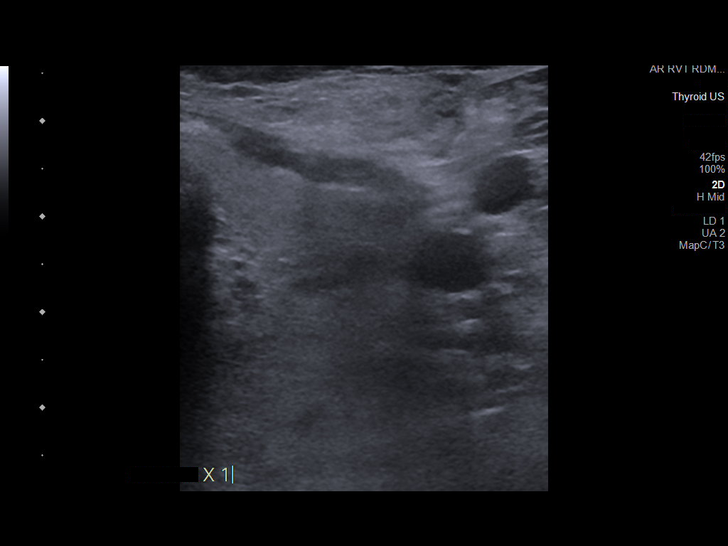
[im 10/15]
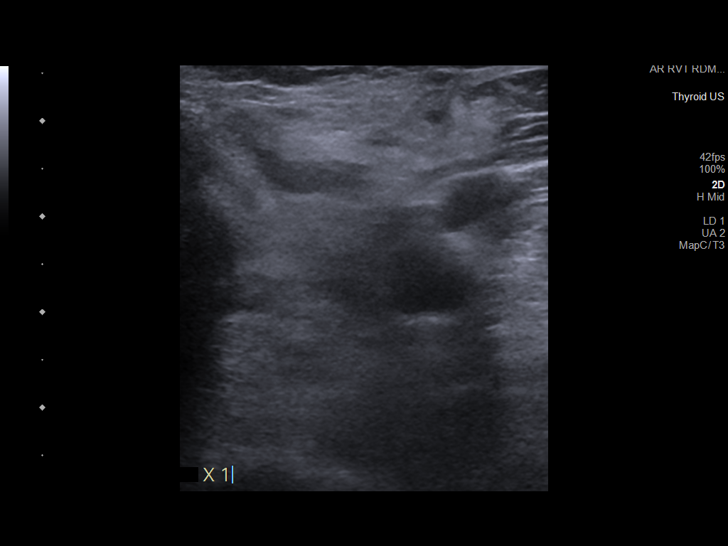
[im 11/15]
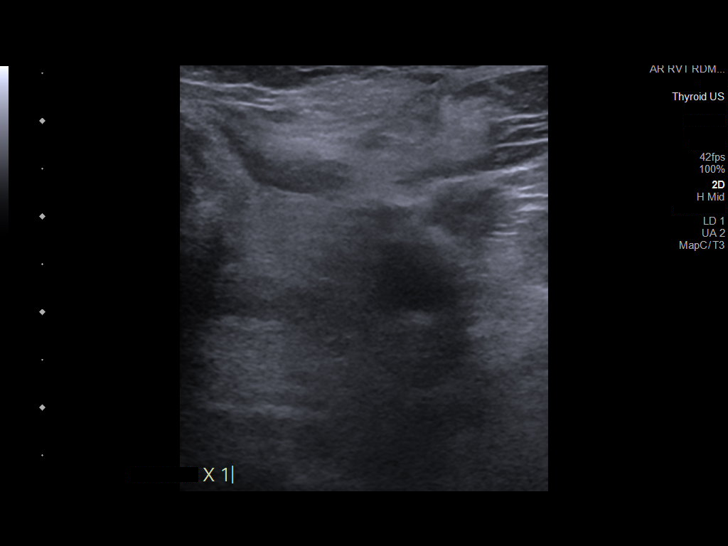
[im 13/15]
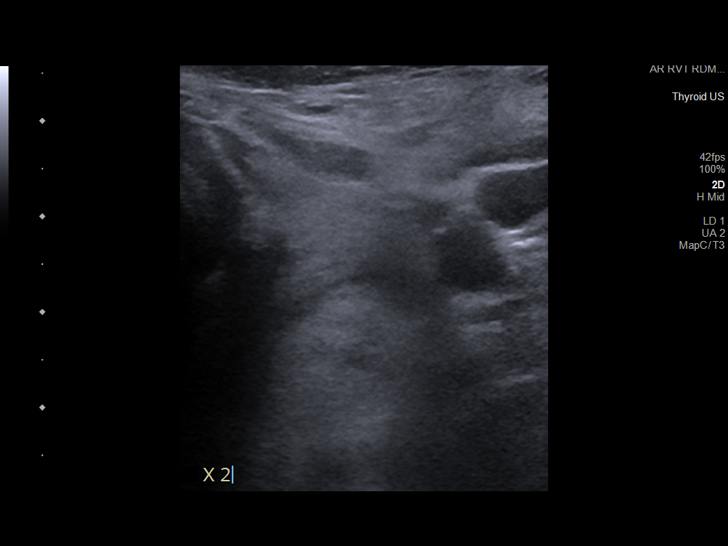
[im 14/15]
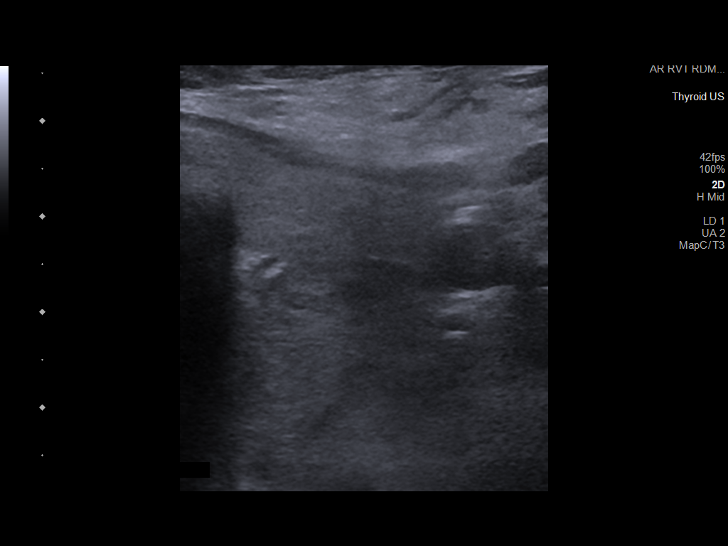
[im 15/15]
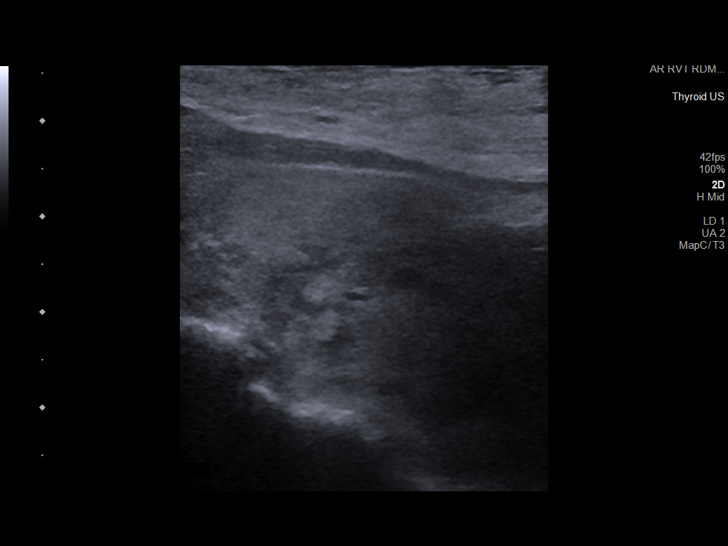

[13 of 15 positions shown; findings below may reference images not displayed]

Pre-procedural ultrasound scanning demonstrated unchanged size and
appearance of the indeterminate nodule within the left thyroid

The procedure was planned. The neck was prepped in the usual sterile
fashion, and a sterile drape was applied covering the operative
field. A timeout was performed prior to the initiation of the
procedure. Local anesthesia was provided with 1% lidocaine.

Under direct ultrasound guidance, 5 FNA biopsies were performed of
the left inferior thyroid nodule with a 25 gauge needle.

2 of these samples were obtained for AFIRMA

Multiple ultrasound images were saved for procedural documentation
purposes. The samples were prepared and submitted to pathology.

Limited post procedural scanning was negative for hematoma or
additional complication. Dressings were placed. The patient
tolerated the above procedures procedure well without immediate
postprocedural complication.
FINDINGS: Nodule reference number based on prior diagnostic ultrasound: 3

Maximum size: 3.2 cm

Location: Left; Inferior

ACR TI-RADS risk category: TR3 (3 points)

Reason for biopsy: meets ACR TI-RADS criteria

Ultrasound imaging confirms appropriate placement of the needles
within the thyroid nodule.
IMPRESSION: Technically successful ultrasound guided fine needle aspiration of
left inferior thyroid nodule

Read by

Viky Nose

## 2023-04-30 ENCOUNTER — Other Ambulatory Visit: Payer: Self-pay | Admitting: *Deleted

## 2023-04-30 ENCOUNTER — Telehealth: Payer: Self-pay | Admitting: "Endocrinology

## 2023-04-30 DIAGNOSIS — E049 Nontoxic goiter, unspecified: Secondary | ICD-10-CM

## 2023-04-30 NOTE — Telephone Encounter (Signed)
Labs have been updated . 

## 2023-04-30 NOTE — Telephone Encounter (Signed)
 Pt needs labs updated

## 2023-06-04 LAB — T4, FREE: Free T4: 1.64 ng/dL (ref 0.82–1.77)

## 2023-06-04 LAB — TSH: TSH: 1.2 u[IU]/mL (ref 0.450–4.500)

## 2023-06-04 LAB — T3, FREE: T3, Free: 3 pg/mL (ref 2.0–4.4)

## 2023-06-10 ENCOUNTER — Encounter: Payer: Self-pay | Admitting: "Endocrinology

## 2023-06-10 ENCOUNTER — Ambulatory Visit: Payer: Medicare HMO | Admitting: "Endocrinology

## 2023-06-10 VITALS — BP 128/76 | HR 88 | Wt 134.6 lb

## 2023-06-10 DIAGNOSIS — E049 Nontoxic goiter, unspecified: Secondary | ICD-10-CM | POA: Diagnosis not present

## 2023-06-10 NOTE — Progress Notes (Signed)
 06/10/2023, 2:03 PM   Endocrinology follow-up note  Subjective:    Patient ID: Rhonda Briggs, female    DOB: [birth year], PCP Malka Sea, DO   Past Medical History:  Diagnosis Date   Anemia    Arthritis    Asthma    COPD (chronic obstructive pulmonary disease) (HCC)    History of kidney stones    Hyperlipidemia    Hypertension    Osteoporosis    PONV (postoperative nausea and vomiting)    after hernia surgery   Past Surgical History:  Procedure Laterality Date   BIOPSY  03/18/2021   Procedure: BIOPSY;  Surgeon: Urban Garden, MD;  [facility];  Service: Gastroenterology;;   Ritta Chessman RELEASE Right 2011   CATARACT EXTRACTION Bilateral 01/2023   COLONOSCOPY WITH PROPOFOL  N/A 03/18/2021   Procedure: COLONOSCOPY WITH PROPOFOL ;  Surgeon: Urban Garden, MD;  [facility];  Service: Gastroenterology;  Laterality: N/A;   COLONOSCOPY WITH PROPOFOL  N/A 06/06/2021   Procedure: COLONOSCOPY WITH PROPOFOL ;  Surgeon: Vinetta Greening, DO;  [facility];  Service: Endoscopy;  Laterality: N/A;  2:00pm   ESOPHAGOGASTRODUODENOSCOPY (EGD) WITH PROPOFOL  N/A 03/18/2021   Procedure: ESOPHAGOGASTRODUODENOSCOPY (EGD) WITH PROPOFOL ;  Surgeon: Urban Garden, MD;  [facility];  Service: Gastroenterology;  Laterality: N/A;   ESOPHAGOGASTRODUODENOSCOPY (EGD) WITH PROPOFOL  N/A 06/06/2021   Procedure: ESOPHAGOGASTRODUODENOSCOPY (EGD) WITH PROPOFOL ;  Surgeon: Vinetta Greening, DO;  [facility];  Service: Endoscopy;  Laterality: N/A;   GIVENS CAPSULE STUDY N/A 03/19/2021   Procedure: GIVENS CAPSULE STUDY;  Surgeon: Ruby Corporal, MD;  [facility];  Service: Endoscopy;  Laterality: N/A;   HERNIA REPAIR Left    inguinal   TOTAL KNEE ARTHROPLASTY Right 08/19/2016   Procedure: TOTAL KNEE ARTHROPLASTY;  Surgeon: Darrin Emerald, MD;   Location: [facility];  Service: Orthopedics;  Laterality: Right;   TOTAL KNEE ARTHROPLASTY Left 12/31/2016   Procedure: TOTAL KNEE ARTHROPLASTY;  Surgeon: Darrin Emerald, MD;  Location: [facility];  Service: Orthopedics;  Laterality: Left;   TUBAL LIGATION     Social History   Socioeconomic History   Marital status: Widowed    Spouse name: Not on file   Number of children: Not on file   Years of education: Not on file   Highest education level: Not on file  Occupational History   Not on file  Tobacco Use   Smoking status: Every Day    Current packs/day: 0.50    Average packs/day: 0.5 packs/day for 48.0 years (24.0 ttl pk-yrs)    Types: Cigarettes   Smokeless tobacco: Never   Tobacco comments:    smokes 10 cigarettes per day 03/05/2020  Vaping Use   Vaping status: Never Used  Substance and Sexual Activity   Alcohol use: No   Drug use: No   Sexual activity: Yes    Birth control/protection: Surgical  Other Topics Concern   Not on file  Social History Narrative   Not on file   Social Drivers of Health   Financial Resource Strain: Not on file  Food Insecurity: Not on file  Transportation Needs: Not on file  Physical Activity: Not on  file  Stress: Not on file  Social Connections: Unknown (05/06/2022)   Received from [facility]   Social Network    Social Network: Not on file   Family History  Problem Relation Age of Onset   Hypertension Mother    Lung disease Mother    Cancer Father    Urticaria Neg Hx    Eczema Neg Hx    Immunodeficiency Neg Hx    Atopy Neg Hx    Asthma Neg Hx    Angioedema Neg Hx    Allergic rhinitis Neg Hx    Outpatient Encounter Medications as of 06/10/2023  Medication Sig   methotrexate (RHEUMATREX) 2.5 MG tablet Take 10 mg by mouth once a week.   ondansetron  (ZOFRAN -ODT) 4 MG disintegrating tablet Take 4 mg by mouth as needed.   alendronate (FOSAMAX) 70 MG tablet Take 70 mg by mouth once a week.   ferrous sulfate  325 (65 FE)  MG tablet Take 1 tablet (325 mg total) by mouth 2 (two) times daily with a meal.   folic acid  (FOLVITE ) 1 MG tablet Take 1 tablet (1 mg total) by mouth daily.   gabapentin  (NEURONTIN ) 100 MG capsule Take 100 mg by mouth in the morning, at noon, and at bedtime.   hydroxychloroquine (PLAQUENIL) 200 MG tablet Take 200 mg by mouth 2 (two) times daily.   INCRUSE ELLIPTA  62.5 MCG/ACT AEPB Inhale 1 puff into the lungs daily.   methylPREDNISolone  (MEDROL  DOSEPAK) 4 MG TBPK tablet Medrol  Dosepak take as instructed   Multiple Vitamins-Minerals (CENTRUM SILVER 50+WOMEN PO) Take 1 tablet by mouth daily in the afternoon. With vitamin D   pantoprazole  (PROTONIX ) 40 MG tablet Take 1 tablet (40 mg total) by mouth 2 (two) times daily. (Patient taking differently: Take 40 mg by mouth daily.)   pramipexole  (MIRAPEX ) 0.75 MG tablet Take 0.75 mg by mouth daily.   pravastatin  (PRAVACHOL ) 20 MG tablet Take 20 mg by mouth daily.   VENTOLIN  HFA 108 (90 Base) MCG/ACT inhaler Inhale 2 puffs into the lungs every 6 (six) hours as needed for wheezing or shortness of breath.   [DISCONTINUED] Adalimumab (HUMIRA, 2 PEN,) 40 MG/0.4ML PNKT every 14 (fourteen) days.   No facility-administered encounter medications on file as of 06/10/2023.   ALLERGIES: Allergies  Allergen Reactions   Amoxicillin  Nausea And Vomiting    VACCINATION STATUS: Immunization History  Administered Date(s) Administered   Fluad Quad(high Dose 65+) 11/16/2019   Moderna Sars-Covid-2 Vaccination 02/17/2019, 03/15/2019   Pneumococcal Polysaccharide-23 11/16/2019    HPI Rhonda Briggs is 73 y.o. female who presents today with a medical history as above. she is being seen in follow-up with repeat thyroid function tests after this patient was seen for nodular goiter.  Prior to her last visit, she underwent fine-needle aspiration of a thyroid nodule with benign findings. PCP:  Malka Sea, DO. See notes from the previous visit.  She was found to  have nodular goiter based on ultrasound in February 2022.  3.2 cm suspicious nodule from the left lobe was biopsied on October 09, 2020.  Biopsy findings were consistent with atypia of undetermined significance.  A sample was sent for Afirma molecular testing which returned approximately 4% risk of malignancy.  This was considered a benign finding.  She presents with previsit thyroid function test still showing euthyroid state.  She has no new complaints today.     She remains a chronic smoker.   She denies family history of thyroid malignancy.  She reports cough, wheezing, and dyspnea on exertion.  She denies dysphagia.    She also gives history of prediabetes in the past.  She is not on any intervention.   Review of Systems  Constitutional: + Minimally fluctuating body weight, +fatigue, no subjective hyperthermia, no subjective hypothermia   Objective:       06/10/2023    1:23 PM 06/10/2022   10:31 AM 02/03/2022   12:50 PM  Vitals with BMI  Height  5\' 0"    Weight 134 lbs 10 oz 139 lbs   BMI  27.15   Systolic 128 130 914  Diastolic 76 88 63  Pulse 88 88 98    BP 128/76   Pulse 88   Wt 134 lb 9.6 oz (61.1 kg)   BMI 26.29 kg/m   Wt Readings from Last 3 Encounters:  06/10/23 134 lb 9.6 oz (61.1 kg)  06/10/22 139 lb (63 kg)  01/30/22 155 lb 13.8 oz (70.7 kg)    Physical Exam  Constitutional:  Body mass index is 26.29 kg/m.,  not in acute distress, normal state of mind Eyes: PERRLA, EOMI, no exophthalmos ENT: moist mucous membranes, no gross thyromegaly, no gross cervical lymphadenopathy   CMP ( most recent) CMP     Component Value Date/Time   NA 135 02/03/2022 0413   K 3.2 (L) 02/03/2022 0413   CL 94 (L) 02/03/2022 0413   CO2 30 02/03/2022 0413   GLUCOSE 148 (H) 02/03/2022 0413   BUN 20 02/03/2022 0413   CREATININE 1.06 (H) 02/03/2022 0413   CALCIUM 8.2 (L) 02/03/2022 0413   PROT 6.1 (L) 02/02/2022 0633   ALBUMIN 2.8 (L) 02/02/2022 0633   AST 17 02/02/2022  0633   ALT 18 02/02/2022 0633   ALKPHOS 53 02/02/2022 0633   BILITOT 0.6 02/02/2022 0633   GFRNONAA 56 (L) 02/03/2022 0413   GFRAA 60 (L) 01/01/2017 0427    Latest Reference Range & Units 11/11/20 14:15 05/14/21 13:49  TSH 0.450 - 4.500 uIU/mL 0.825 0.997  Triiodothyronine,Free,Serum 2.0 - 4.4 pg/mL 3.0 2.6  T4,Free(Direct) 0.82 - 1.77 ng/dL 7.82 (H) 9.56  Thyroperoxidase Ab SerPl-aCnc 0 - 34 IU/mL 10   Thyroglobulin Antibody 0.0 - 0.9 IU/mL <1.0   (H): Data is abnormally high  Thyroid ultrasound from March 14, 2020: Right lobe 3.6 cm with no discrete nodule.  Left lobe 4.7 cm with 3.2 cm suspicious nodule.  Fine-needle aspiration on October 09, 2020-atypia of undetermined significance. Afirma molecular study  ~4% risk of malignancy     Assessment & Plan:   1. Nodular goiter  Her recent fine-needle aspiration biopsy was consistent with benign findings.  She presents with euthyroid state.  She will not need intervention with thyroid hormone or antithyroid prescription at this time.     - I have reviewed her available thyroid records and clinically evaluated the patient.  These findings were reviewed with her.  She will not need intervention with thyroid hormone nor antithyroid intervention at this time.    She is advised to continue follow-up with her PCP.  She will return to clinic only as needed. Regarding her reported history of prediabetes: - she acknowledges that there is a room for improvement in her food and drink choices. - Suggestion is made for her to avoid simple carbohydrates  from her diet including Cakes, Sweet Desserts, Ice Cream, Soda (diet and regular), Sweet Tea, Candies, Chips, Cookies, Store Bought Juices, Alcohol in Excess of  1-2 drinks a day, Artificial Sweeteners,  Coffee Creamer, and "Sugar-free" Products, Lemonade. This will help patient to have more stable blood glucose profile and potentially avoid unintended weight gain. 1 week to review her repeat  labs.   The single most important medical decision for her will be smoking cessation.  She is in the precontemplative phase.  Will need continuous, subsequent follow-up.  The patient was counseled on the dangers of tobacco use, and was advised to quit.  Reviewed strategies to maximize success, including removing cigarettes and smoking materials from environment.  - she is advised to maintain close follow up with Mullis, Kiersten P, DO for primary care needs.    Follow up plan: Return if symptoms worsen or fail to improve.   Kalvin Orf, MD [facility] [address] [place], [place] [address] Phone: [phone]  Fax: [phone]     06/10/2023, 2:03 PM  This note was partially dictated with voice recognition software. Similar sounding words can be transcribed inadequately or may not  be corrected upon review.

## 2023-09-20 ENCOUNTER — Ambulatory Visit (INDEPENDENT_AMBULATORY_CARE_PROVIDER_SITE_OTHER)

## 2023-09-20 ENCOUNTER — Ambulatory Visit: Admitting: Podiatry

## 2023-09-20 ENCOUNTER — Encounter: Payer: Self-pay | Admitting: Podiatry

## 2023-09-20 VITALS — Ht 60.0 in | Wt 134.6 lb

## 2023-09-20 DIAGNOSIS — M21611 Bunion of right foot: Secondary | ICD-10-CM | POA: Diagnosis not present

## 2023-09-20 DIAGNOSIS — M21619 Bunion of unspecified foot: Secondary | ICD-10-CM

## 2023-09-20 DIAGNOSIS — M7751 Other enthesopathy of right foot: Secondary | ICD-10-CM

## 2023-09-20 MED ORDER — TRIAMCINOLONE ACETONIDE 10 MG/ML IJ SUSP
10.0000 mg | Freq: Once | INTRAMUSCULAR | Status: AC
  Administered 2023-09-20: 10 mg via INTRA_ARTICULAR

## 2023-09-22 NOTE — Progress Notes (Signed)
 Subjective:   Patient ID: Rhonda Briggs, female   DOB: 73 y.o.   MRN: [id number]   HPI Patient presents with caregiver with severe bunion deformity right foot with the second toe laid over the big toe that is really bothering her history of arthritis COPD and still does smoke half a pack per day.  States it is very difficult to wear shoes and to be comfortable to any degree   Review of Systems  All other systems reviewed and are negative.       Objective:  Physical Exam Vitals and nursing note reviewed.  Constitutional:      Appearance: She is well-developed.  Pulmonary:     Effort: Pulmonary effort is normal.  Musculoskeletal:        General: Normal range of motion.  Skin:    General: Skin is warm.  Neurological:     Mental Status: She is alert.     Neurovascular status was found to be intact muscle strength is found to be adequate range of motion subtalar midtarsal joint adequate.  Severe forefoot derangement right with complete lateral movement of the right hallux elevation of the lesser digit and very prominent metatarsal heads consistent with rheumatoid type foot structure.  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  Patient is smoking approximate 10 cigarettes a day and I would like her to reduce that and she promises to at this time and I discussed first MPJ fusion with probability for rheumatoid Rosan Ina procedure.  Patient is willing to undergo this and I will refer this to appropriate provider.  All questions answered today  X-rays indicate severe arthritic bunion deformity right with hallux which is not articulating properly with first metatarsal head with severe digital deformity and metatarsal  deformity

## 2023-10-02 IMAGING — NM NM GI BLOOD LOSS
2 series · 12 of 12 positions shown · non-contrast
Comparison: None.

CLINICAL DATA: GI bleeding for several days

EXAM:
NUCLEAR MEDICINE GASTROINTESTINAL BLEEDING SCAN
TECHNIQUE: Sequential abdominal images were obtained following intravenous
administration of Oc-KKm labeled red blood cells.
RADIOPHARMACEUTICALS:  24 mCi Oc-KKm pertechnetate in-vitro labeled
red cells.

[Series 1: gi bleed · 4.14mm/px · 6 of 60 frames shown (1 of 2)]
[frame 6/60]
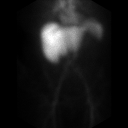
[frame 16/60]
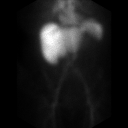
[frame 26/60]
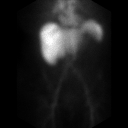
[frame 36/60]
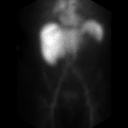
[frame 46/60]
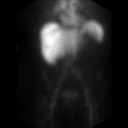
[frame 56/60]
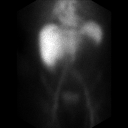

[Series 2: gi bleed · 4.14mm/px · 6 of 60 frames shown (2 of 2)]
[frame 6/60]
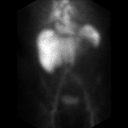
[frame 16/60]
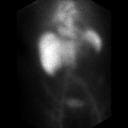
[frame 26/60]
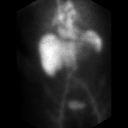
[frame 36/60]
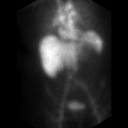
[frame 46/60]
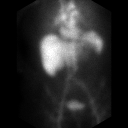
[frame 56/60]
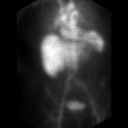

[12 of 12 positions shown; findings below may reference images not displayed]

FINDINGS: Adequate uptake is noted throughout the cardiac blood pool, liver
and spleen. Vascular structures are noted. Pooling in the urinary
bladder is seen. No focal area of increased activity is identified
to suggest active GI hemorrhage.
IMPRESSION: No findings to suggest active GI hemorrhage.

## 2023-11-15 ENCOUNTER — Telehealth: Payer: Self-pay

## 2023-11-15 NOTE — Telephone Encounter (Signed)
 Patient left message asking for you to give her a call at [phone].

## 2023-11-15 NOTE — Telephone Encounter (Signed)
 I called her to see what she needs  2018 knee replacement left  She has a place that is puffy and painful hurts to extend her knee  She needs to be worked in

## 2023-11-16 NOTE — Telephone Encounter (Signed)
 She returned my call and is scheduled for Friday 11/26/23

## 2023-11-25 ENCOUNTER — Other Ambulatory Visit (HOSPITAL_COMMUNITY): Payer: Self-pay | Admitting: Family Medicine

## 2023-11-25 DIAGNOSIS — R6889 Other general symptoms and signs: Secondary | ICD-10-CM

## 2023-11-26 ENCOUNTER — Other Ambulatory Visit (INDEPENDENT_AMBULATORY_CARE_PROVIDER_SITE_OTHER): Payer: Self-pay

## 2023-11-26 ENCOUNTER — Ambulatory Visit: Admitting: Orthopedic Surgery

## 2023-11-26 VITALS — Ht 60.0 in | Wt 136.0 lb

## 2023-11-26 DIAGNOSIS — Z96652 Presence of left artificial knee joint: Secondary | ICD-10-CM | POA: Diagnosis not present

## 2023-11-26 DIAGNOSIS — M545 Low back pain, unspecified: Secondary | ICD-10-CM | POA: Diagnosis not present

## 2023-11-26 DIAGNOSIS — M25562 Pain in left knee: Secondary | ICD-10-CM | POA: Diagnosis not present

## 2023-11-26 DIAGNOSIS — M5410 Radiculopathy, site unspecified: Secondary | ICD-10-CM

## 2023-11-26 DIAGNOSIS — Z96651 Presence of right artificial knee joint: Secondary | ICD-10-CM

## 2023-11-26 MED ORDER — PREDNISONE 10 MG (48) PO TBPK: ORAL_TABLET | Freq: Every day | ORAL | 0 refills | Status: AC

## 2023-11-26 NOTE — Progress Notes (Signed)
   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  Presents with 2-week history of pain on the anterolateral side of the left leg  Review of systems reveals the patient has rheumatoid arthritis which has gotten worse over the years  The pain in the knee is actually improving  She localizes the pain to the anterolateral left knee near Gertie's tubercle radiates across the front of the knee at times and then down the left leg  Exam findings include the following  0-1 20 range of motion left knee no instability  Tenderness over the Gertie's tubercle lateral thigh back and lateral leg into the foot  DG Knee AP/LAT W/Sunrise Left Result Date: 11/26/2023 Left leg pain previous total knee in 2018 Sigma fixed-bearing posterior stabilized total knee arthroplasties Overall limb alignment valgus on the right varus on the left tibiofemoral angle no signs of loosening of the implants no sign of infection Impression normal implants right and left knee, the patella does not look to be replaced on the left knee   DG Lumbar Spine 2-3 Views Result Date: 11/26/2023 Spinal imaging.  Left leg pain Patient has a spondylolisthesis at L5-S1 increased lumbar lordosis with minimal degenerative disc changes and moderate facet arthritis On the AP view the coronal plane alignment shows a mild scoliosis lumbar with a dextro curve Impression spondylolisthesis L5-S1 facet arthritis scoliosis     Assessment and plan  Probable radicular pain left leg  Implants look stable  Recommend gabapentin  or prednisone  depending on patient's preference  Return as needed  Encounter Diagnoses  Name Primary?   Acute pain of left knee Yes   Radicular leg pain    Meds ordered this encounter  Medications   predniSONE  (STERAPRED UNI-PAK 48 TAB) 10 MG (48) TBPK tablet    Sig:  Take by mouth daily. 10mg  12 days as directed    Dispense:  48 tablet    Refill:  0

## 2023-11-26 NOTE — Progress Notes (Signed)
  Intake history:  No chief complaint on file.    Ht 5' (1.524 m)   Wt 136 lb (61.7 kg)   BMI 26.56 kg/m  Body mass index is 26.56 kg/m.  Pharmacy? _[facility] [place]__  WHAT ARE WE SEEING YOU FOR TODAY?   left knee(s)  How long has this bothered you? (DOI?DOS?WS?)  2 week(s) ago  Was there an injury? No  Anticoag.  No   Any ALLERGIES ______________________________________________   Treatment:  Have you taken:  Tylenol  No  Advil Yes  Had PT No  Had injection No  Other  _________________________

## 2023-11-26 NOTE — Patient Instructions (Signed)
 Take the medication until symptoms resolve  This should not require surgery  The most likely scenario is that the pain is generated in the lower back affecting one of the nerves that go into the knee and the leg causing the symptoms

## 2023-11-29 ENCOUNTER — Ambulatory Visit (HOSPITAL_COMMUNITY)
Admission: RE | Admit: 2023-11-29 | Discharge: 2023-11-29 | Disposition: A | Discharge status: Home / Self Care | Source: Ambulatory Visit | Attending: Family Medicine | Admitting: Family Medicine

## 2023-11-29 DIAGNOSIS — R6889 Other general symptoms and signs: Secondary | ICD-10-CM | POA: Diagnosis present

## 2024-03-22 ENCOUNTER — Ambulatory Visit (HOSPITAL_COMMUNITY)
